# Patient Record
Sex: Male | Born: 1960 | Race: White | Hispanic: No | Smoking: Former smoker
Health system: Southern US, Community
[De-identification: ages and names within clinical notes are randomized; demographics above are authoritative.]

## PROBLEM LIST (undated history)

## (undated) DIAGNOSIS — G8929 Other chronic pain: Secondary | ICD-10-CM

## (undated) DIAGNOSIS — B372 Candidiasis of skin and nail: Secondary | ICD-10-CM

## (undated) DIAGNOSIS — E785 Hyperlipidemia, unspecified: Secondary | ICD-10-CM

## (undated) DIAGNOSIS — E7800 Pure hypercholesterolemia, unspecified: Secondary | ICD-10-CM

## (undated) DIAGNOSIS — IMO0002 Reserved for concepts with insufficient information to code with codable children: Secondary | ICD-10-CM

## (undated) DIAGNOSIS — R51 Headache: Secondary | ICD-10-CM

## (undated) DIAGNOSIS — K436 Other and unspecified ventral hernia with obstruction, without gangrene: Secondary | ICD-10-CM

## (undated) DIAGNOSIS — I10 Essential (primary) hypertension: Secondary | ICD-10-CM

## (undated) DIAGNOSIS — E1165 Type 2 diabetes mellitus with hyperglycemia: Secondary | ICD-10-CM

## (undated) DIAGNOSIS — M25551 Pain in right hip: Secondary | ICD-10-CM

## (undated) DIAGNOSIS — G629 Polyneuropathy, unspecified: Secondary | ICD-10-CM

## (undated) DIAGNOSIS — K219 Gastro-esophageal reflux disease without esophagitis: Secondary | ICD-10-CM

## (undated) DIAGNOSIS — M5431 Sciatica, right side: Secondary | ICD-10-CM

## (undated) DIAGNOSIS — F419 Anxiety disorder, unspecified: Secondary | ICD-10-CM

## (undated) DIAGNOSIS — G40909 Epilepsy, unspecified, not intractable, without status epilepticus: Secondary | ICD-10-CM

## (undated) DIAGNOSIS — M25539 Pain in unspecified wrist: Secondary | ICD-10-CM

## (undated) DIAGNOSIS — R519 Headache, unspecified: Secondary | ICD-10-CM

## (undated) DIAGNOSIS — M5416 Radiculopathy, lumbar region: Secondary | ICD-10-CM

## (undated) DIAGNOSIS — E1140 Type 2 diabetes mellitus with diabetic neuropathy, unspecified: Secondary | ICD-10-CM

## (undated) HISTORY — DX: Other chronic pain: G89.29

## (undated) HISTORY — DX: Pure hypercholesterolemia, unspecified: E78.00

## (undated) HISTORY — DX: Pain in unspecified wrist: M25.539

## (undated) HISTORY — DX: Essential (primary) hypertension: I10

## (undated) HISTORY — DX: Candidiasis of skin and nail: B37.2

## (undated) HISTORY — DX: Hyperlipidemia, unspecified: E78.5

## (undated) HISTORY — DX: Pain in right hip: M25.551

## (undated) HISTORY — DX: Radiculopathy, lumbar region: M54.16

## (undated) HISTORY — DX: Headache, unspecified: R51.9

## (undated) HISTORY — DX: Type 2 diabetes mellitus with diabetic neuropathy, unspecified: E11.40

## (undated) HISTORY — DX: Other and unspecified ventral hernia with obstruction, without gangrene: K43.6

## (undated) HISTORY — DX: Sciatica, right side: M54.31

## (undated) HISTORY — DX: Reserved for concepts with insufficient information to code with codable children: IMO0002

## (undated) HISTORY — DX: Epilepsy, unspecified, not intractable, without status epilepticus: G40.909

## (undated) HISTORY — DX: Type 2 diabetes mellitus with hyperglycemia: E11.65

## (undated) HISTORY — DX: Polyneuropathy, unspecified: G62.9

## (undated) HISTORY — DX: Headache: R51

## (undated) HISTORY — PX: HERNIA REPAIR: SHX51

## (undated) HISTORY — DX: Gastro-esophageal reflux disease without esophagitis: K21.9

## (undated) HISTORY — DX: Anxiety disorder, unspecified: F41.9

## (undated) HISTORY — PX: OTHER SURGICAL HISTORY: SHX169

---

## 2003-07-02 ENCOUNTER — Inpatient Hospital Stay (HOSPITAL_COMMUNITY): Admission: AC | Admit: 2003-07-02 | Discharge: 2003-07-07 | Payer: Self-pay

## 2003-07-02 ENCOUNTER — Encounter: Payer: Self-pay | Admitting: Emergency Medicine

## 2015-02-23 ENCOUNTER — Encounter: Payer: Self-pay | Admitting: Critical Care Medicine

## 2015-02-24 ENCOUNTER — Ambulatory Visit (INDEPENDENT_AMBULATORY_CARE_PROVIDER_SITE_OTHER): Payer: Self-pay | Admitting: Critical Care Medicine

## 2015-02-24 ENCOUNTER — Telehealth: Payer: Self-pay | Admitting: Critical Care Medicine

## 2015-02-24 ENCOUNTER — Encounter: Payer: Self-pay | Admitting: Critical Care Medicine

## 2015-02-24 VITALS — BP 140/76 | HR 81 | Temp 98.0°F | Ht 68.5 in | Wt 253.4 lb

## 2015-02-24 DIAGNOSIS — I10 Essential (primary) hypertension: Secondary | ICD-10-CM | POA: Insufficient documentation

## 2015-02-24 DIAGNOSIS — J449 Chronic obstructive pulmonary disease, unspecified: Secondary | ICD-10-CM

## 2015-02-24 DIAGNOSIS — R51 Headache: Secondary | ICD-10-CM

## 2015-02-24 DIAGNOSIS — E1140 Type 2 diabetes mellitus with diabetic neuropathy, unspecified: Secondary | ICD-10-CM | POA: Insufficient documentation

## 2015-02-24 DIAGNOSIS — G40909 Epilepsy, unspecified, not intractable, without status epilepticus: Secondary | ICD-10-CM | POA: Insufficient documentation

## 2015-02-24 DIAGNOSIS — E1165 Type 2 diabetes mellitus with hyperglycemia: Secondary | ICD-10-CM | POA: Insufficient documentation

## 2015-02-24 DIAGNOSIS — I2510 Atherosclerotic heart disease of native coronary artery without angina pectoris: Secondary | ICD-10-CM

## 2015-02-24 DIAGNOSIS — F17200 Nicotine dependence, unspecified, uncomplicated: Secondary | ICD-10-CM | POA: Insufficient documentation

## 2015-02-24 DIAGNOSIS — M5416 Radiculopathy, lumbar region: Secondary | ICD-10-CM

## 2015-02-24 DIAGNOSIS — G8929 Other chronic pain: Secondary | ICD-10-CM | POA: Insufficient documentation

## 2015-02-24 DIAGNOSIS — K219 Gastro-esophageal reflux disease without esophagitis: Secondary | ICD-10-CM

## 2015-02-24 DIAGNOSIS — E119 Type 2 diabetes mellitus without complications: Secondary | ICD-10-CM | POA: Insufficient documentation

## 2015-02-24 DIAGNOSIS — IMO0002 Reserved for concepts with insufficient information to code with codable children: Secondary | ICD-10-CM | POA: Insufficient documentation

## 2015-02-24 DIAGNOSIS — E7800 Pure hypercholesterolemia, unspecified: Secondary | ICD-10-CM

## 2015-02-24 DIAGNOSIS — G629 Polyneuropathy, unspecified: Secondary | ICD-10-CM | POA: Insufficient documentation

## 2015-02-24 DIAGNOSIS — G44049 Chronic paroxysmal hemicrania, not intractable: Secondary | ICD-10-CM

## 2015-02-24 DIAGNOSIS — E785 Hyperlipidemia, unspecified: Secondary | ICD-10-CM | POA: Insufficient documentation

## 2015-02-24 HISTORY — DX: Nicotine dependence, unspecified, uncomplicated: F17.200

## 2015-02-24 MED ORDER — BUDESONIDE-FORMOTEROL FUMARATE 160-4.5 MCG/ACT IN AERO: 2.0000 | INHALATION_SPRAY | Freq: Two times a day (BID) | RESPIRATORY_TRACT | Status: DC

## 2015-02-24 MED ORDER — ALBUTEROL SULFATE HFA 108 (90 BASE) MCG/ACT IN AERS: 2.0000 | INHALATION_SPRAY | Freq: Four times a day (QID) | RESPIRATORY_TRACT | Status: DC | PRN

## 2015-02-24 NOTE — Telephone Encounter (Signed)
Medication has been added to pt's medication list. Nothing further was needed. 

## 2015-02-24 NOTE — Progress Notes (Addendum)
Subjective:    Patient ID: Samuel Bailey, male    DOB: [birth year], 54 y.o.   MRN: [id number]  HPI Comments: Dyspnea cough for years. Long term Copd dx.  On 2L oxygen qhs only  x years.   Pt is now disabled d/t spine issues.  [employer] worker   Cath two weeks ago neg.  Weight is up  Shortness of Breath This is a chronic problem. The current episode started more than 1 year ago. The problem occurs constantly (pt will get dyspneic at rest and exertion.  177ft or less gets dyspneic. ). The problem has been waxing and waning. Associated symptoms include chest pain, rhinorrhea, sputum production and wheezing. Pertinent negatives include no abdominal pain, claudication, coryza, ear pain, fever, headaches, hemoptysis, leg pain, leg swelling or sore throat. The symptoms are aggravated by smoke, weather changes, fumes, odors, emotional upset, lying flat, any activity and exercise. Associated symptoms comments: Mucus is yellow . Risk factors include smoking (no smoking since 02/19/15). He has tried ipratropium inhalers, steroid inhalers and beta agonist inhalers for the symptoms. The treatment provided significant relief. His past medical history is significant for COPD and pneumonia. There is no history of allergies, aspirin allergies, asthma, bronchiolitis, CAD, chronic lung disease, DVT, a heart failure, PE or a recent surgery. (Cva, clean 31yrs, was using cocaine)   Past Medical History  Diagnosis Date  . Uncontrolled type 2 diabetes with neuropathy   . Wrist pain   . HTN (hypertension)   . Candidal intertrigo   . Lumbar radiculopathy   . Right hip pain   . Chronic pain   . Hyperlipemia   . Sciatica of right side   . Peripheral neuropathy   . Irreducible hernia of anterior abdominal wall   . Anxiety   . Seizure disorder   . GERD (gastroesophageal reflux disease)   . High cholesterol   . Chronic headaches      Family History  Problem Relation Age of Onset  . Prostate cancer Father   . Asthma  Mother   . Hypertension Mother   . Diabetes Mother   . CVA Mother   . Heart disease Mother   . Colon cancer Mother   . Diabetes Sister   . COPD Brother      History   Social History  . Marital Status: Legally Separated    Spouse Name: N/A  . Number of Children: N/A  . Years of Education: N/A   Occupational History  . Not on file.   Social History Main Topics  . Smoking status: Former Smoker -- 1.00 packs/day for 35 years    Types: Cigarettes    Quit date: 02/19/2015  . Smokeless tobacco: Current User  . Alcohol Use: 3.6 oz/week    6 Standard drinks or equivalent per week  . Drug Use: Not on file  . Sexual Activity: Not on file   Other Topics Concern  . Not on file   Social History Narrative   Separated. Lives alone.   Disabled.     Allergies  Allergen Reactions  . Lovastatin     diarrhea     No outpatient prescriptions prior to visit.   No facility-administered medications prior to visit.       Review of Systems  Constitutional: Negative for fever and unexpected weight change.  HENT: Positive for postnasal drip and rhinorrhea. Negative for ear discharge, ear pain, facial swelling, nosebleeds, sinus pressure, sneezing and sore throat.   Respiratory: Positive for  cough, sputum production, chest tightness, shortness of breath and wheezing. Negative for hemoptysis and choking.   Cardiovascular: Positive for chest pain. Negative for claudication and leg swelling.  Gastrointestinal: Negative.  Negative for abdominal pain.  Neurological: Negative for headaches.       Objective:   Physical Exam Filed Vitals:   02/24/15 0906  BP: 140/76  Pulse: 81  Temp: 98 F (36.7 C)  TempSrc: Oral  Height: 5' 8.5" (1.74 m)  Weight: 253 lb 6.4 oz (114.941 kg)  SpO2: 96%    Gen: Pleasant, well-nourished, in no distress,  normal affect  ENT: No lesions,  mouth clear,  oropharynx clear, no postnasal drip  Neck: No JVD, no TMG, no carotid bruits  Lungs: No use  of accessory muscles, no dullness to percussion, clear without rales or rhonchi  Cardiovascular: RRR, heart sounds normal, no murmur or gallops, no peripheral edema  Abdomen: soft and NT, no HSM,  BS normal  Musculoskeletal: No deformities, no cyanosis or clubbing  Neuro: alert, non focal  Skin: Warm, no lesions or rashes  No results found.  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 and long-term smoking use with recent smoking cessation No recent lung function studies available Current bronchodilator program involves several medications with similar actions with potential side effect profile increased Plan Stop Qvar in order to use steroid combined with long-acting bronchodilator Stop combivent as the ipratropium and Combivent will potentiate Spiriva with toxicity Start Proair as needed, use coupon Start Symbicort two puff twice daily Stay on spiriva A lung function study will be obtained  The patient has been advised to continue and persist with smoking cessation Note the patient is on Daliresp and has been advised to continue this medication Return 3 months      Tobacco use disorder Ongoing tobacco use Significant nicotine addiction Greater than 10 minutes smoking cessation counseling issued to the patient with recommendations to continue using nicotine replacement therapy     Updated Medication List Outpatient Encounter Prescriptions as of 02/24/2015  Medication Sig  . canagliflozin (INVOKANA) 300 MG TABS tablet Take 300 mg by mouth daily before breakfast.  . cyclobenzaprine (FLEXERIL) 10 MG tablet Take 10 mg by mouth 3 (three)  times daily as needed for muscle spasms.  . enalapril (VASOTEC) 20 MG tablet Take 20 mg by mouth daily.  Marland Kitchen. gabapentin (NEURONTIN) 300 MG capsule Take 300 mg by mouth 3 (three) times daily.  Marland Kitchen. gemfibrozil (LOPID) 600 MG tablet Take 600 mg by mouth 2 (two) times daily before a meal.  . HYDROcodone-acetaminophen (NORCO) 10-325 MG per tablet Take 1 tablet by mouth every 6 (six) hours as needed.  . insulin glargine (LANTUS) 100 unit/mL SOPN Inject 65 Units into the skin. Daily in morning.  . nitroGLYCERIN (NITROSTAT) 0.4 MG SL tablet Place 0.4 mg under the tongue every 5 (five) minutes as needed for chest pain.  Marland Kitchen. omeprazole (PRILOSEC) 20 MG capsule Take 20 mg by mouth daily.  Marland Kitchen. tiotropium (SPIRIVA) 18 MCG inhalation capsule Place 18 mcg into inhaler and inhale daily.  . [DISCONTINUED] albuterol-ipratropium (COMBIVENT) 18-103 MCG/ACT inhaler Inhale 2 puffs into the lungs every 6 (six) hours as needed for  wheezing or shortness of breath.  . [DISCONTINUED] beclomethasone (QVAR) 80 MCG/ACT inhaler Inhale 2 puffs into the lungs 2 (two) times daily.  Marland Kitchen albuterol (PROAIR HFA) 108 (90 BASE) MCG/ACT inhaler Inhale 2 puffs into the lungs every 6 (six) hours as needed for wheezing or shortness of breath.  . budesonide-formoterol (SYMBICORT) 160-4.5 MCG/ACT inhaler Inhale 2 puffs into the lungs 2 (two) times daily.

## 2015-02-24 NOTE — Patient Instructions (Signed)
Stop Qvar Stop [employer]combivent Start Proair as needed, use coupon [employer]Start Symbicort two puff twice daily Stay on spiriva A lung function study will be obtained  Return 3 months

## 2015-02-25 ENCOUNTER — Encounter: Payer: Self-pay | Admitting: Critical Care Medicine

## 2015-02-25 DIAGNOSIS — J449 Chronic obstructive pulmonary disease, unspecified: Secondary | ICD-10-CM

## 2015-02-25 DIAGNOSIS — J4489 Other specified chronic obstructive pulmonary disease: Secondary | ICD-10-CM

## 2015-02-25 DIAGNOSIS — I2510 Atherosclerotic heart disease of native coronary artery without angina pectoris: Secondary | ICD-10-CM | POA: Insufficient documentation

## 2015-02-25 HISTORY — DX: Atherosclerotic heart disease of native coronary artery without angina pectoris: I25.10

## 2015-02-25 HISTORY — DX: Other specified chronic obstructive pulmonary disease: J44.89

## 2015-02-25 HISTORY — DX: Chronic obstructive pulmonary disease, unspecified: J44.9

## 2015-02-25 NOTE — Assessment & Plan Note (Addendum)
COPD with chronic bronchitis and long-term smoking use with recent smoking cessation No recent lung function studies available Current bronchodilator program involves several medications with similar actions with potential side effect profile increased Plan Stop Qvar in order to use steroid combined with long-acting bronchodilator Stop combivent as the ipratropium and Combivent will potentiate Spiriva with toxicity Start Proair as needed, use coupon Start Symbicort two puff twice daily Stay on spiriva A lung function study will be obtained  The patient has been advised to continue and persist with smoking cessation Note the patient is on Daliresp and has been advised to continue this medication Return 3 months

## 2015-02-25 NOTE — Assessment & Plan Note (Signed)
Ongoing tobacco use Significant nicotine addiction Greater than 10 minutes smoking cessation counseling issued to the patient with recommendations to continue using nicotine replacement therapy

## 2015-03-03 LAB — PULMONARY FUNCTION TEST ARMC ONLY

## 2015-03-23 ENCOUNTER — Telehealth: Payer: Self-pay | Admitting: Critical Care Medicine

## 2015-03-23 DIAGNOSIS — J449 Chronic obstructive pulmonary disease, unspecified: Secondary | ICD-10-CM

## 2015-03-23 NOTE — Telephone Encounter (Signed)
Tell pt pfts show MILD obstruction, no emphysema Stay on inhalers.

## 2015-03-24 NOTE — Telephone Encounter (Signed)
Attempted to both numbers we have listed for the pt. 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

## 2015-03-25 NOTE — Telephone Encounter (Signed)
ATC pt's home # - received msg this # has been changed or disconnected.   ATC pt's # listed as work # - went directly to [employer] Corporationmsg stating "the person you have attempted to reach has a VM box that has not yet been set up." [facility]

## 2015-03-26 NOTE — Telephone Encounter (Signed)
ATC pt at home and work #s - received same msgs as stated below yesterday.  WCB

## 2015-03-31 NOTE — Telephone Encounter (Signed)
Spoke with pt one # provided below by [place].  Discussed PFT results and recs per Dr. Delford FieldWright.  He verbalized understanding and voiced no further questions or concerns at this time.  Confirmed pt's contact information and updated this in chart.

## 2015-03-31 NOTE — Telephone Encounter (Signed)
Per [place] office, they have pt's contact # as [phone] with this same # as Emergency Contact..  Will call pt at this #

## 2015-03-31 NOTE — Telephone Encounter (Signed)
ATC pt at home and work # - received same msgs as stated below. ATC pt's Emergency Contact, Jennette DubinShirley Thomas, at [phone], Received msg "Your call cannot be completed as dialed.  Please check the # and try your call again" x 2. As this is an [place] pt, will check with [place] office to see if they have a different contact # before mailing letter to pt.

## 2015-04-16 ENCOUNTER — Encounter: Payer: Self-pay | Admitting: Critical Care Medicine

## 2015-08-04 ENCOUNTER — Encounter: Payer: Self-pay | Admitting: Critical Care Medicine

## 2015-08-04 ENCOUNTER — Ambulatory Visit (INDEPENDENT_AMBULATORY_CARE_PROVIDER_SITE_OTHER): Payer: Self-pay | Admitting: Critical Care Medicine

## 2015-08-04 VITALS — BP 122/70 | HR 90 | Temp 96.8°F | Ht 68.0 in | Wt 253.0 lb

## 2015-08-04 DIAGNOSIS — F17200 Nicotine dependence, unspecified, uncomplicated: Secondary | ICD-10-CM

## 2015-08-04 DIAGNOSIS — J449 Chronic obstructive pulmonary disease, unspecified: Secondary | ICD-10-CM

## 2015-08-04 NOTE — Assessment & Plan Note (Signed)
Chronic obstructive lung disease with associated chronic bronchitis improved with smoking cessation Plan Stop symbicort when current inhaler runs out Stay on spiriva and daliresp Return 6 months

## 2015-08-04 NOTE — Patient Instructions (Signed)
Ok to use mucinex as needed Stop symbicort when current inhaler runs out Stay on spiriva and daliresp Return 6 months

## 2015-08-04 NOTE — Assessment & Plan Note (Signed)
The patient successfully quit smoking with nicotine replacement therapy Patient to maintain nicotine replacement therapy for now on taper off

## 2015-08-04 NOTE — Progress Notes (Signed)
Subjective:    Patient ID: Samuel Bailey, male    DOB: Nov 26, 1961, 54 y.o.   MRN: [id number]  HPI 08/04/2015 Chief Complaint  Patient presents with  . Follow-up    Quit smoking in Feb. 2016.Sob slightly better,able to do more activity.Cough-green,denies cp or tightness. No wheezing.   Overall is better, but notes more mucus and wheezes.  The patient did stop smoking The patient maintain Symbicort Spiriva and Daliresp and maintains nicotine replacement therapy  Pt denies any significant sore throat, nasal congestion or excess secretions, fever, chills, sweats, unintended weight loss, pleurtic or exertional chest pain, orthopnea PND, or leg swelling Pt denies any increase in rescue therapy over baseline, denies waking up needing it or having any early am or nocturnal exacerbations of coughing/wheezing/or dyspnea. Pt also denies any obvious fluctuation in symptoms with  weather or environmental change or other alleviating or aggravating factors   Current Medications, Allergies, Complete Past Medical History, Past Surgical History, Family History, and Social History were reviewed in [employer] [facility] electronic medical record per todays encounter:  08/04/2015   Review of Systems  Constitutional: Positive for fever and fatigue.  HENT: Negative.  Negative for ear pain, postnasal drip, rhinorrhea, sinus pressure, sore throat, trouble swallowing and voice change.   Eyes: Negative.   Respiratory: Positive for cough, shortness of breath and wheezing. Negative for apnea, choking, chest tightness and stridor.   Cardiovascular: Negative.  Negative for chest pain, palpitations and leg swelling.  Gastrointestinal: Negative.  Negative for nausea, vomiting, abdominal pain and abdominal distention.  Genitourinary: Negative.   Musculoskeletal: Negative.  Negative for myalgias and arthralgias.  Skin: Negative.  Negative for rash.  Allergic/Immunologic: Negative.  Negative for environmental allergies and  food allergies.  Neurological: Negative.  Negative for dizziness, syncope, weakness and headaches.  Hematological: Negative.  Negative for adenopathy. Does not bruise/bleed easily.  Psychiatric/Behavioral: Negative.  Negative for sleep disturbance and agitation. The patient is not nervous/anxious.        Objective:   Physical Exam Filed Vitals:   08/04/15 0920  BP: 122/70  Pulse: 90  Temp: 96.8 F (36 C)  TempSrc: Oral  Height: 5\' 8"  (1.727 m)  SpO2: 94%    Gen: Pleasant, well-nourished, in no distress,  normal affect  ENT: No lesions,  mouth clear,  oropharynx clear, no postnasal drip  Neck: No JVD, no TMG, no carotid bruits  Lungs: No use of accessory muscles, no dullness to percussion, distant bs  Cardiovascular: RRR, heart sounds normal, no murmur or gallops, no peripheral edema  Abdomen: soft and NT, no HSM,  BS normal  Musculoskeletal: No deformities, no cyanosis or clubbing  Neuro: alert, non focal  Skin: Warm, no lesions or rashes  No results found.        Assessment & Plan:  I personally reviewed all images and lab data in the [facility] system as well as any outside material available during this office visit and agree with the  radiology impressions.   COPD with chronic bronchitis Chronic obstructive lung disease with associated chronic bronchitis improved with smoking cessation Plan Stop symbicort when current inhaler runs out Stay on spiriva and daliresp Return 6 months   Tobacco use disorder The patient successfully quit smoking with nicotine replacement therapy Patient to maintain nicotine replacement therapy for now on taper off   Samuel Bailey was seen today for follow-up.  Diagnoses and all orders for this visit:  COPD with chronic bronchitis  Tobacco use disorder    I  had an extended discussion with the patient and or family lasting 10 minutes of a 25 minute visit including:  Smoking cessation

## 2017-08-04 ENCOUNTER — Encounter: Payer: Self-pay | Admitting: Neurology

## 2017-09-09 DIAGNOSIS — Z8673 Personal history of transient ischemic attack (TIA), and cerebral infarction without residual deficits: Secondary | ICD-10-CM | POA: Diagnosis not present

## 2017-09-09 DIAGNOSIS — E669 Obesity, unspecified: Secondary | ICD-10-CM | POA: Diagnosis not present

## 2017-09-09 DIAGNOSIS — Z87891 Personal history of nicotine dependence: Secondary | ICD-10-CM | POA: Diagnosis not present

## 2017-09-09 DIAGNOSIS — E1165 Type 2 diabetes mellitus with hyperglycemia: Secondary | ICD-10-CM

## 2017-09-09 DIAGNOSIS — E860 Dehydration: Secondary | ICD-10-CM | POA: Diagnosis not present

## 2017-09-09 DIAGNOSIS — F1290 Cannabis use, unspecified, uncomplicated: Secondary | ICD-10-CM | POA: Diagnosis not present

## 2017-09-09 DIAGNOSIS — Z8711 Personal history of peptic ulcer disease: Secondary | ICD-10-CM | POA: Diagnosis not present

## 2017-09-09 DIAGNOSIS — R55 Syncope and collapse: Secondary | ICD-10-CM

## 2017-09-09 DIAGNOSIS — R079 Chest pain, unspecified: Secondary | ICD-10-CM

## 2017-09-09 DIAGNOSIS — G8929 Other chronic pain: Secondary | ICD-10-CM | POA: Diagnosis not present

## 2017-09-09 DIAGNOSIS — E1140 Type 2 diabetes mellitus with diabetic neuropathy, unspecified: Secondary | ICD-10-CM

## 2017-09-10 DIAGNOSIS — E1165 Type 2 diabetes mellitus with hyperglycemia: Secondary | ICD-10-CM | POA: Diagnosis not present

## 2017-09-10 DIAGNOSIS — E1140 Type 2 diabetes mellitus with diabetic neuropathy, unspecified: Secondary | ICD-10-CM | POA: Diagnosis not present

## 2017-09-10 DIAGNOSIS — R55 Syncope and collapse: Secondary | ICD-10-CM | POA: Diagnosis not present

## 2017-09-10 DIAGNOSIS — R079 Chest pain, unspecified: Secondary | ICD-10-CM | POA: Diagnosis not present

## 2017-09-11 DIAGNOSIS — E1165 Type 2 diabetes mellitus with hyperglycemia: Secondary | ICD-10-CM | POA: Diagnosis not present

## 2017-09-11 DIAGNOSIS — E1140 Type 2 diabetes mellitus with diabetic neuropathy, unspecified: Secondary | ICD-10-CM | POA: Diagnosis not present

## 2017-09-11 DIAGNOSIS — R55 Syncope and collapse: Secondary | ICD-10-CM | POA: Diagnosis not present

## 2017-09-11 DIAGNOSIS — R079 Chest pain, unspecified: Secondary | ICD-10-CM | POA: Diagnosis not present

## 2017-11-03 ENCOUNTER — Ambulatory Visit: Payer: Self-pay | Admitting: Neurology

## 2017-12-02 DIAGNOSIS — E1165 Type 2 diabetes mellitus with hyperglycemia: Secondary | ICD-10-CM

## 2017-12-02 DIAGNOSIS — E669 Obesity, unspecified: Secondary | ICD-10-CM | POA: Diagnosis not present

## 2017-12-02 DIAGNOSIS — E1140 Type 2 diabetes mellitus with diabetic neuropathy, unspecified: Secondary | ICD-10-CM

## 2017-12-02 DIAGNOSIS — M79671 Pain in right foot: Secondary | ICD-10-CM

## 2017-12-02 DIAGNOSIS — L03115 Cellulitis of right lower limb: Secondary | ICD-10-CM

## 2017-12-03 DIAGNOSIS — E1165 Type 2 diabetes mellitus with hyperglycemia: Secondary | ICD-10-CM | POA: Diagnosis not present

## 2017-12-03 DIAGNOSIS — L03115 Cellulitis of right lower limb: Secondary | ICD-10-CM | POA: Diagnosis not present

## 2017-12-03 DIAGNOSIS — L0390 Cellulitis, unspecified: Secondary | ICD-10-CM | POA: Diagnosis not present

## 2017-12-03 DIAGNOSIS — E1140 Type 2 diabetes mellitus with diabetic neuropathy, unspecified: Secondary | ICD-10-CM | POA: Diagnosis not present

## 2017-12-03 DIAGNOSIS — M79671 Pain in right foot: Secondary | ICD-10-CM | POA: Diagnosis not present

## 2017-12-04 DIAGNOSIS — M79671 Pain in right foot: Secondary | ICD-10-CM | POA: Diagnosis not present

## 2017-12-04 DIAGNOSIS — E1140 Type 2 diabetes mellitus with diabetic neuropathy, unspecified: Secondary | ICD-10-CM | POA: Diagnosis not present

## 2017-12-04 DIAGNOSIS — E1165 Type 2 diabetes mellitus with hyperglycemia: Secondary | ICD-10-CM | POA: Diagnosis not present

## 2017-12-04 DIAGNOSIS — L03115 Cellulitis of right lower limb: Secondary | ICD-10-CM | POA: Diagnosis not present

## 2018-02-09 ENCOUNTER — Ambulatory Visit: Payer: Self-pay | Admitting: Neurology

## 2018-05-25 ENCOUNTER — Ambulatory Visit: Payer: Self-pay | Admitting: Neurology

## 2019-10-30 DIAGNOSIS — R079 Chest pain, unspecified: Secondary | ICD-10-CM

## 2019-10-31 ENCOUNTER — Encounter (HOSPITAL_COMMUNITY)
Admission: AD | Disposition: A | Discharge status: Home / Self Care | Payer: Self-pay | Source: Other Acute Inpatient Hospital | Attending: Cardiology

## 2019-10-31 ENCOUNTER — Ambulatory Visit (HOSPITAL_COMMUNITY): Admission: RE | Admit: 2019-10-31 | Payer: Medicaid Other | Source: Ambulatory Visit | Admitting: Cardiology

## 2019-10-31 ENCOUNTER — Encounter (HOSPITAL_COMMUNITY): Payer: Self-pay | Admitting: Student

## 2019-10-31 ENCOUNTER — Observation Stay (HOSPITAL_COMMUNITY)
Admission: AD | Admit: 2019-10-31 | Discharge: 2019-10-31 | Disposition: A | Discharge status: Home / Self Care | Payer: Medicaid Other | Source: Other Acute Inpatient Hospital | Attending: Cardiology | Admitting: Cardiology

## 2019-10-31 ENCOUNTER — Observation Stay (HOSPITAL_BASED_OUTPATIENT_CLINIC_OR_DEPARTMENT_OTHER): Payer: Medicaid Other

## 2019-10-31 DIAGNOSIS — Z20828 Contact with and (suspected) exposure to other viral communicable diseases: Secondary | ICD-10-CM | POA: Diagnosis not present

## 2019-10-31 DIAGNOSIS — Z87891 Personal history of nicotine dependence: Secondary | ICD-10-CM | POA: Diagnosis not present

## 2019-10-31 DIAGNOSIS — G4730 Sleep apnea, unspecified: Secondary | ICD-10-CM | POA: Diagnosis not present

## 2019-10-31 DIAGNOSIS — R079 Chest pain, unspecified: Secondary | ICD-10-CM

## 2019-10-31 DIAGNOSIS — Z79899 Other long term (current) drug therapy: Secondary | ICD-10-CM | POA: Diagnosis not present

## 2019-10-31 DIAGNOSIS — J449 Chronic obstructive pulmonary disease, unspecified: Secondary | ICD-10-CM | POA: Diagnosis present

## 2019-10-31 DIAGNOSIS — E1140 Type 2 diabetes mellitus with diabetic neuropathy, unspecified: Secondary | ICD-10-CM | POA: Insufficient documentation

## 2019-10-31 DIAGNOSIS — L03115 Cellulitis of right lower limb: Secondary | ICD-10-CM

## 2019-10-31 DIAGNOSIS — IMO0002 Reserved for concepts with insufficient information to code with codable children: Secondary | ICD-10-CM | POA: Diagnosis present

## 2019-10-31 DIAGNOSIS — E1165 Type 2 diabetes mellitus with hyperglycemia: Secondary | ICD-10-CM | POA: Diagnosis present

## 2019-10-31 DIAGNOSIS — E119 Type 2 diabetes mellitus without complications: Secondary | ICD-10-CM | POA: Diagnosis present

## 2019-10-31 DIAGNOSIS — I10 Essential (primary) hypertension: Secondary | ICD-10-CM | POA: Insufficient documentation

## 2019-10-31 DIAGNOSIS — I2510 Atherosclerotic heart disease of native coronary artery without angina pectoris: Secondary | ICD-10-CM | POA: Insufficient documentation

## 2019-10-31 DIAGNOSIS — R072 Precordial pain: Secondary | ICD-10-CM | POA: Diagnosis not present

## 2019-10-31 DIAGNOSIS — Z794 Long term (current) use of insulin: Secondary | ICD-10-CM | POA: Insufficient documentation

## 2019-10-31 DIAGNOSIS — G40909 Epilepsy, unspecified, not intractable, without status epilepticus: Secondary | ICD-10-CM | POA: Diagnosis not present

## 2019-10-31 DIAGNOSIS — E7800 Pure hypercholesterolemia, unspecified: Secondary | ICD-10-CM | POA: Insufficient documentation

## 2019-10-31 DIAGNOSIS — Z8673 Personal history of transient ischemic attack (TIA), and cerebral infarction without residual deficits: Secondary | ICD-10-CM | POA: Insufficient documentation

## 2019-10-31 DIAGNOSIS — E785 Hyperlipidemia, unspecified: Secondary | ICD-10-CM | POA: Insufficient documentation

## 2019-10-31 DIAGNOSIS — K219 Gastro-esophageal reflux disease without esophagitis: Secondary | ICD-10-CM | POA: Diagnosis not present

## 2019-10-31 HISTORY — DX: Chest pain, unspecified: R07.9

## 2019-10-31 HISTORY — DX: Cellulitis of right lower limb: L03.115

## 2019-10-31 HISTORY — PX: LEFT HEART CATH AND CORONARY ANGIOGRAPHY: CATH118249

## 2019-10-31 LAB — BASIC METABOLIC PANEL
Anion gap: 11 (ref 5–15)
BUN: 9 mg/dL (ref 6–20)
CO2: 26 mmol/L (ref 22–32)
Calcium: 8.9 mg/dL (ref 8.9–10.3)
Chloride: 101 mmol/L (ref 98–111)
Creatinine, Ser: 0.89 mg/dL (ref 0.61–1.24)
GFR calc Af Amer: >60 mL/min (ref >60)
GFR calc non Af Amer: >60 mL/min (ref >60)
Glucose, Bld: 191 mg/dL — ABNORMAL HIGH (ref 70–99)
Potassium: 3.8 mmol/L (ref 3.5–5.1)
Sodium: 138 mmol/L (ref 135–145)

## 2019-10-31 LAB — CBC
HCT: 37.4 % — ABNORMAL LOW (ref 39.0–52.0)
Hemoglobin: 12.5 g/dL — ABNORMAL LOW (ref 13.0–17.0)
MCH: 29 pg (ref 26.0–34.0)
MCHC: 33.4 g/dL (ref 30.0–36.0)
MCV: 86.8 fL (ref 80.0–100.0)
Platelets: 238 10*3/uL (ref 150–400)
RBC: 4.31 MIL/uL (ref 4.22–5.81)
RDW: 12.6 % (ref 11.5–15.5)
WBC: 10.4 10*3/uL (ref 4.0–10.5)
nRBC: 0 % (ref 0.0–0.2)

## 2019-10-31 LAB — TROPONIN I (HIGH SENSITIVITY)
Troponin I (High Sensitivity): 5 ng/L (ref <18)
Troponin I (High Sensitivity): 5 ng/L (ref <18)

## 2019-10-31 LAB — LIPID PANEL
Cholesterol: 113 mg/dL (ref 0–200)
HDL: 28 mg/dL — ABNORMAL LOW (ref >40)
LDL Cholesterol: 54 mg/dL (ref 0–99)
Total CHOL/HDL Ratio: 4 RATIO
Triglycerides: 155 mg/dL — ABNORMAL HIGH (ref <150)
VLDL: 31 mg/dL (ref 0–40)

## 2019-10-31 LAB — ECHOCARDIOGRAM COMPLETE
Height: 68 in
Weight: 3723.2 oz

## 2019-10-31 LAB — HEMOGLOBIN A1C
Hgb A1c MFr Bld: 12.7 % — ABNORMAL HIGH (ref 4.8–5.6)
Mean Plasma Glucose: 317.79 mg/dL

## 2019-10-31 LAB — GLUCOSE, CAPILLARY
Glucose-Capillary: 163 mg/dL — ABNORMAL HIGH (ref 70–99)
Glucose-Capillary: 210 mg/dL — ABNORMAL HIGH (ref 70–99)

## 2019-10-31 LAB — SARS CORONAVIRUS 2 (TAT 6-24 HRS): SARS Coronavirus 2: NEGATIVE

## 2019-10-31 SURGERY — LEFT HEART CATH AND CORONARY ANGIOGRAPHY
Anesthesia: LOCAL

## 2019-10-31 MED ORDER — NITROGLYCERIN 0.4 MG SL SUBL: 0.4000 mg | SUBLINGUAL_TABLET | SUBLINGUAL | Status: DC | PRN

## 2019-10-31 MED ORDER — SODIUM CHLORIDE 0.9 % WEIGHT BASED INFUSION
3.0000 mL/kg/h | INTRAVENOUS | Status: AC
  Administered 2019-10-31: 3 mL/kg/h via INTRAVENOUS

## 2019-10-31 MED ORDER — SODIUM CHLORIDE 0.9% FLUSH: 3.0000 mL | INTRAVENOUS | Status: DC | PRN

## 2019-10-31 MED ORDER — HEPARIN (PORCINE) IN NACL 1000-0.9 UT/500ML-% IV SOLN
INTRAVENOUS | Status: DC | PRN
  Administered 2019-10-31 (×2): 500 mL

## 2019-10-31 MED ORDER — ACETAMINOPHEN 325 MG PO TABS
650.0000 mg | ORAL_TABLET | ORAL | Status: DC | PRN
  Administered 2019-10-31: 650 mg via ORAL
  Filled 2019-10-31: qty 2

## 2019-10-31 MED ORDER — PANTOPRAZOLE SODIUM 40 MG PO TBEC
40.0000 mg | DELAYED_RELEASE_TABLET | Freq: Every day | ORAL | Status: DC
  Administered 2019-10-31: 40 mg via ORAL
  Filled 2019-10-31: qty 1

## 2019-10-31 MED ORDER — HEPARIN SODIUM (PORCINE) 1000 UNIT/ML IJ SOLN
INTRAMUSCULAR | Status: AC
  Filled 2019-10-31: qty 1

## 2019-10-31 MED ORDER — SODIUM CHLORIDE 0.9% FLUSH
3.0000 mL | Freq: Two times a day (BID) | INTRAVENOUS | Status: DC
  Administered 2019-10-31: 3 mL via INTRAVENOUS

## 2019-10-31 MED ORDER — SODIUM CHLORIDE 0.9 % WEIGHT BASED INFUSION
1.0000 mL/kg/h | INTRAVENOUS | Status: AC
  Administered 2019-10-31: 1 mL/kg/h via INTRAVENOUS

## 2019-10-31 MED ORDER — HEPARIN (PORCINE) IN NACL 1000-0.9 UT/500ML-% IV SOLN
INTRAVENOUS | Status: AC
  Filled 2019-10-31: qty 1000

## 2019-10-31 MED ORDER — HEPARIN (PORCINE) 25000 UT/250ML-% IV SOLN
1550.0000 [IU]/h | INTRAVENOUS | Status: DC
  Administered 2019-10-31: 1550 [IU]/h via INTRAVENOUS
  Filled 2019-10-31: qty 250

## 2019-10-31 MED ORDER — DOXYCYCLINE HYCLATE 100 MG PO TABS
100.0000 mg | ORAL_TABLET | Freq: Two times a day (BID) | ORAL | Status: DC
  Administered 2019-10-31: 100 mg via ORAL
  Filled 2019-10-31: qty 1

## 2019-10-31 MED ORDER — SODIUM CHLORIDE 0.9% FLUSH: 3.0000 mL | Freq: Two times a day (BID) | INTRAVENOUS | Status: DC

## 2019-10-31 MED ORDER — ATORVASTATIN CALCIUM 10 MG PO TABS: 20.0000 mg | ORAL_TABLET | Freq: Every day | ORAL | Status: DC

## 2019-10-31 MED ORDER — IOHEXOL 350 MG/ML SOLN
INTRAVENOUS | Status: DC | PRN
  Administered 2019-10-31: 14:00:00 50 mL

## 2019-10-31 MED ORDER — SODIUM CHLORIDE 0.9 % IV SOLN: 250.0000 mL | INTRAVENOUS | Status: DC | PRN

## 2019-10-31 MED ORDER — GABAPENTIN 300 MG PO CAPS
300.0000 mg | ORAL_CAPSULE | Freq: Three times a day (TID) | ORAL | Status: DC
  Administered 2019-10-31 (×2): 300 mg via ORAL
  Filled 2019-10-31 (×2): qty 1

## 2019-10-31 MED ORDER — VERAPAMIL HCL 2.5 MG/ML IV SOLN
INTRAVENOUS | Status: AC
  Filled 2019-10-31: qty 2

## 2019-10-31 MED ORDER — LIDOCAINE HCL (PF) 1 % IJ SOLN
INTRAMUSCULAR | Status: AC
  Filled 2019-10-31: qty 30

## 2019-10-31 MED ORDER — ONDANSETRON HCL 4 MG/2ML IJ SOLN: 4.0000 mg | Freq: Four times a day (QID) | INTRAMUSCULAR | Status: DC | PRN

## 2019-10-31 MED ORDER — LIDOCAINE HCL (PF) 1 % IJ SOLN
INTRAMUSCULAR | Status: DC | PRN
  Administered 2019-10-31: 2 mL

## 2019-10-31 MED ORDER — FENTANYL CITRATE (PF) 100 MCG/2ML IJ SOLN
INTRAMUSCULAR | Status: AC
  Filled 2019-10-31: qty 2

## 2019-10-31 MED ORDER — HEPARIN SODIUM (PORCINE) 1000 UNIT/ML IJ SOLN
INTRAMUSCULAR | Status: DC | PRN
  Administered 2019-10-31: 5000 [IU] via INTRAVENOUS

## 2019-10-31 MED ORDER — FLUOXETINE HCL 20 MG PO CAPS
20.0000 mg | ORAL_CAPSULE | Freq: Every day | ORAL | Status: DC
  Administered 2019-10-31: 20 mg via ORAL
  Filled 2019-10-31: qty 1

## 2019-10-31 MED ORDER — AMOXICILLIN-POT CLAVULANATE 875-125 MG PO TABS
1.0000 | ORAL_TABLET | Freq: Two times a day (BID) | ORAL | Status: DC
  Administered 2019-10-31: 1 via ORAL
  Filled 2019-10-31: qty 1

## 2019-10-31 MED ORDER — ATORVASTATIN CALCIUM 40 MG PO TABS: 40.0000 mg | ORAL_TABLET | Freq: Every day | ORAL | Status: DC

## 2019-10-31 MED ORDER — MIDAZOLAM HCL 2 MG/2ML IJ SOLN
INTRAMUSCULAR | Status: AC
  Filled 2019-10-31: qty 2

## 2019-10-31 MED ORDER — INSULIN GLARGINE 100 UNIT/ML ~~LOC~~ SOLN
15.0000 [IU] | Freq: Every day | SUBCUTANEOUS | Status: DC
  Filled 2019-10-31: qty 0.15

## 2019-10-31 MED ORDER — SODIUM CHLORIDE 0.9 % IV SOLN
INTRAVENOUS | Status: AC | PRN
  Administered 2019-10-31: 10 mL/h via INTRAVENOUS

## 2019-10-31 MED ORDER — ASPIRIN EC 81 MG PO TBEC
81.0000 mg | DELAYED_RELEASE_TABLET | Freq: Every day | ORAL | Status: DC
  Filled 2019-10-31: qty 1

## 2019-10-31 MED ORDER — MIDAZOLAM HCL 2 MG/2ML IJ SOLN
INTRAMUSCULAR | Status: DC | PRN
  Administered 2019-10-31: 1 mg via INTRAVENOUS

## 2019-10-31 MED ORDER — FENTANYL CITRATE (PF) 100 MCG/2ML IJ SOLN
INTRAMUSCULAR | Status: DC | PRN
  Administered 2019-10-31: 25 ug via INTRAVENOUS

## 2019-10-31 MED ORDER — ASPIRIN 81 MG PO TBEC: 81.0000 mg | DELAYED_RELEASE_TABLET | Freq: Every day | ORAL | Status: DC

## 2019-10-31 MED ORDER — INSULIN ASPART 100 UNIT/ML ~~LOC~~ SOLN
0.0000 [IU] | Freq: Three times a day (TID) | SUBCUTANEOUS | Status: DC
  Administered 2019-10-31: 3 [IU] via SUBCUTANEOUS

## 2019-10-31 MED ORDER — SODIUM CHLORIDE 0.9 % WEIGHT BASED INFUSION
1.0000 mL/kg/h | INTRAVENOUS | Status: DC
  Administered 2019-10-31: 1 mL/kg/h via INTRAVENOUS

## 2019-10-31 MED ORDER — TIOTROPIUM BROMIDE MONOHYDRATE 18 MCG IN CAPS: 18.0000 ug | ORAL_CAPSULE | Freq: Every day | RESPIRATORY_TRACT | Status: DC

## 2019-10-31 MED ORDER — ASPIRIN 81 MG PO CHEW
81.0000 mg | CHEWABLE_TABLET | ORAL | Status: AC
  Administered 2019-10-31: 81 mg via ORAL
  Filled 2019-10-31: qty 1

## 2019-10-31 MED ORDER — VERAPAMIL HCL 2.5 MG/ML IV SOLN
INTRAVENOUS | Status: DC | PRN
  Administered 2019-10-31: 10 mL via INTRA_ARTERIAL

## 2019-10-31 MED ORDER — UMECLIDINIUM BROMIDE 62.5 MCG/INH IN AEPB
1.0000 | INHALATION_SPRAY | Freq: Every day | RESPIRATORY_TRACT | Status: DC
  Filled 2019-10-31: qty 7

## 2019-10-31 MED ORDER — ROFLUMILAST 500 MCG PO TABS
500.0000 ug | ORAL_TABLET | Freq: Every day | ORAL | Status: DC
  Administered 2019-10-31: 500 ug via ORAL
  Filled 2019-10-31: qty 1

## 2019-10-31 SURGICAL SUPPLY — 9 items
CATH 5FR JL3.5 JR4 ANG PIG MP (CATHETERS) ×2 IMPLANT
DEVICE RAD COMP TR BAND LRG (VASCULAR PRODUCTS) ×2 IMPLANT
GLIDESHEATH SLEND SS 6F .021 (SHEATH) ×2 IMPLANT
GUIDEWIRE INQWIRE 1.5J.035X260 (WIRE) ×1 IMPLANT
INQWIRE 1.5J .035X260CM (WIRE) ×2
KIT HEART LEFT (KITS) ×2 IMPLANT
PACK CARDIAC CATHETERIZATION (CUSTOM PROCEDURE TRAY) ×2 IMPLANT
TRANSDUCER W/STOPCOCK (MISCELLANEOUS) ×2 IMPLANT
TUBING CIL FLEX 10 FLL-RA (TUBING) ×2 IMPLANT

## 2019-10-31 NOTE — Progress Notes (Signed)
ANTICOAGULATION CONSULT NOTE - Initial Consult  Pharmacy Consult for heparin  Indication: chest pain/ACS  Allergies  Allergen Reactions  . Lovastatin     diarrhea    Patient Measurements: Height: 5\' 8"  (172.7 cm) Weight: 232 lb 11.2 oz (105.6 kg) IBW/kg (Calculated) : 68.4 Heparin Dosing Weight:92kg  Vital Signs: Temp: 98 F (36.7 C) (11/12 0431) Temp Source: Oral (11/12 0431) BP: 131/80 (11/12 0431) Pulse Rate: 79 (11/12 0431)  Labs: Recent Labs    10/31/19 0432 10/31/19 0636  HGB 12.5*  --   HCT 37.4*  --   PLT 238  --   CREATININE 0.89  --   TROPONINIHS  --  5    Estimated Creatinine Clearance: 107.9 mL/min (by C-G formula based on SCr of 0.89 mg/dL).   Medical History: Past Medical History:  Diagnosis Date  . Anxiety   . Candidal intertrigo   . Chronic headaches   . Chronic pain   . GERD (gastroesophageal reflux disease)   . High cholesterol   . HTN (hypertension)   . Hyperlipemia   . Irreducible hernia of anterior abdominal wall   . Lumbar radiculopathy   . Peripheral neuropathy   . Right hip pain   . Sciatica of right side   . Seizure disorder ([place])   . Uncontrolled type 2 diabetes with neuropathy ([place])   . Wrist pain    Assessment: 58 year old male transferred from [facility]. Admitted with chest pain, transferred for continued workup with abnormal stress test.   Patient was started on IV heparin at [facility], will continue at current rate and check level as needed versus follow up after procedure. CBC this am ok, no bleeding issues noted.   Goal of Therapy:  Heparin level 0.3-0.7 units/ml Monitor platelets by anticoagulation protocol: Yes   Plan:  Continue heparin at 1550 units/hr Plan for cath today  Erin Hearing PharmD., BCPS Clinical Pharmacist 10/31/2019 8:45 AM

## 2019-10-31 NOTE — Progress Notes (Signed)
   Called and spoke with patient's primary pharmacy to confirm antibiotics: - Augmentin 875-125mg  twice daily x10 days. Last day 11/06/2019. - Doxycycline 100mg  twice daily x7 days. Last day 11/03/2019.  Darreld Mclean, PA-C 10/31/2019 8:55 AM

## 2019-10-31 NOTE — Discharge Summary (Signed)
Discharge Summary    Patient ID: Samuel Bailey MRN: [id number]; DOB: 09/19/1961  Admit date: 10/31/2019 Discharge date: 10/31/2019  Primary Care Provider: Wilmer Bailey., MD  Primary Cardiologist: Previously seen by Dr. Tomie Bailey. Will follow up in our [place] office. Primary Electrophysiologist:  None   Discharge Diagnoses    Principal Problem:   Chest pain Active Problems:   Mild CAD   Uncontrolled type 2 diabetes with neuropathy (HCC)   HTN (hypertension)   Hyperlipidemia   GERD (gastroesophageal reflux disease)   COPD with chronic bronchitis (HCC)   Cellulitis of right foot    Diagnostic Studies/Procedures    Left Heart Catheterization 10/31/2019:  Prox LAD to Mid LAD lesion is 25% stenosed.  Dist RCA lesion is 40% stenosed.  LV end diastolic pressure is normal.   1. Nonobstructive CAD 2. Normal LVEDP  Plan: medical management.   Diagnostic Dominance: Right    _____________   Echocardiogram 10/31/2019: Impressions:  1. Left ventricular ejection fraction, by visual estimation, is 60 to 65%. The left ventricle has normal function. There is borderline left ventricular hypertrophy.  2. Left ventricular diastolic parameters are indeterminate.  3. The left ventricle has no regional wall motion abnormalities.  4. Global right ventricle has normal systolic function.The right ventricular size is normal. No increase in right ventricular wall thickness.  5. Left atrial size was normal.  6. Right atrial size was normal.  7. The pericardial effusion is circumferential.  8. Trivial pericardial effusion is present.  9. Mild mitral annular calcification. 10. The mitral valve is grossly normal. Trace mitral valve regurgitation. 11. The tricuspid valve is grossly normal. Tricuspid valve regurgitation is trivial. 12. The aortic valve is tricuspid. Aortic valve regurgitation is not visualized. No evidence of aortic valve sclerosis or stenosis. 13. The pulmonic  valve was grossly normal. Pulmonic valve regurgitation is not visualized. 14. TR signal is inadequate for assessing pulmonary artery systolic pressure. 15. The inferior vena cava is dilated in size with >50% respiratory variability, suggesting right atrial pressure of 8 mmHg.  History of Present Illness     Samuel Bailey is a 58 year old male with a history of CVA, hyperlipidemia, type 2 diabetes, COPD on supplemental O2 at home, sleep apnea not compliant with CPAP, GERD but no known cardiac history.  Patient states he had a stroke about 10 years ago. He did have a cardiac catheterization in 12/2014 which showed mild CAD but states he does not follow with a Cardiologist. Per review of chart from [facility], patient recently had an admission at that facility for cellulitis of his right lower extremity for which he is still taking PO antibiotics.   He presented to the [facility] ED on 10/29/2019 for chest pain.  He reports waking up at 3 AM the morning of presentation with right arm numbness, weakness, and pain across his chest. In the [facility] ED EKG showed sinus tachycardia, rate 102 bpm, with no acute ST/T changes.  Troponin negative x3. Pro-BNP 153. Chest x-ray showed no acute findings.  Chest CTA showed no evidence of PE.  Patient was admitted for ACS rule out and underwent Lexiscan Myoview which showed a moderate area of reversibility in the mid inferior lateral wall as well as inferior wall hypokinesis.  Therefore, decision was made to transfer patient to [facility] for left heart catheterization.  At the time of this evaluation, patient resting comfortably.  He reports that he woke up at 3 AM Tuesday morning with substernal chest chest pressure  is associated right arm pain.  Nothing seems to make the pain better or worse.  He took a nitro at home with no significant improvement.  Therefore, he decided to present to the ED for further evaluation. He received more Nitro at [place] with some minimal  improvement in pain. He still reports some mild chest pain at this time. He has some baseline shortness of breath due to his COPD but denies any worsening of this.  He states he is supposed to be on home O2 24 7 but states he does not do this.  He states he does not need his oxygen as much during the winter.  He notes some difficulty breathing while laying down at night as well as some possible PND but patient does have sleep apnea and is not compliant with his CPAP.  His biggest complaint this morning is continued right foot pain.  He states he stepped on a nail about 3 months ago but recovered from that and then all of a sudden last week he began to have swelling/redness in that foot again.  He was seen at [facility] and started on Augmentin and Doxycycline on 10/27/2019.  However, he does not feel like this has helped very much.  He still has some swelling in the right foot but no other lower extremity edema.  He notes some lightheadedness dizziness mostly with sitting/standing and standing since being diagnosed with cellulitis but no syncope.  He states he initially had a mild fever at [place] but I do not see any reports of this in the chart. Afebrile here. No recent illnesses.  No abnormal bleeding.  He has a significant smoking history but quit 5 years ago.  He states he was smoked over 2 packs-per-day since he was a teenager.  He also endorses occasionally smoking a joint but denies any other alcohol use or recreational drug use.  He does have a family history of cardiovascular disease with maternal uncles and aunts having MIs in their 50s/60s.  Hospital Course     Consultants: Diabetes Coordinator  Chest Pain Patient underwent cardiac catheterization which showed mild non-obstructive CAD with 25% stenosis of proximal to mid LAD and 40% stenosis of the distal RCA. Normal LVEDP. Echocardiogram showed LVEF of 60-65% with borderline LVH, indeterminate diastolic parameters, trace MR, trivial TR,  and trivial pericardial effusion. Patient tolerated the procedure well. Will have RN ambulate with patient prior to discharge. Continue aggressive primary prevention. Continue home statin. Will add Aspirin 81mg  daily.  Hypertension Patient has history of hypertension listed in chart but patient denies this. Looks like he was on Enalapril in the past; however, not on any antihypertensives at home now. BP has been well controlled this admission so will not start anything at this time. However, may consider low dose ACEi/ARB as outpatient for renal protection given uncontrolled diabetes. Will defer to PCP.  Hyperlipidemia LDL well controlled at 54. Will continue home Lipitor 20mg  daily.   Uncontrolled Type 2 Diabetes  Hemoglobin A1c 12.5% this admission. Patient was started on sliding scale insulin and Diabetes Coordinator was consulted for assistance. Will discharge patient on home regimen. Will need to follow-up with PCP. May need outpatient referral to Endocrinology.   Lower Extremity Cellulitis Continue 10 day course of Augmentin (last day 11/06/2019) and 7 day course of Doxycycline (last day 11/03/2019) as previously prescribed. Will need close follow-up with PCP for this.  COPD Continue home inhalers.  Patient seen and examine today by Dr. 11/08/2019. Felt to  be stable for discharge once TR band is removed. Will arrange for follow-up in our [place] office. Will continue home medications as below.   Did the patient have an acute coronary syndrome (MI, NSTEMI, STEMI, etc) this admission?:  No                               Did the patient have a percutaneous coronary intervention (stent / angioplasty)?:  No.   _____________  Discharge Vitals Blood pressure 132/69, pulse 90, temperature 97.7 F (36.5 C), temperature source Oral, resp. rate 20, height 5\' 8"  (1.727 m), weight 105.6 kg, SpO2 93 %.  Filed Weights   10/31/19 0100  Weight: 105.6 kg    Labs & Radiologic Studies    CBC  Recent Labs    10/31/19 0432  WBC 10.4  HGB 12.5*  HCT 37.4*  MCV 86.8  PLT 238   Basic Metabolic Panel Recent Labs    16/09/9610/12/20 0432  NA 138  K 3.8  CL 101  CO2 26  GLUCOSE 191*  BUN 9  CREATININE 0.89  CALCIUM 8.9   Liver Function Tests No results for input(s): AST, ALT, ALKPHOS, BILITOT, PROT, ALBUMIN in the last 72 hours. No results for input(s): LIPASE, AMYLASE in the last 72 hours. High Sensitivity Troponin:   Recent Labs  Lab 10/31/19 0636 10/31/19 0810  TROPONINIHS 5 5    BNP Invalid input(s): POCBNP D-Dimer No results for input(s): DDIMER in the last 72 hours. Hemoglobin A1C Recent Labs    10/31/19 0810  HGBA1C 12.7*   Fasting Lipid Panel Recent Labs    10/31/19 0810  CHOL 113  HDL 28*  LDLCALC 54  TRIG [id number]*  CHOLHDL 4.0   Thyroid Function Tests No results for input(s): TSH, T4TOTAL, T3FREE, THYROIDAB in the last 72 hours.  Invalid input(s): FREET3 _____________  No results found. Disposition   Patient is being discharged home today in good condition.  Follow-up Plans & Appointments    Follow-up Information    Samuel Floorampbell, Stephen D., MD Follow up.   Specialty: Internal Medicine Why: Please call Dr. Blair Dolphinampbell's office as soon as possible to schedule close follow-up appointment within the next week. Contact information: [address] [place] [id number] [phone]        Tobb, Lavona MoundKardie, DO Follow up.   Specialty: Cardiology Why: You have a follow-up appointment with Dr. Servando Salinaobb in our [place] office on 11/08/2019 at 10:00am. Please arrive 15 minutes early for check-in. If this date/time does not work for you, please call our office to re-schedule. Contact information: [address] [place] [address] [phone]          Discharge Instructions    Diet - low sodium heart healthy   Complete by: As directed    Increase activity slowly   Complete by: As directed       Discharge Medications    Allergies as of 10/31/2019      Reactions   Lovastatin    diarrhea      Medication List    STOP taking these medications   albuterol 108 (90 Base) MCG/ACT inhaler Commonly known as: ProAir HFA   cyclobenzaprine 10 MG tablet Commonly known as: FLEXERIL     TAKE these medications   amoxicillin-clavulanate 875-125 MG tablet Commonly known as: AUGMENTIN Take 1 tablet by mouth 2 (two) times daily.   aspirin 81 MG EC tablet Take 1 tablet (  81 mg total) by mouth daily. Start taking on: November 01, 2019   atorvastatin 20 MG tablet Commonly known as: LIPITOR Take 20 mg by mouth daily.   doxycycline 100 MG capsule Commonly known as: VIBRAMYCIN Take 100 mg by mouth 2 (two) times daily.   FLUoxetine 20 MG tablet Commonly known as: PROZAC Take 20 mg by mouth daily as needed (anxiety).   gabapentin 300 MG capsule Commonly known as: NEURONTIN Take 300-600 mg by mouth See admin instructions. Take 300mg  in the morning and 600mg  at night.   insulin aspart 100 UNIT/ML injection Commonly known as: novoLOG Inject 3-10 Units into the skin 3 (three) times daily with meals. Per sliding scale   insulin glargine 100 unit/mL Sopn Commonly known as: LANTUS Inject 35 Units into the skin 2 (two) times daily.   Jardiance 10 MG Tabs tablet Generic drug: empagliflozin Take 10 mg by mouth daily.   nitroGLYCERIN 0.4 MG SL tablet Commonly known as: NITROSTAT Place 0.4 mg under the tongue every 5 (five) minutes as needed for chest pain.   omeprazole 40 MG capsule Commonly known as: PRILOSEC Take 40 mg by mouth daily.   roflumilast 500 MCG Tabs tablet Commonly known as: DALIRESP Take 500 mcg by mouth daily.   tiotropium 18 MCG inhalation capsule Commonly known as: SPIRIVA Place 18 mcg into inhaler and inhale daily.          Outstanding Labs/Studies   N/A  Duration of Discharge Encounter   Greater than 30 minutes including physician time.  Signed, Darreld Mclean, PA-C  10/31/2019, 3:58 PM

## 2019-10-31 NOTE — Progress Notes (Addendum)
Inpatient Diabetes Program Recommendations  AACE/ADA: New Consensus Statement on Inpatient Glycemic Control (2015)  Target Ranges:  Prepandial:   less than 140 mg/dL      Peak postprandial:   less than 180 mg/dL (1-2 hours)      Critically ill patients:  140 - 180 mg/dL   Lab Results  Component Value Date   HGBA1C 12.7 (H) 10/31/2019   Results for ATLAS, CROSSLAND (MRN [id number]) as of 10/31/2019 12:12  Ref. Range 10/31/2019 11:17  Glucose-Capillary Latest Ref Range: 70 - 99 mg/dL 163 (H) Novolog 3units   Lab draw 191 at 0400 this am  Diabetes history: DM2 Outpatient Diabetes medications: Novolog 8-10 SSI; Lantus 35 units BID Current orders for Inpatient glycemic control: Lantus 15 units QD; Novolog 0-15 TID  Inpatient Diabetes Program Recommendations:     -Please consider increasing Lantus to 35 units QD (50% of home dose)   @1423 -Patient has returned from cath lab.  Spoke with patient at bedside.  He is currently drinking regular sprite and ordered sweet tea with lunch.  Denies difficulties obtaining insulins and supplies.  He states he takes his insulin as prescribed.  He checks his sugar around 2 times per day.  Encouraged pt to check before meals and HS for better insulin coverage.  He states he eats bad; he eats a lot of mashed potatoes and macaroni.  He does not drink drinks with sugar at home; states he'll have an occasional sweet tea if he goes out to eat.  Explained importance of increasing green vegetables and limiting carbohydrates.   He Saw Dr. Megan Salon on October 23rd; Dr. Megan Salon increased his Lantus at that time.  He cannot remember dose prior to that.  He states he sees Dr. Megan Salon every 3 months.  He says his last A1c was around 9.  Reviewed patient's current A1c of 12.7 Explained what a A1c is and what it measures. Also reviewed goal A1c with patient, importance of good glucose control @ home, and blood sugar goals.  Patient is going to see Dr. Megan Salon 1 week after  discharge.  He also has an appointment with a foot doctor next week.      Thanks, Geoffry Paradise, RN, BSN Diabetes Coordinator [phone] (8a-5p)

## 2019-10-31 NOTE — Plan of Care (Signed)

## 2019-10-31 NOTE — Progress Notes (Signed)
Echocardiogram 2D Echocardiogram has been performed.  Oneal Deputy Tomi Paddock 10/31/2019, 10:57 AM

## 2019-10-31 NOTE — Progress Notes (Signed)
Talked to Cardiology last night about placing orders for new transfer from [facility] , was told orders would be placed in the system this morning.

## 2019-10-31 NOTE — Interval H&P Note (Signed)
History and Physical Interval Note:  10/31/2019 1:18 PM  Samuel Bailey  has presented today for surgery, with the diagnosis of unstable angina.  The various methods of treatment have been discussed with the patient and family. After consideration of risks, benefits and other options for treatment, the patient has consented to  Procedure(s): LEFT HEART CATH AND CORONARY ANGIOGRAPHY (N/A) as a surgical intervention.  The patient's history has been reviewed, patient examined, no change in status, stable for surgery.  I have reviewed the patient's chart and labs.  Questions were answered to the patient's satisfaction.    Cath Lab Visit (complete for each Cath Lab visit)  Clinical Evaluation Leading to the Procedure:   ACS: Yes.    Non-ACS:    Anginal Classification: CCS III  Anti-ischemic medical therapy: No Therapy  Non-Invasive Test Results: Intermediate-risk stress test findings: cardiac mortality 1-3%/year  Prior CABG: No previous CABG       Jynesis Nakamura JordanMD,FACC] 10/31/2019 1:18 PM

## 2019-10-31 NOTE — Discharge Instructions (Signed)
Medications: - Continue all home medications.  - Continue 7 day course of Doxycycline as previously prescribed. Last day 11/03/2019. - Continue 10 day course of Augmentin as previously prescribed. Last day 11/06/2019. - Start Aspirin 81mg  (baby Aspirin) daily. You can get the over-the-counter.  **It is very important that you follow up with your primary care provider regarding the cellulitis of her right foot. Please call Dr. Megan Salon as soon as possible to schedule an appointment.**  Post Cardiac Catheterization: NO HEAVY LIFTING OR SEXUAL ACTIVITY X 7 DAYS. NO DRIVING X 2-3 DAYS. NO SOAKING BATHS, HOT TUBS, POOLS, ETC., X 7 DAYS.  Radial Site Care: Refer to this sheet in the next few weeks. These instructions provide you with information on caring for yourself after your procedure. Your caregiver may also give you more specific instructions. Your treatment has been planned according to current medical practices, but problems sometimes occur. Call your caregiver if you have any problems or questions after your procedure. HOME CARE INSTRUCTIONS  You may shower the day after the procedure.Remove the bandage (dressing) and gently wash the site with plain soap and water.Gently pat the site dry.   Do not apply powder or lotion to the site.   Do not submerge the affected site in water for 3 to 5 days.   Inspect the site at least twice daily.   Do not flex or bend the affected arm for 24 hours.   No lifting over 5 pounds (2.3 kg) for 5 days after your procedure.   Do not drive home if you are discharged the same day of the procedure. Have someone else drive you.   What to expect:  Any bruising will usually fade within 1 to 2 weeks.   Blood that collects in the tissue (hematoma) may be painful to the touch. It should usually decrease in size and tenderness within 1 to 2 weeks.  SEEK IMMEDIATE MEDICAL CARE IF:  You have unusual pain at the radial site.   You have redness, warmth,  swelling, or pain at the radial site.   You have drainage (other than a small amount of blood on the dressing).   You have chills.   You have a fever or persistent symptoms for more than 72 hours.   You have a fever and your symptoms suddenly get worse.   Your arm becomes pale, cool, tingly, or numb.   You have heavy bleeding from the site. Hold pressure on the site.

## 2019-10-31 NOTE — Plan of Care (Signed)
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
## 2019-11-07 ENCOUNTER — Ambulatory Visit: Payer: Medicaid Other | Admitting: Cardiology

## 2019-11-08 ENCOUNTER — Encounter: Payer: Self-pay | Admitting: Cardiology

## 2019-11-08 ENCOUNTER — Other Ambulatory Visit: Payer: Self-pay

## 2019-11-08 ENCOUNTER — Ambulatory Visit (INDEPENDENT_AMBULATORY_CARE_PROVIDER_SITE_OTHER): Payer: Medicaid Other | Admitting: Cardiology

## 2019-11-08 VITALS — BP 140/80 | HR 83 | Ht 68.0 in | Wt 235.0 lb

## 2019-11-08 DIAGNOSIS — E782 Mixed hyperlipidemia: Secondary | ICD-10-CM

## 2019-11-08 DIAGNOSIS — Z13220 Encounter for screening for lipoid disorders: Secondary | ICD-10-CM | POA: Diagnosis not present

## 2019-11-08 DIAGNOSIS — E1140 Type 2 diabetes mellitus with diabetic neuropathy, unspecified: Secondary | ICD-10-CM | POA: Diagnosis not present

## 2019-11-08 DIAGNOSIS — IMO0002 Reserved for concepts with insufficient information to code with codable children: Secondary | ICD-10-CM

## 2019-11-08 DIAGNOSIS — E669 Obesity, unspecified: Secondary | ICD-10-CM

## 2019-11-08 DIAGNOSIS — I2510 Atherosclerotic heart disease of native coronary artery without angina pectoris: Secondary | ICD-10-CM

## 2019-11-08 DIAGNOSIS — I10 Essential (primary) hypertension: Secondary | ICD-10-CM

## 2019-11-08 DIAGNOSIS — E1165 Type 2 diabetes mellitus with hyperglycemia: Secondary | ICD-10-CM
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
## 2019-11-08 NOTE — Patient Instructions (Signed)
Medication Instructions:  Your physician recommends that you continue on your current medications as directed. Please refer to the Current Medication list given to you today.  *If you need a refill on your cardiac medications before your next appointment, please call your pharmacy*  Lab Work: Your physician recommends that you return for lab work in:   Before next visit: LIPID fasting  If you have labs (blood work) drawn today and your tests are completely normal, you will receive your results only by: Marland Kitchen MyChart Message (if you have MyChart) OR . A paper copy in the mail If you have any lab test that is abnormal or we need to change your treatment, we will call you to review the results.  Testing/Procedures: None  Follow-Up: At [facility], you and your health needs are our priority.  As part of our continuing mission to provide you with exceptional heart care, we have created designated Provider Care Teams.  These Care Teams include your primary Cardiologist (physician) and Advanced Practice Providers (APPs -  Physician Assistants and Nurse Practitioners) who all work together to provide you with the care you need, when you need it.  Your next appointment:   6 month(s)  The format for your next appointment:   In Person  Provider:   Berniece Salines, DO  Other Instructions

## 2019-11-20 ENCOUNTER — Ambulatory Visit: Payer: Medicaid Other | Admitting: Podiatry

## 2019-11-21 ENCOUNTER — Ambulatory Visit: Payer: Medicaid Other | Admitting: Sports Medicine

## 2019-12-02 ENCOUNTER — Other Ambulatory Visit: Payer: Self-pay | Admitting: Podiatry

## 2019-12-02 ENCOUNTER — Ambulatory Visit: Payer: Self-pay

## 2019-12-02 ENCOUNTER — Other Ambulatory Visit: Payer: Self-pay

## 2019-12-02 ENCOUNTER — Ambulatory Visit (INDEPENDENT_AMBULATORY_CARE_PROVIDER_SITE_OTHER): Payer: Medicaid Other | Admitting: Podiatry

## 2019-12-02 ENCOUNTER — Ambulatory Visit (INDEPENDENT_AMBULATORY_CARE_PROVIDER_SITE_OTHER): Payer: Medicaid Other

## 2019-12-02 DIAGNOSIS — Z9889 Other specified postprocedural states: Secondary | ICD-10-CM

## 2019-12-02 DIAGNOSIS — R6510 Systemic inflammatory response syndrome (SIRS) of non-infectious origin without acute organ dysfunction: Secondary | ICD-10-CM

## 2019-12-02 DIAGNOSIS — R55 Syncope and collapse: Secondary | ICD-10-CM

## 2019-12-02 DIAGNOSIS — I200 Unstable angina: Secondary | ICD-10-CM

## 2019-12-02 DIAGNOSIS — M79671 Pain in right foot: Secondary | ICD-10-CM

## 2019-12-02 DIAGNOSIS — L03031 Cellulitis of right toe: Secondary | ICD-10-CM

## 2019-12-02 DIAGNOSIS — I872 Venous insufficiency (chronic) (peripheral): Secondary | ICD-10-CM

## 2019-12-02 DIAGNOSIS — E860 Dehydration: Secondary | ICD-10-CM

## 2019-12-02 DIAGNOSIS — E1165 Type 2 diabetes mellitus with hyperglycemia: Secondary | ICD-10-CM

## 2019-12-02 DIAGNOSIS — Z8673 Personal history of transient ischemic attack (TIA), and cerebral infarction without residual deficits: Secondary | ICD-10-CM

## 2019-12-02 DIAGNOSIS — E1140 Type 2 diabetes mellitus with diabetic neuropathy, unspecified: Secondary | ICD-10-CM | POA: Insufficient documentation

## 2019-12-02 DIAGNOSIS — F1290 Cannabis use, unspecified, uncomplicated: Secondary | ICD-10-CM

## 2019-12-02 DIAGNOSIS — E669 Obesity, unspecified: Secondary | ICD-10-CM | POA: Insufficient documentation

## 2019-12-02 DIAGNOSIS — Z8711 Personal history of peptic ulcer disease: Secondary | ICD-10-CM

## 2019-12-02 DIAGNOSIS — R531 Weakness: Secondary | ICD-10-CM

## 2019-12-02 DIAGNOSIS — S90424A Blister (nonthermal), right lesser toe(s), initial encounter: Secondary | ICD-10-CM

## 2019-12-02 HISTORY — DX: Unstable angina: I20.0

## 2019-12-02 HISTORY — DX: Obesity, unspecified: E66.9

## 2019-12-02 HISTORY — DX: Dehydration: E86.0

## 2019-12-02 HISTORY — DX: Blister (nonthermal), right lesser toe(s), initial encounter: S90.424A

## 2019-12-02 HISTORY — DX: Type 2 diabetes mellitus with diabetic neuropathy, unspecified: E11.40

## 2019-12-02 HISTORY — DX: Systemic inflammatory response syndrome (sirs) of non-infectious origin without acute organ dysfunction: R65.10

## 2019-12-02 HISTORY — DX: Weakness: R53.1

## 2019-12-02 HISTORY — DX: Syncope and collapse: R55

## 2019-12-02 HISTORY — DX: Cannabis use, unspecified, uncomplicated: F12.90

## 2019-12-02 HISTORY — DX: Personal history of transient ischemic attack (TIA), and cerebral infarction without residual deficits: Z86.73

## 2019-12-02 HISTORY — DX: Personal history of peptic ulcer disease: Z87.11

## 2019-12-02 HISTORY — DX: Type 2 diabetes mellitus with hyperglycemia: E11.65

## 2019-12-22 NOTE — Progress Notes (Signed)
Subjective:  Patient ID: Samuel Bailey, male    DOB: Dec 18, 1961,  MRN: [id number]  Chief Complaint  Patient presents with  . Foot Injury    Rt hallux joint pian/injury (stepped on a nail ) x 4 mo; 8/10 shapr constatn pains -pt states he was hospitalized fro 1 wk due to infection pt states infection is better but pain is not -wrose wwith walkign -w/ swellign -pt denies redness Tx: none -pt states foot felt much better when he was taking doxycycline     59 y.o. male presents with the above complaint.  History above confirmed with patient  Review of Systems: Negative except as noted in the HPI. Denies N/V/F/Ch.  Past Medical History:  Diagnosis Date  . Anxiety   . Candidal intertrigo   . Chronic headaches   . Chronic pain   . GERD (gastroesophageal reflux disease)   . High cholesterol   . HTN (hypertension)   . Hyperlipemia   . Irreducible hernia of anterior abdominal wall   . Lumbar radiculopathy   . Peripheral neuropathy   . Right hip pain   . Sciatica of right side   . Seizure disorder (HCC)   . Uncontrolled type 2 diabetes with neuropathy (HCC)   . Wrist pain     Current Outpatient Medications:  .  amoxicillin-clavulanate (AUGMENTIN) 875-125 MG tablet, Take 875 mg by mouth 2 (two) times daily., Disp: , Rfl:  .  aspirin EC 81 MG EC tablet, Take 1 tablet (81 mg total) by mouth daily., Disp: , Rfl:  .  atorvastatin (LIPITOR) 20 MG tablet, Take 20 mg by mouth daily., Disp: , Rfl:  .  ciprofloxacin (CIPRO) 500 MG tablet, Take 500 mg by mouth 2 (two) times daily., Disp: , Rfl:  .  clindamycin (CLEOCIN) 300 MG capsule, Take 300 mg by mouth 3 (three) times daily., Disp: , Rfl:  .  doxycycline (VIBRA-TABS) 100 MG tablet, Take 100 mg by mouth 2 (two) times daily., Disp: , Rfl:  .  empagliflozin (JARDIANCE) 10 MG TABS tablet, Take 10 mg by mouth daily., Disp: , Rfl:  .  FLUoxetine (PROZAC) 20 MG tablet, Take 20 mg by mouth daily as needed (anxiety). , Disp: , Rfl:  .  gabapentin  (NEURONTIN) 300 MG capsule, Take 300-600 mg by mouth See admin instructions. Take 300mg  in the morning and 600mg  at night., Disp: , Rfl:  .  insulin aspart (NOVOLOG) 100 UNIT/ML injection, Inject 3-10 Units into the skin 3 (three) times daily with meals. Per sliding scale, Disp: , Rfl:  .  insulin glargine (LANTUS) 100 unit/mL SOPN, Inject 35 Units into the skin 2 (two) times daily. , Disp: , Rfl:  .  nitroGLYCERIN (NITROSTAT) 0.4 MG SL tablet, Place 0.4 mg under the tongue every 5 (five) minutes as needed for chest pain., Disp: , Rfl:  .  omeprazole (PRILOSEC) 40 MG capsule, Take 40 mg by mouth daily. , Disp: , Rfl:  .  roflumilast (DALIRESP) 500 MCG TABS tablet, Take 500 mcg by mouth daily., Disp: , Rfl:  .  tiotropium (SPIRIVA) 18 MCG inhalation capsule, Place 18 mcg into inhaler and inhale daily., Disp: , Rfl:   Social History   Tobacco Use  Smoking Status Former Smoker  . Packs/day: 1.00  . Years: 35.00  . Pack years: 35.00  . Types: Cigarettes  . Quit date: 02/19/2015  . Years since quitting: 4.8  Smokeless Tobacco Current User    Allergies  Allergen Reactions  . Lovastatin  diarrhea   Objective:  There were no vitals filed for this visit. There is no height or weight on file to calculate BMI. Constitutional Well developed. Well nourished.  Vascular Dorsalis pedis pulses palpable bilaterally. Posterior tibial pulses palpable bilaterally. Capillary refill normal to all digits.  No cyanosis or clubbing noted. Pedal hair growth normal.  Neurologic Normal speech. Oriented to person, place, and time. Epicritic sensation to light touch grossly present bilaterally.  Dermatologic Nails well groomed and normal in appearance. No open wounds. No skin lesions. Pitting edema bilaterally  Orthopedic:  No pain to palpation   Radiographs: No acute fractures or residual foreign bodies Assessment:   1. Post-operative state    Plan:  Patient was evaluated and treated and all  questions answered.  History of cellulitis right  -No active cellulitis discussed with patient that it is unlikely there is residual infection -X-rays taken no residual foreign bodies  Bilateral lower extremity edema, venous insufficiency -Surgigrip applied bilateral  No follow-ups on file.

## 2020-01-13 ENCOUNTER — Ambulatory Visit (INDEPENDENT_AMBULATORY_CARE_PROVIDER_SITE_OTHER): Payer: Medicaid Other | Admitting: Podiatry

## 2020-01-13 ENCOUNTER — Other Ambulatory Visit: Payer: Self-pay

## 2020-01-13 DIAGNOSIS — L601 Onycholysis: Secondary | ICD-10-CM

## 2020-01-13 DIAGNOSIS — L608 Other nail disorders: Secondary | ICD-10-CM

## 2020-01-13 DIAGNOSIS — L03031 Cellulitis of right toe: Secondary | ICD-10-CM | POA: Diagnosis not present

## 2020-01-13 DIAGNOSIS — I872 Venous insufficiency (chronic) (peripheral): Secondary | ICD-10-CM

## 2020-01-13 NOTE — Progress Notes (Signed)
  Subjective:  Patient ID: Samuel Bailey, male    DOB: 08-24-1961,  MRN: [id number]  Chief Complaint  Patient presents with  . Cellulitis    F/U Rt cellulitis Pt. states," swelling up in the evening, it aches a lot all day long; 8/10." tx: compression sock  . Nail Problem    Rt hallux discolored _pt dneies pain/injury Tx: heat socks    59 y.o. male presents with the above complaint. History confirmed with patient.  Objective:  Physical Exam: warm, good capillary refill, no trophic changes or ulcerative lesions, normal DP and PT pulses and normal sensory exam. Right Foot: pitting edema, no frank cellulitis right foot. Right hallux loosening with subungual hemorrhage   Assessment:   1. Venous (peripheral) insufficiency   2. Onycholysis   3. Subungual hemorrhage     Plan:  Patient was evaluated and treated and all questions answered.  Onychodystrophy, Subungual Hemorrhage -Nail gently debrided  Venous Insufficiency R -Dispense tubigrip. Advised to use only during the day.  Return in about 6 weeks (around 02/24/2020) for edema f/u .

## 2020-02-24 ENCOUNTER — Ambulatory Visit (INDEPENDENT_AMBULATORY_CARE_PROVIDER_SITE_OTHER): Payer: Medicaid Other | Admitting: Podiatry

## 2020-02-24 ENCOUNTER — Other Ambulatory Visit: Payer: Self-pay

## 2020-02-24 DIAGNOSIS — B351 Tinea unguium: Secondary | ICD-10-CM | POA: Diagnosis not present

## 2020-02-24 DIAGNOSIS — E1142 Type 2 diabetes mellitus with diabetic polyneuropathy: Secondary | ICD-10-CM

## 2020-02-24 DIAGNOSIS — I872 Venous insufficiency (chronic) (peripheral): Secondary | ICD-10-CM | POA: Diagnosis not present

## 2020-02-24 DIAGNOSIS — M792 Neuralgia and neuritis, unspecified: Secondary | ICD-10-CM

## 2020-02-24 DIAGNOSIS — E1169 Type 2 diabetes mellitus with other specified complication: Secondary | ICD-10-CM

## 2020-02-24 NOTE — Progress Notes (Signed)
  Subjective:  Patient ID: Samuel Bailey, male    DOB: 05-06-1961,  MRN: [id number]  Chief Complaint  Patient presents with  . Foot Swelling    F/U Rt edema Pt. states," swelling maybe a little bit better, but the numbness is worse on both feet, it keep sme up at night." -with numbness and tinlging from midfoot up the toes -w/ stiffness and tightness Tx" compression socks   . Diabetes    FBS: 200 A1C: 23    59 y.o. male presents with the above complaint. History confirmed with patient.   Objective:  Physical Exam: warm, good capillary refill, nail exam onychomycosis of the toenails, dystrophic nails and subungual hematoma, no trophic changes or ulcerative lesions, normal DP and PT pulses and decreased sensory exam. Pitting edema bilateral lower extremities. No images are attached to the encounter.  Assessment:   1. Onychomycosis of multiple toenails with type 2 diabetes mellitus and peripheral neuropathy (HCC)   2. Venous (peripheral) insufficiency   3. Neuralgia and neuritis      Plan:  Patient was evaluated and treated and all questions answered.  DM with DPN -Patient is diabetic with a qualifying condition for at risk foot care.  Procedure: Nail Debridement Rationale: Patient meets criteria for routine foot care due to  Type of Debridement: manual, sharp debridement. Instrumentation: Nail nipper, rotary burr. Number of Nails: 10  Venous Insufficiency with pitting edema -Continue compression socks -May benefit from discussion of diuretic with PCP for edema  Neuropathy -Discussed controlling sugars rather than making pharmacologic changes at this time.   No follow-ups on file.   MDM

## 2020-05-26 ENCOUNTER — Ambulatory Visit (INDEPENDENT_AMBULATORY_CARE_PROVIDER_SITE_OTHER): Payer: Medicaid Other | Admitting: Podiatry

## 2020-05-26 DIAGNOSIS — Z5329 Procedure and treatment not carried out because of patient's decision for other reasons: Secondary | ICD-10-CM

## 2020-05-26 NOTE — Progress Notes (Signed)
No show for appt. 

## 2020-06-09 ENCOUNTER — Other Ambulatory Visit: Payer: Self-pay

## 2020-06-09 ENCOUNTER — Ambulatory Visit (INDEPENDENT_AMBULATORY_CARE_PROVIDER_SITE_OTHER): Payer: Medicaid Other | Admitting: Podiatry

## 2020-06-09 DIAGNOSIS — B351 Tinea unguium: Secondary | ICD-10-CM | POA: Diagnosis not present

## 2020-06-09 DIAGNOSIS — E1142 Type 2 diabetes mellitus with diabetic polyneuropathy: Secondary | ICD-10-CM

## 2020-06-09 DIAGNOSIS — I872 Venous insufficiency (chronic) (peripheral): Secondary | ICD-10-CM

## 2020-06-09 DIAGNOSIS — E1169 Type 2 diabetes mellitus with other specified complication: Secondary | ICD-10-CM | POA: Diagnosis not present

## 2020-06-21 NOTE — Progress Notes (Addendum)
  Subjective:  Patient ID: Samuel Bailey, male    DOB: 08-26-1961,  MRN: [id number]  Chief Complaint  Patient presents with  . Edema    F/U Rt edema Pt states," stopped wearing the compression socks, made my feet hurt worse. -wrose with swelling Tx: cane, warm water soaks    59 y.o. male presents with the above complaint. History confirmed with patient.  Also here for nail care  Objective:  Physical Exam: warm, good capillary refill, nail exam onychomycosis of the toenails, dystrophic nails and subungual hematoma, no trophic changes or ulcerative lesions, normal DP and PT pulses and decreased sensory exam. Pitting edema bilateral lower extremities. No images are attached to the encounter.  Assessment:   1. Onychomycosis of multiple toenails with type 2 diabetes mellitus and peripheral neuropathy (HCC)      Plan:  Patient was evaluated and treated and all questions answered.  DM with DPN -Patient is diabetic with a qualifying condition for at risk foot care.   Procedure: Nail Debridement Rationale: Patient meets criteria for routine foot care due to DPN Type of Debridement: manual, sharp debridement. Instrumentation: Nail nipper, rotary burr. Number of Nails: 10  Localized edema -Multilayer compression dressing applied correction of edema   No follow-ups on file.

## 2020-06-23 ENCOUNTER — Ambulatory Visit (INDEPENDENT_AMBULATORY_CARE_PROVIDER_SITE_OTHER): Payer: Medicaid Other | Admitting: Podiatry

## 2020-06-23 ENCOUNTER — Ambulatory Visit (INDEPENDENT_AMBULATORY_CARE_PROVIDER_SITE_OTHER): Payer: Medicaid Other

## 2020-06-23 ENCOUNTER — Other Ambulatory Visit: Payer: Self-pay

## 2020-06-23 DIAGNOSIS — S93324A Dislocation of tarsometatarsal joint of right foot, initial encounter: Secondary | ICD-10-CM

## 2020-06-23 DIAGNOSIS — M25571 Pain in right ankle and joints of right foot: Secondary | ICD-10-CM

## 2020-06-23 DIAGNOSIS — M14671 Charcot's joint, right ankle and foot: Secondary | ICD-10-CM

## 2020-06-23 DIAGNOSIS — I872 Venous insufficiency (chronic) (peripheral): Secondary | ICD-10-CM

## 2020-06-23 NOTE — Progress Notes (Signed)
  Subjective:  Patient ID: Samuel Bailey, male    DOB: 12-18-1961,  MRN: [id number]  No chief complaint on file.   59 y.o. male presents for concern of worsening pain and swelling to the right lower extremity.  States that it began after the compression bandage was put on his last visit.  States the foot is very painful despite having neuropathy in his foot.  Objective:  Physical Exam: warm, good capillary refill, no trophic changes or ulcerative lesions, normal DP and PT pulses and normal sensory exam.  Right Foot: Warmth erythema edema to the right foot with obvious deformity at the medial arch with pain to palpation crepitus at the mid tarsal joints  No images are attached to the encounter.  Radiographs: X-ray of the right foot: Acute Charcot Lisfranc fracture dislocation Assessment:   1. Charcot's joint of right foot   2. Venous (peripheral) insufficiency   3. Pain in joint involving ankle and foot, right   4. Lisfranc dislocation, right, initial encounter      Plan:  Patient was evaluated and treated and all questions answered.  Charcot foot right foot -X-rays taken and reviewed -Educated etiology -Dispensed cam boot medically necessary for protection and to prevent worsening of the Charcot deformity -Discussed the importance of limited weightbearing on extremity in order to prevent worsening.  Discussed that he is at risk of below-knee amputation should the area ulcerated and deformity progress rapidly.  Localized edema -Multilayer compression dressing applied   No follow-ups on file.

## 2020-06-29 ENCOUNTER — Other Ambulatory Visit: Payer: Self-pay

## 2020-06-29 ENCOUNTER — Ambulatory Visit (INDEPENDENT_AMBULATORY_CARE_PROVIDER_SITE_OTHER): Payer: Medicaid Other | Admitting: Podiatry

## 2020-06-29 DIAGNOSIS — S93324D Dislocation of tarsometatarsal joint of right foot, subsequent encounter: Secondary | ICD-10-CM

## 2020-06-29 DIAGNOSIS — M14671 Charcot's joint, right ankle and foot: Secondary | ICD-10-CM | POA: Diagnosis not present

## 2020-06-29 DIAGNOSIS — R600 Localized edema: Secondary | ICD-10-CM

## 2020-06-29 NOTE — Progress Notes (Signed)
  Subjective:  Patient ID: Samuel Bailey, male    DOB: 1961-04-25,  MRN: [id number]  Chief Complaint  Patient presents with  . charcot    F/U Rt charcot Pt. states," it sitll hurts constantly; 8/10 sharp apins -no improvement -=w/ swelling and numbness in toes Tx: boot, crtuches and eelvation -unna boot dry clean and intact  . Diabetes    FBS: na A1C; 13.9    59 y.o. male presents for f/u of Charcot foot. Having severe pain.  Objective:  Physical Exam: warm, good capillary refill, no trophic changes or ulcerative lesions, normal DP and PT pulses and normal sensory exam.  Right Foot: Decreased warmth, erythema and edema to the right foot with obvious deformity at the medial arch; worsening deformity medially with pain to palpation crepitus at the mid tarsal joints.  Assessment:   1. Charcot's joint of right foot   2. Localized edema   3. Lisfranc dislocation, right, subsequent encounter      Plan:  Patient was evaluated and treated and all questions answered.  Charcot foot right foot -Patient states he has been trying to keep weight off of it but has been weightbearing at times. -I again discussed he is high risk of BKA with continued WB. The alignment seems worse today. His A1c is >13. He is a poor candidate for surgery or reconstruction and is at a high risk of complication. He is at a high risk of developing ulceration as well.  Localized edema -Multilayer compression dressing applied  Return in about 1 week (around 07/06/2020) for Charcot foot with XRs.

## 2020-06-30 ENCOUNTER — Ambulatory Visit (INDEPENDENT_AMBULATORY_CARE_PROVIDER_SITE_OTHER): Payer: Medicaid Other | Admitting: Cardiology

## 2020-06-30 ENCOUNTER — Encounter: Payer: Self-pay | Admitting: Cardiology

## 2020-06-30 VITALS — BP 128/72 | HR 92 | Ht 68.0 in | Wt 244.4 lb

## 2020-06-30 DIAGNOSIS — E1140 Type 2 diabetes mellitus with diabetic neuropathy, unspecified: Secondary | ICD-10-CM

## 2020-06-30 DIAGNOSIS — I10 Essential (primary) hypertension: Secondary | ICD-10-CM

## 2020-06-30 DIAGNOSIS — I2510 Atherosclerotic heart disease of native coronary artery without angina pectoris: Secondary | ICD-10-CM | POA: Diagnosis not present

## 2020-06-30 DIAGNOSIS — I208 Other forms of angina pectoris: Secondary | ICD-10-CM

## 2020-06-30 DIAGNOSIS — E669 Obesity, unspecified: Secondary | ICD-10-CM

## 2020-06-30 DIAGNOSIS — IMO0002 Reserved for concepts with insufficient information to code with codable children: Secondary | ICD-10-CM

## 2020-06-30 DIAGNOSIS — E1165 Type 2 diabetes mellitus with hyperglycemia: Secondary | ICD-10-CM

## 2020-06-30 DIAGNOSIS — E785 Hyperlipidemia, unspecified: Secondary | ICD-10-CM

## 2020-06-30 MED ORDER — ISOSORBIDE MONONITRATE ER 30 MG PO TB24
15.0000 mg | ORAL_TABLET | Freq: Every day | ORAL | 3 refills | Status: DC
Start: 2020-06-30 — End: 2020-11-09

## 2020-06-30 MED ORDER — ATORVASTATIN CALCIUM 40 MG PO TABS
40.0000 mg | ORAL_TABLET | Freq: Every day | ORAL | 3 refills | Status: DC
Start: 2020-06-30 — End: 2021-03-29

## 2020-06-30 MED ORDER — EZETIMIBE 10 MG PO TABS
10.0000 mg | ORAL_TABLET | Freq: Every day | ORAL | 3 refills | Status: DC
Start: 2020-06-30 — End: 2021-03-29
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
## 2020-06-30 NOTE — Patient Instructions (Signed)
Medication Instructions:   1) Start Isosorbide (Imdur) 15 mg daily   2) Start Ezetimibe (Zetia) 10 mg daily   3) Increase Atorvastatin (Lipitor) to 40 mg daily *If you need a refill on your cardiac medications before your next appointment, please call your pharmacy*   Lab Work: None ordered   If you have labs (blood work) drawn today and your tests are completely normal, you will receive your results only by: Marland Kitchen MyChart Message (if you have MyChart) OR . A paper copy in the mail If you have any lab test that is abnormal or we need to change your treatment, we will call you to review the results.   Testing/Procedures: None ordered    Follow-Up: At [facility], you and your health needs are our priority.  As part of our continuing mission to provide you with exceptional heart care, we have created designated Provider Care Teams.  These Care Teams include your primary Cardiologist (physician) and Advanced Practice Providers (APPs -  Physician Assistants and Nurse Practitioners) who all work together to provide you with the care you need, when you need it.  We recommend signing up for the patient portal called "MyChart".  Sign up information is provided on this After Visit Summary.  MyChart is used to connect with patients for Virtual Visits (Telemedicine).  Patients are able to view lab/test results, encounter notes, upcoming appointments, etc.  Non-urgent messages can be sent to your provider as well.   To learn more about what you can do with MyChart, go to [email or web address].    Your next appointment:   3 month(s)  The format for your next appointment:   In Person  Provider:   Thomasene Ripple, DO   Other Instructions None

## 2020-07-06 ENCOUNTER — Ambulatory Visit (INDEPENDENT_AMBULATORY_CARE_PROVIDER_SITE_OTHER): Payer: Medicaid Other | Admitting: Podiatry

## 2020-07-06 ENCOUNTER — Other Ambulatory Visit: Payer: Self-pay | Admitting: Podiatry

## 2020-07-06 ENCOUNTER — Ambulatory Visit (INDEPENDENT_AMBULATORY_CARE_PROVIDER_SITE_OTHER): Payer: Medicaid Other

## 2020-07-06 ENCOUNTER — Other Ambulatory Visit: Payer: Self-pay

## 2020-07-06 DIAGNOSIS — M14671 Charcot's joint, right ankle and foot: Secondary | ICD-10-CM | POA: Diagnosis not present

## 2020-07-06 DIAGNOSIS — R600 Localized edema: Secondary | ICD-10-CM

## 2020-07-06 DIAGNOSIS — M216X1 Other acquired deformities of right foot: Secondary | ICD-10-CM | POA: Diagnosis not present

## 2020-07-06 NOTE — Progress Notes (Signed)
  Subjective:  Patient ID: Samuel Bailey, male    DOB: Feb 14, 1961,  MRN: [id number]  Chief Complaint  Patient presents with  . charcot    F/U Rt charcot Pt. states he had boot at all times, even slept with it. Ptst ates," it's been hurting, no better. pain at the bottom wherer theDR. worked on it and at the [employer]; 8/10." - w/ swellign tx: boot and cruthces   . Diabetes    BF:S 300 a1C; 13.9    59 y.o. male presents for f/u of Charcot foot. Still having severe pain  Objective:  Physical Exam: warm, good capillary refill, no trophic changes or ulcerative lesions, normal DP and PT pulses and normal sensory exam.  Right Foot: Decreased warmth, erythema and edema to the right foot with obvious deformity at the medial arch; worsening deformity medially with pain to palpation but no crepitus at the mid tarsal joints.  Assessment:   1. Localized edema   2. Charcot's joint of right foot      Plan:  Patient was evaluated and treated and all questions answered.  Charcot foot right foot -Patient states he has been trying to keep weight off of it but has been weightbearing at times. -XR taken, decreasing edema, increased consolidation laterally without worsening displacement, worsening dislocation of medial cuneiform   Localized edema -Repeat multilayer compression dressing applied  No follow-ups on file.

## 2020-07-13 ENCOUNTER — Other Ambulatory Visit: Payer: Self-pay | Admitting: Podiatry

## 2020-07-13 ENCOUNTER — Other Ambulatory Visit: Payer: Self-pay

## 2020-07-13 ENCOUNTER — Ambulatory Visit (INDEPENDENT_AMBULATORY_CARE_PROVIDER_SITE_OTHER): Payer: Medicaid Other

## 2020-07-13 ENCOUNTER — Ambulatory Visit (INDEPENDENT_AMBULATORY_CARE_PROVIDER_SITE_OTHER): Payer: Medicaid Other | Admitting: Podiatry

## 2020-07-13 DIAGNOSIS — R600 Localized edema: Secondary | ICD-10-CM

## 2020-07-13 DIAGNOSIS — M79671 Pain in right foot: Secondary | ICD-10-CM

## 2020-07-13 DIAGNOSIS — M14671 Charcot's joint, right ankle and foot: Secondary | ICD-10-CM

## 2020-07-13 DIAGNOSIS — M216X1 Other acquired deformities of right foot: Secondary | ICD-10-CM | POA: Diagnosis not present

## 2020-07-13 NOTE — Progress Notes (Signed)
  Subjective:  Patient ID: Samuel Bailey, male    DOB: Apr 01, 1961,  MRN: [id number]  Chief Complaint  Patient presents with  . charcot    F/U Rt charcot Pt. states," foot hurts all the time; 8/10." -no improvement -w/ numbness and tingling at toes Tx: boot and crutches -unna boot intact    59 y.o. male presents for f/u of Charcot foot. Still having severe pain  Objective:  Physical Exam: warm, good capillary refill, no trophic changes or ulcerative lesions, normal DP and PT pulses and normal sensory exam.  Right Foot: Still evident warmth, erythema and edema to the right foot with deformity at the medial arch; deformity medially with obvious bone dislocation, pain to palpation but no crepitus at the mid tarsal joints.  Assessment:   1. Localized edema   2. Charcot's joint of right foot      Plan:  Patient was evaluated and treated and all questions answered.  Charcot foot right foot -Patient states he has been trying to do better with NWB and using crutches -XR taken interval plantar displacement of medial cuneiform -Again discussed with patient I am concerned that he will ulcerate at this area and it will become infected and he stands at risk of losing his foot. He at present is a poor candidate for surgery given A1c of 12.7  Localized edema -Repeat unna multilayer compression dressing applied.  Return in about 1 week (around 07/20/2020).

## 2020-07-17 ENCOUNTER — Other Ambulatory Visit: Payer: Self-pay | Admitting: Podiatry

## 2020-07-17 DIAGNOSIS — M14671 Charcot's joint, right ankle and foot: Secondary | ICD-10-CM

## 2020-07-20 ENCOUNTER — Other Ambulatory Visit: Payer: Self-pay

## 2020-07-20 ENCOUNTER — Ambulatory Visit (INDEPENDENT_AMBULATORY_CARE_PROVIDER_SITE_OTHER): Payer: Medicaid Other | Admitting: Podiatry

## 2020-07-20 DIAGNOSIS — S93324D Dislocation of tarsometatarsal joint of right foot, subsequent encounter: Secondary | ICD-10-CM | POA: Diagnosis not present

## 2020-07-20 DIAGNOSIS — R600 Localized edema: Secondary | ICD-10-CM | POA: Diagnosis not present

## 2020-07-20 DIAGNOSIS — M14671 Charcot's joint, right ankle and foot: Secondary | ICD-10-CM

## 2020-07-20 NOTE — Progress Notes (Signed)
  Subjective:  Patient ID: Samuel Bailey, male    DOB: [birth year],  MRN: [id number]  Chief Complaint  Patient presents with  . charcot    F/U Rt charcot and swellign Pt. states," it's been killing me at the outside of my ankle, my whole foot has been killing me lately; 8/10 sharp constatn pains.' - unna boot dressing intact Tx: boot, crutches and elevation     59 y.o. male presents with the above complaint. History confirmed with patient. He has been wearing an Radio broadcast assistant since his last visit.  He is using crutches and stay off it is much as possible.  He said it has been painful and still swollen.  Sharp consistent pain.   Objective:  Physical Exam: warm, good capillary refill, no trophic changes or ulcerative lesions, normal DP and PT pulses and dense neuropathy throughout the lower extremity  Right Foot: Significant equinus present with collapse of medial arch and prominent medial cuneiform with extrusion.  Loss of arch height, not a complete rocker-bottom foot yet.  No ulceration.  Edema is improving.  Assessment:   1. Charcot's joint of right foot   2. Lisfranc dislocation, right, subsequent encounter   3. Localized edema      Plan:  Patient was evaluated and treated and all questions answered.  -Discussed the etiology and treatment options and condition for Charcot arthropathy with the patient.  I discussed with him that he has an acute flare still although this is resolving and our goal is to continue to manage his edema as this resolves.  His foot is relatively plantigrade, however the prominent medial cuneiform will likely be a source of ulceration.  I reviewed his most recent x-rays with Dr. Samuella Cota took at his last visit, he has mild abduction of the forefoot, significant equinus with severe declination of the hindfoot with collapse at the naviculocuneiform joint medially and extrusion of the medial cuneiform. -I discussed with him that surgery would likely be a good option for  him to prevent recurrence of the deformity as well as prevent future ulceration.  We did not specifically discuss a particular surgical procedure today.  However I think that he would likely benefit from reduction of the deformity with medial column arthrodesis, subtalar joint arthrodesis and calcaneocuboid joint arthrodesis.  We potentially could do this in single-stage with removal of the fracture medial cuneiform piece without having to first put on external fixator.  He does not have any open wounds or lesions currently.  No history of osteomyelitis here either.  -He will return in 1 week for further evaluation by Dr. Samuella Cota and we will discuss his surgical management further.   Return in about 1 week (around 07/27/2020) for with Dr Samuella Cota to recheck Charcot.

## 2020-07-27 ENCOUNTER — Ambulatory Visit (INDEPENDENT_AMBULATORY_CARE_PROVIDER_SITE_OTHER): Payer: Medicaid Other | Admitting: Podiatry

## 2020-07-27 ENCOUNTER — Other Ambulatory Visit: Payer: Self-pay

## 2020-07-27 DIAGNOSIS — S93324D Dislocation of tarsometatarsal joint of right foot, subsequent encounter: Secondary | ICD-10-CM

## 2020-07-27 DIAGNOSIS — R600 Localized edema: Secondary | ICD-10-CM

## 2020-07-27 DIAGNOSIS — M14671 Charcot's joint, right ankle and foot: Secondary | ICD-10-CM

## 2020-07-27 NOTE — Progress Notes (Signed)
°  Subjective:  Patient ID: Samuel Bailey, male    DOB: August 30, 1961,  MRN: [id number]  Chief Complaint  Patient presents with   charcot    F/U Rt charcot Pt. states," feeling pretty good, pain herer and there; 5/*10." -pain when up a lot -w/ numbness and tinlgin at toes tx: boot, crutches and resting   Diabetes    FBS: 220 a1C: 69    59 y.o. male presents for f/u of Charcot foot. Pain improving per patient.  Objective:  Physical Exam: warm, good capillary refill, no trophic changes or ulcerative lesions, normal DP and PT pulses and normal sensory exam.  Right Foot: Reducing warmth, erythema and edema to the right foot with deformity at the medial arch; deformity medially with obvious bone dislocation, pain to palpation but no crepitus at the mid tarsal joints.  Assessment:   1. Charcot's joint of right foot   2. Lisfranc dislocation, right, subsequent encounter   3. Localized edema      Plan:  Patient was evaluated and treated and all questions answered.  Charcot foot right foot -Continue NWB -XR next visit -Still would be hesistant to consider any intervention with his A1c so high. Patient states he will be going to an endocrinologist and getting an insulin pump.  Localized edema -Edema reduced -Repeat application of multilayer compression  Return in about 1 week (around 08/03/2020) for Charcot f/u with XRs.

## 2020-08-03 ENCOUNTER — Ambulatory Visit (INDEPENDENT_AMBULATORY_CARE_PROVIDER_SITE_OTHER): Payer: Medicaid Other | Admitting: Podiatry

## 2020-08-03 ENCOUNTER — Ambulatory Visit (INDEPENDENT_AMBULATORY_CARE_PROVIDER_SITE_OTHER): Payer: Medicaid Other

## 2020-08-03 ENCOUNTER — Other Ambulatory Visit: Payer: Self-pay

## 2020-08-03 ENCOUNTER — Other Ambulatory Visit: Payer: Self-pay | Admitting: Podiatry

## 2020-08-03 DIAGNOSIS — R600 Localized edema: Secondary | ICD-10-CM

## 2020-08-03 DIAGNOSIS — M216X1 Other acquired deformities of right foot: Secondary | ICD-10-CM

## 2020-08-03 DIAGNOSIS — M14671 Charcot's joint, right ankle and foot: Secondary | ICD-10-CM

## 2020-08-03 DIAGNOSIS — S93324D Dislocation of tarsometatarsal joint of right foot, subsequent encounter: Secondary | ICD-10-CM | POA: Diagnosis not present

## 2020-08-03 NOTE — Progress Notes (Signed)
  Subjective:  Patient ID: Samuel Bailey, male    DOB: 1961-11-23,  MRN: [id number]  Chief Complaint  Patient presents with  . charcot    F/U Rt charcot Pt. states," feeling a little bit better with pain; 5/10." - w/ numbness and tinlging at toes tx: boot, crutches and advil   . Diabetes    FBS: 210 a1C: 10    59 y.o. male presents for f/u of Charcot foot. Hx as above confirmed with patient.  Objective:  Physical Exam: warm, good capillary refill, no trophic changes or ulcerative lesions, normal DP and PT pulses and normal sensory exam.  Right Foot: Reducing warmth, erythema and edema to the right foot with deformity at the medial arch; deformity medially with obvious bone dislocation, pain to palpation but no crepitus at the mid tarsal joints.  Assessment:   1. Charcot's joint of right foot   2. Lisfranc dislocation, right, subsequent encounter   3. Localized edema      Plan:  Patient was evaluated and treated and all questions answered.  Charcot foot right foot -XR taken with increased consolidation -Should he be able to improve his sugars we can proced with exostectomy.  Localized edema -Repeat Unna boot application   Return in about 2 weeks (around 08/17/2020) for Charcot f/u .

## 2020-08-20 ENCOUNTER — Ambulatory Visit: Payer: Medicaid Other | Admitting: Podiatry

## 2020-08-27 ENCOUNTER — Ambulatory Visit: Payer: Medicaid Other | Admitting: Podiatry

## 2020-09-01 ENCOUNTER — Ambulatory Visit: Payer: Medicaid Other | Admitting: Sports Medicine

## 2020-09-04 ENCOUNTER — Encounter: Payer: Self-pay | Admitting: Sports Medicine

## 2020-09-04 ENCOUNTER — Ambulatory Visit: Payer: Medicaid Other | Admitting: Sports Medicine

## 2020-09-04 ENCOUNTER — Other Ambulatory Visit: Payer: Self-pay

## 2020-09-04 DIAGNOSIS — E1142 Type 2 diabetes mellitus with diabetic polyneuropathy: Secondary | ICD-10-CM

## 2020-09-04 DIAGNOSIS — I872 Venous insufficiency (chronic) (peripheral): Secondary | ICD-10-CM | POA: Diagnosis not present

## 2020-09-04 DIAGNOSIS — S93324D Dislocation of tarsometatarsal joint of right foot, subsequent encounter: Secondary | ICD-10-CM

## 2020-09-04 DIAGNOSIS — R600 Localized edema: Secondary | ICD-10-CM | POA: Diagnosis not present

## 2020-09-04 DIAGNOSIS — E1165 Type 2 diabetes mellitus with hyperglycemia: Secondary | ICD-10-CM

## 2020-09-04 DIAGNOSIS — M14671 Charcot's joint, right ankle and foot: Secondary | ICD-10-CM

## 2020-09-04 NOTE — Progress Notes (Signed)
Subjective: LADARIEN BEEKS is a 59 y.o. male patient who presents to office for evaluation of right foot pain. Patient reports that he has sharp pains 8 out of 10 over the right foot with swelling and redness reports that the Unna boot tends to help keep the pressure off that he is doing the best he can to manage with his pain and has been staying compliant with wearing his cam boot by Dr. Samuella Cota advised him.  Patient reports that his blood sugar this morning was 200 and does not recall last A1c.  No new pedal complaints noted.  Patient Active Problem List   Diagnosis Date Noted  . Blister of toe of right foot 12/02/2019  . SIRS (systemic inflammatory response syndrome) (HCC) 12/02/2019  . Diabetic neuropathy (HCC) 12/02/2019  . Generalized weakness 12/02/2019  . History of CVA (cerebrovascular accident) 12/02/2019  . History of peptic ulcer 12/02/2019  . 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 Marijuana smoker 12/02/2019  . Obesity (BMI 30-39.9) 12/02/2019  . Poorly controlled diabetes mellitus (HCC) 12/02/2019  . Syncope and collapse 12/02/2019  . Unstable angina (HCC) 12/02/2019  . Chest pain 10/31/2019  . Cellulitis of right foot 10/31/2019  . 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  . Mild CAD 02/25/2015  . Tobacco use disorder 02/24/2015  . Uncontrolled type 2 diabetes with neuropathy (HCC)   . HTN (hypertension)   . Lumbar radiculopathy   . Chronic pain   . Peripheral neuropathy   . Seizure disorder (HCC)   . GERD (gastroesophageal reflux disease)   . Hyperlipidemia   . Chronic headaches     Current Outpatient Medications on File Prior to Visit  Medication Sig Dispense Refill  . aspirin EC 81 MG EC tablet Take 1 tablet (81 mg total) by mouth daily.    Marland Kitchen atorvastatin (LIPITOR) 40 MG tablet Take 1 tablet (40 mg total) by mouth daily. 90 tablet 3  . ezetimibe (ZETIA) 10 MG tablet Take 1 tablet (10 mg total) by mouth daily. 90 tablet 3  . FLUoxetine (PROZAC) 20 MG tablet Take  20 mg by mouth daily as needed (anxiety).     . gabapentin (NEURONTIN) 300 MG capsule Take 300-600 mg by mouth See admin instructions. Take 300mg  in the morning and 600mg  at night.    . insulin aspart (NOVOLOG) 100 UNIT/ML injection Inject 3-10 Units into the skin 3 (three) times daily with meals. Per sliding scale    . insulin glargine (LANTUS) 100 unit/mL SOPN Inject 35 Units into the skin 2 (two) times daily.     . isosorbide mononitrate (IMDUR) 30 MG 24 hr tablet Take 0.5 tablets (15 mg total) by mouth daily. 45 tablet 3  . nitroGLYCERIN (NITROSTAT) 0.4 MG SL tablet Place 15 mg under the tongue every 5 (five) minutes as needed for chest pain.     omeprazole (PRILOSEC) 40 MG capsule Take 40 mg by mouth daily.     . roflumilast (DALIRESP) 500 MCG TABS tablet Take 500 mcg by mouth daily.    tiotropium (SPIRIVA) 18 MCG inhalation capsule Place 18 mcg into inhaler and inhale daily.     No current facility-administered medications on file prior to visit.    Allergies  Allergen Reactions  . Lovastatin     diarrhea    Objective:  General: Alert and oriented x3 in no acute distress  Dermatology: Focal erythema and edema to the right medial midfoot at area of deformity with obvious hypertrophic bone with dislocation at  the midtarsal joint.  No open lesions bilateral lower extremities, no webspace macerations, no ecchymosis bilateral, all nails x 10 are short and thickened  Vascular: Dorsalis Pedis and Posterior Tibial pedal pulses palpable, with blanchable erythema and edema to the right midfoot as above.  No significant warmth to palpation to the right midfoot.  Neurology: Protective sensation absent.  Musculoskeletal: Minimal tenderness to palpation to right foot with restricted range of motion of the midtarsal joint due to midfoot bony deformity.  There is significant deformity suggestive of Charcot foot.  Gait: Antalgic gait Cam boot assisted  Assessment and Plan: Problem List  Items Addressed This Visit      Endocrine   Diabetic neuropathy (HCC)   Poorly controlled diabetes mellitus (HCC)    Other Visit Diagnoses    Charcot's joint of right foot    -  Primary   Lisfranc dislocation, right, subsequent encounter       Localized edema       Venous (peripheral) insufficiency          -Complete examination performed -Previous x-rays reviewed -Discussed treatement options for Charcot deformity and advised patient to follow-up with Dr. Samuella Cota for further discussion of surgical plan for his right foot however at this time highly encourage patient to continue with good glycemic control and to do his best to get his sugars down in order for him to have any type of surgery procedure -Applied Unna boot and advised patient to keep intact until next visit to offer additional support and offloading of Charcot joint and to help with pain and swelling -Continue with rest elevation and Advil as needed for pain -Continue with cam boot at no additional charge gave patient a replacement/lightly use cam boot -Patient to return to office as scheduled to Dr. Samuella Cota or sooner if condition worsens.  Asencion Islam, DPM

## 2020-09-14 ENCOUNTER — Encounter: Payer: Self-pay | Admitting: Podiatry

## 2020-09-14 ENCOUNTER — Ambulatory Visit: Payer: Medicaid Other | Admitting: Podiatry

## 2020-09-14 ENCOUNTER — Other Ambulatory Visit: Payer: Self-pay

## 2020-09-14 DIAGNOSIS — S93324D Dislocation of tarsometatarsal joint of right foot, subsequent encounter: Secondary | ICD-10-CM

## 2020-09-14 DIAGNOSIS — M14671 Charcot's joint, right ankle and foot: Secondary | ICD-10-CM | POA: Diagnosis not present

## 2020-09-14 DIAGNOSIS — R600 Localized edema: Secondary | ICD-10-CM

## 2020-09-14 NOTE — Progress Notes (Signed)
  Subjective:  Patient ID: Samuel Bailey, male    DOB: [birth year],  MRN: [id number]  Chief Complaint  Patient presents with  . Foot Problem    i am doing better and the boot really helps alot and i am trying to get the sugar down    59 y.o. male presents for f/u of Charcot foot. Hx as above confirmed with patient.  Objective:  Physical Exam: warm, good capillary refill, no trophic changes or ulcerative lesions, normal DP and PT pulses and normal sensory exam.  Right Foot: No active warmth, erythema or edema to the right foot. Still with deformity at the medial arch; deformity medially with obvious bone dislocation, pain to palpation but no crepitus at the mid tarsal joints.  Assessment:   1. Charcot's joint of right foot   2. Lisfranc dislocation, right, subsequent encounter   3. Localized edema      Plan:  Patient was evaluated and treated and all questions answered.  Charcot foot right foot -Appears stable and coalesced. -Would benefit from surgical intervention at a later date if sugars controlled.  Localized edema -Switch to tubigrip   No follow-ups on file.

## 2020-09-29 DIAGNOSIS — B372 Candidiasis of skin and nail: Secondary | ICD-10-CM | POA: Insufficient documentation

## 2020-09-29 DIAGNOSIS — M5431 Sciatica, right side: Secondary | ICD-10-CM | POA: Insufficient documentation

## 2020-09-29 DIAGNOSIS — E7800 Pure hypercholesterolemia, unspecified: Secondary | ICD-10-CM | POA: Insufficient documentation

## 2020-09-29 DIAGNOSIS — M25539 Pain in unspecified wrist: Secondary | ICD-10-CM | POA: Insufficient documentation

## 2020-09-29 DIAGNOSIS — F419 Anxiety disorder, unspecified: Secondary | ICD-10-CM | POA: Insufficient documentation

## 2020-09-29 DIAGNOSIS — M25551 Pain in right hip: Secondary | ICD-10-CM | POA: Insufficient documentation

## 2020-09-29 DIAGNOSIS — K436 Other and unspecified ventral hernia with obstruction, without gangrene: Secondary | ICD-10-CM | POA: Insufficient documentation

## 2020-09-29 DIAGNOSIS — E785 Hyperlipidemia, unspecified: Secondary | ICD-10-CM | POA: Insufficient documentation

## 2020-10-05 ENCOUNTER — Ambulatory Visit (INDEPENDENT_AMBULATORY_CARE_PROVIDER_SITE_OTHER): Payer: Medicaid Other | Admitting: Cardiology

## 2020-10-05 ENCOUNTER — Encounter: Payer: Self-pay | Admitting: Cardiology

## 2020-10-05 ENCOUNTER — Other Ambulatory Visit: Payer: Self-pay

## 2020-10-05 VITALS — BP 104/67 | HR 100 | Ht 68.0 in | Wt 242.0 lb

## 2020-10-05 DIAGNOSIS — I10 Essential (primary) hypertension: Secondary | ICD-10-CM

## 2020-10-05 DIAGNOSIS — Z8673 Personal history of transient ischemic attack (TIA), and cerebral infarction without residual deficits: Secondary | ICD-10-CM

## 2020-10-05 DIAGNOSIS — E782 Mixed hyperlipidemia: Secondary | ICD-10-CM

## 2020-10-05 DIAGNOSIS — IMO0002 Reserved for concepts with insufficient information to code with codable children: Secondary | ICD-10-CM

## 2020-10-05 DIAGNOSIS — E1140 Type 2 diabetes mellitus with diabetic neuropathy, unspecified: Secondary | ICD-10-CM | POA: Diagnosis not present

## 2020-10-05 DIAGNOSIS — R600 Localized edema: Secondary | ICD-10-CM | POA: Insufficient documentation

## 2020-10-05 DIAGNOSIS — E1165 Type 2 diabetes mellitus with hyperglycemia: Secondary | ICD-10-CM

## 2020-10-05 DIAGNOSIS — I2510 Atherosclerotic heart disease of native coronary artery without angina pectoris: Secondary | ICD-10-CM | POA: Diagnosis not present

## 2020-10-05 MED ORDER — FUROSEMIDE 20 MG PO TABS: 20.0000 mg | ORAL_TABLET | ORAL | 3 refills | Status: DC

## 2020-10-05 MED ORDER — POTASSIUM CHLORIDE CRYS ER 20 MEQ PO TBCR
20.0000 meq | EXTENDED_RELEASE_TABLET | ORAL | 3 refills | Status: DC
Start: 2020-10-05 — End: 2020-11-09
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
## 2020-10-05 NOTE — Patient Instructions (Signed)
Medication Instructions:  Your physician has recommended you make the following change in your medication:  START: Furosemide 20 mg take one tablet by mouth daily on Tuesday and Saturday. START: Potassium 20 meq take one tablet by mouth daily on Tuesday and Saturday.  *If you need a refill on your cardiac medications before your next appointment, please call your pharmacy*   Lab Work: Your physician recommends that you return for lab work in: TODAY BMP, Mag If you have labs (blood work) drawn today and your tests are completely normal, you will receive your results only by: Marland Kitchen MyChart Message (if you have MyChart) OR . A paper copy in the mail If you have any lab test that is abnormal or we need to change your treatment, we will call you to review the results.   Testing/Procedures: None   Follow-Up: At [facility], you and your health needs are our priority.  As part of our continuing mission to provide you with exceptional heart care, we have created designated Provider Care Teams.  These Care Teams include your primary Cardiologist (physician) and Advanced Practice Providers (APPs -  Physician Assistants and Nurse Practitioners) who all work together to provide you with the care you need, when you need it.  We recommend signing up for the patient portal called "MyChart".  Sign up information is provided on this After Visit Summary.  MyChart is used to connect with patients for Virtual Visits (Telemedicine).  Patients are able to view lab/test results, encounter notes, upcoming appointments, etc.  Non-urgent messages can be sent to your provider as well.   To learn more about what you can do with MyChart, go to [email or web address].    Your next appointment:   1 month(s)  The format for your next appointment:   In Person  Provider:   Thomasene Ripple, DO   Other Instructions

## 2020-10-06 LAB — BASIC METABOLIC PANEL
BUN/Creatinine Ratio: 16 (ref 9–20)
BUN: 12 mg/dL (ref 6–24)
CO2: 22 mmol/L (ref 20–29)
Calcium: 8.8 mg/dL (ref 8.7–10.2)
Chloride: 97 mmol/L (ref 96–106)
Creatinine, Ser: 0.76 mg/dL (ref 0.76–1.27)
GFR calc Af Amer: 116 mL/min/{1.73_m2} (ref >59)
GFR calc non Af Amer: 101 mL/min/{1.73_m2} (ref >59)
Glucose: 336 mg/dL — ABNORMAL HIGH (ref 65–99)
Potassium: 4.3 mmol/L (ref 3.5–5.2)
Sodium: 134 mmol/L (ref 134–144)

## 2020-10-06 LAB — MAGNESIUM: Magnesium: 1.9 mg/dL (ref 1.6–2.3)

## 2020-10-07 ENCOUNTER — Telehealth: Payer: Self-pay

## 2020-10-07 NOTE — Telephone Encounter (Signed)
-----   Message from Thomasene Ripple, DO sent at 10/07/2020  9:47 AM EDT -----   Blood glucose slightly elevated compared to 1 year ago, all the labs normal.

## 2020-10-07 NOTE — Telephone Encounter (Signed)
Spoke with patient regarding results and recommendation.  Patient verbalizes understanding and is agreeable to plan of care. Advised patient to call back with any issues or concerns.  

## 2020-10-07 NOTE — Telephone Encounter (Signed)
Tried calling patient. No answer and no voicemail set up for me to leave a message. 

## 2020-10-07 NOTE — Telephone Encounter (Signed)
-----   Message from Kardie Tobb, DO sent at 10/07/2020  9:47 AM EDT -----   Blood glucose slightly elevated compared to 1 year ago, all the labs normal. 

## 2020-10-12 ENCOUNTER — Ambulatory Visit (INDEPENDENT_AMBULATORY_CARE_PROVIDER_SITE_OTHER): Payer: Medicaid Other | Admitting: Podiatry

## 2020-10-12 DIAGNOSIS — Z5329 Procedure and treatment not carried out because of patient's decision for other reasons: Secondary | ICD-10-CM

## 2020-10-12 NOTE — Progress Notes (Signed)
Same day cancellation/no show 

## 2020-11-09 ENCOUNTER — Other Ambulatory Visit: Payer: Self-pay

## 2020-11-09 ENCOUNTER — Ambulatory Visit (INDEPENDENT_AMBULATORY_CARE_PROVIDER_SITE_OTHER): Payer: Medicaid Other | Admitting: Cardiology

## 2020-11-09 ENCOUNTER — Encounter: Payer: Self-pay | Admitting: Cardiology

## 2020-11-09 VITALS — BP 136/88 | HR 88 | Ht 68.0 in | Wt 252.6 lb

## 2020-11-09 DIAGNOSIS — IMO0002 Reserved for concepts with insufficient information to code with codable children: Secondary | ICD-10-CM

## 2020-11-09 DIAGNOSIS — E1140 Type 2 diabetes mellitus with diabetic neuropathy, unspecified: Secondary | ICD-10-CM | POA: Diagnosis not present

## 2020-11-09 DIAGNOSIS — I2510 Atherosclerotic heart disease of native coronary artery without angina pectoris: Secondary | ICD-10-CM

## 2020-11-09 DIAGNOSIS — I5033 Acute on chronic diastolic (congestive) heart failure: Secondary | ICD-10-CM | POA: Diagnosis not present

## 2020-11-09 DIAGNOSIS — I10 Essential (primary) hypertension: Secondary | ICD-10-CM

## 2020-11-09 DIAGNOSIS — E1165 Type 2 diabetes mellitus with hyperglycemia: Secondary | ICD-10-CM

## 2020-11-09 MED ORDER — FUROSEMIDE 40 MG PO TABS
40.0000 mg | ORAL_TABLET | Freq: Two times a day (BID) | ORAL | 3 refills | Status: DC
Start: 2020-11-09 — End: 2020-11-18

## 2020-11-09 MED ORDER — POTASSIUM CHLORIDE CRYS ER 20 MEQ PO TBCR
20.0000 meq | EXTENDED_RELEASE_TABLET | Freq: Two times a day (BID) | ORAL | 3 refills | Status: DC
Start: 2020-11-09 — End: 2021-03-29

## 2020-11-09 MED ORDER — FENOFIBRATE 150 MG PO CAPS: 150.0000 mg | ORAL_CAPSULE | Freq: Every day | ORAL | 3 refills | Status: DC

## 2020-11-09 MED ORDER — ISOSORBIDE MONONITRATE ER 30 MG PO TB24: 30.0000 mg | ORAL_TABLET | Freq: Every day | ORAL | 3 refills | Status: DC

## 2020-11-09 NOTE — Patient Instructions (Signed)
Medication Instructions:  Your physician has recommended you make the following change in your medication:  INCREASE: Imdur 30 mg take one tablet by mouth daily.  INCREASE: Furosemide 40 mg take one tablet by mouth twice daily.  INCREASE: Potassium 20 meq take one tablet by mouth twice daily.  START: Fenofibrate 150 mg take one tablet by mouth daily.  *If you need a refill on your cardiac medications before your next appointment, please call your pharmacy*   Lab Work: None If you have labs (blood work) drawn today and your tests are completely normal, you will receive your results only by: Marland Kitchen MyChart Message (if you have MyChart) OR . A paper copy in the mail If you have any lab test that is abnormal or we need to change your treatment, we will call you to review the results.   Testing/Procedures: None   Follow-Up: At [facility], you and your health needs are our priority.  As part of our continuing mission to provide you with exceptional heart care, we have created designated Provider Care Teams.  These Care Teams include your primary Cardiologist (physician) and Advanced Practice Providers (APPs -  Physician Assistants and Nurse Practitioners) who all work together to provide you with the care you need, when you need it.  We recommend signing up for the patient portal called "MyChart".  Sign up information is provided on this After Visit Summary.  MyChart is used to connect with patients for Virtual Visits (Telemedicine).  Patients are able to view lab/test results, encounter notes, upcoming appointments, etc.  Non-urgent messages can be sent to your provider as well.   To learn more about what you can do with MyChart, go to [email or web address].    Your next appointment:   1 week(s)  The format for your next appointment:   In Person  Provider:   Thomasene Ripple, DO   Other Instructions
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
## 2020-11-16 ENCOUNTER — Ambulatory Visit (INDEPENDENT_AMBULATORY_CARE_PROVIDER_SITE_OTHER): Payer: Medicaid Other | Admitting: Podiatry

## 2020-11-16 DIAGNOSIS — Z5329 Procedure and treatment not carried out because of patient's decision for other reasons: Secondary | ICD-10-CM

## 2020-11-16 NOTE — Progress Notes (Signed)
No show for appointment. Second no show in a row.

## 2020-11-18 ENCOUNTER — Other Ambulatory Visit: Payer: Self-pay

## 2020-11-18 ENCOUNTER — Telehealth: Payer: Self-pay | Admitting: Cardiology

## 2020-11-18 ENCOUNTER — Encounter: Payer: Self-pay | Admitting: Cardiology

## 2020-11-18 ENCOUNTER — Ambulatory Visit (INDEPENDENT_AMBULATORY_CARE_PROVIDER_SITE_OTHER): Payer: Medicaid Other | Admitting: Cardiology

## 2020-11-18 ENCOUNTER — Telehealth: Payer: Self-pay

## 2020-11-18 VITALS — BP 120/78 | HR 90 | Ht 68.0 in | Wt 242.8 lb

## 2020-11-18 DIAGNOSIS — I10 Essential (primary) hypertension: Secondary | ICD-10-CM

## 2020-11-18 DIAGNOSIS — Z8673 Personal history of transient ischemic attack (TIA), and cerebral infarction without residual deficits: Secondary | ICD-10-CM | POA: Diagnosis not present

## 2020-11-18 DIAGNOSIS — E1140 Type 2 diabetes mellitus with diabetic neuropathy, unspecified: Secondary | ICD-10-CM | POA: Diagnosis not present

## 2020-11-18 DIAGNOSIS — E1165 Type 2 diabetes mellitus with hyperglycemia: Secondary | ICD-10-CM

## 2020-11-18 DIAGNOSIS — IMO0002 Reserved for concepts with insufficient information to code with codable children: Secondary | ICD-10-CM

## 2020-11-18 DIAGNOSIS — I25118 Atherosclerotic heart disease of native coronary artery with other forms of angina pectoris: Secondary | ICD-10-CM

## 2020-11-18 DIAGNOSIS — I951 Orthostatic hypotension: Secondary | ICD-10-CM

## 2020-11-18 MED ORDER — RANOLAZINE ER 500 MG PO TB12: 500.0000 mg | ORAL_TABLET | Freq: Two times a day (BID) | ORAL | 3 refills | Status: DC

## 2020-11-18 MED ORDER — FUROSEMIDE 20 MG PO TABS: 20.0000 mg | ORAL_TABLET | ORAL | 3 refills | Status: DC

## 2020-11-18 NOTE — Patient Instructions (Signed)
Medication Instructions:  Your physician has recommended you make the following change in your medication:  STOP: Imdur HOLD: Lasix until Monday. Then start 20 mg daily on Tuesday and Saturday. START: Ranexa 500 mg take one tablet by mouth twice daily.  *If you need a refill on your cardiac medications before your next appointment, please call your pharmacy*   Lab Work: Your physician recommends that you return for lab work in: TODAY CMP, CBC, Mag If you have labs (blood work) drawn today and your tests are completely normal, you will receive your results only by: Marland Kitchen MyChart Message (if you have MyChart) OR . A paper copy in the mail If you have any lab test that is abnormal or we need to change your treatment, we will call you to review the results.   Testing/Procedures:    [facility]ARDIOVASCULAR DIVISION [facility] AT [place] [address] [place] [address] Dept: [phone] Loc: [phone]  COLDEN SAMARAS  11/18/2020  You are scheduled for a Cardiac Catheterization on Monday, December 6 with Dr. Nanetta Batty.  1. Please arrive at the [facility] (Main Entrance A) at [facility]: [address] [place], [place] [address] at 11:00 AM (This time is two hours before your procedure to ensure your preparation). Free valet parking service is available.   Special note: Every effort is made to have your procedure done on time. Please understand that emergencies sometimes delay scheduled procedures.  2. Diet: Do not eat solid foods after midnight.  The patient may have clear liquids until 5am upon the day of the procedure.  3. Labs: You will need to have blood drawn on: You had your labs drawn today 11/18/2020  4. Medication instructions in preparation for your procedure:   Contrast Allergy: No  Please take only half of your required insulin the night before your catheterization. Hold your insulin the morning of the procedure.     On the morning of your procedure, take your Aspirin and any morning medicines NOT listed above.  You may use sips of water.  5. Plan for one night stay--bring personal belongings. 6. Bring a current list of your medications and current insurance cards. 7. You MUST have a responsible person to drive you home. 8. Someone MUST be with you the first 24 hours after you arrive home or your discharge will be delayed. 9. Please wear clothes that are easy to get on and off and wear slip-on shoes.  Thank you for allowing [place] to care for you!   -- [facility]    Follow-Up: At [facility], you and your health needs are our priority.  As part of our continuing mission to provide you with exceptional heart care, we have created designated Provider Care Teams.  These Care Teams include your primary Cardiologist (physician) and Advanced Practice Providers (APPs -  Physician Assistants and Nurse Practitioners) who all work together to provide you with the care you need, when you need it.  We recommend signing up for the patient portal called "MyChart".  Sign up information is provided on this After Visit Summary.  MyChart is used to connect with patients for Virtual Visits (Telemedicine).  Patients are able to view lab/test results, encounter notes, upcoming appointments, etc.  Non-urgent messages can be sent to your provider as well.   To learn more about what you can do with MyChart, go to [email or web address].    Your next appointment:  3 week(s)  The format for your next appointment:   In Person  Provider:   Thomasene Ripple, DO   Other Instructions

## 2020-11-18 NOTE — Telephone Encounter (Signed)
Spoke to Samuel Bailey just now and she let me know the patients troponin T has come back and the rest of the labs have been completed at this time. They are now going to fax over the complete report.

## 2020-11-18 NOTE — Telephone Encounter (Signed)
Per Dr. Servando Salina all labs were normal except for the patients blood glucose. I tried calling the patient but there was no answer and no voicemail was available for me to leave a message.   I will also forward to the patients PCP so that they are aware and are able to treat the elevated blood glucose.

## 2020-11-18 NOTE — Telephone Encounter (Signed)
   Morrie Sheldon calling from [facility], she said they can't do the other lab work pt's need and they only did the troponin T. She said she can give results and just call them back

## 2020-11-18 NOTE — Addendum Note (Signed)
Addended by: Ulla Potash on: 11/18/2020 11:04 AM   Modules accepted: Orders
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
## 2020-11-19 ENCOUNTER — Telehealth: Payer: Self-pay | Admitting: *Deleted

## 2020-11-19 LAB — CBC
Hematocrit: 47.7 % (ref 37.5–51.0)
Hemoglobin: 16.1 g/dL (ref 13.0–17.7)
MCH: 29.9 pg (ref 26.6–33.0)
MCHC: 33.8 g/dL (ref 31.5–35.7)
MCV: 89 fL (ref 79–97)
Platelets: 160 10*3/uL (ref 150–450)
RBC: 5.39 x10E6/uL (ref 4.14–5.80)
RDW: 13.1 % (ref 11.6–15.4)
WBC: 6.3 10*3/uL (ref 3.4–10.8)

## 2020-11-19 LAB — TROPONIN T: Troponin T (Highly Sensitive): 15 ng/L (ref 0–22)

## 2020-11-19 LAB — MAGNESIUM: Magnesium: 1.7 mg/dL (ref 1.6–2.3)

## 2020-11-19 LAB — BASIC METABOLIC PANEL
BUN/Creatinine Ratio: 15 (ref 9–20)
BUN: 15 mg/dL (ref 6–24)
CO2: 22 mmol/L (ref 20–29)
Calcium: 8.8 mg/dL (ref 8.7–10.2)
Chloride: 96 mmol/L (ref 96–106)
Creatinine, Ser: 0.99 mg/dL (ref 0.76–1.27)
GFR calc Af Amer: 97 mL/min/{1.73_m2} (ref >59)
GFR calc non Af Amer: 84 mL/min/{1.73_m2} (ref >59)
Glucose: 345 mg/dL — ABNORMAL HIGH (ref 65–99)
Potassium: 4.4 mmol/L (ref 3.5–5.2)
Sodium: 133 mmol/L — ABNORMAL LOW (ref 134–144)

## 2020-11-19 NOTE — Telephone Encounter (Addendum)
Pt contacted pre-catheterization scheduled at [facility] for: Monday November 23, 2020 1 PM Verified arrival time and place: [facility] Main Entrance [facility]) at: 11 AM   No solid food after midnight prior to cath, clear liquids until 5 AM day of procedure.  Hold: Insulin-AM of procedure-1/2 usual Insulin HS prior to procedure Lasix/KCl-pt states he is not taking at this time.  Except hold medications AM meds can be  taken pre-cath with sips of water including: ASA 81 mg   Confirmed patient has responsible adult to drive home post procedure and be with patient first 24 hours after arriving home: * see notes below  You are allowed ONE visitor in the waiting room during the time you are at the hospital for your procedure. Both you and your visitor must wear a mask once you enter the hospital.       COVID-19 Pre-Screening Questions:  . In the past 14 days have you had any symptoms concerning for COVID-19 infection (fever, chills, cough, or new shortness of breath)? no . In the past 14 days have you been around anyone with known Covid 19? No  I reviewed procedure/mask/visitor instructions, COVID-19 questions with patient.   *Pt states he is having a hard time arranging transportation to and from [facility]  11/23/20.  -Pt states he does have transportation to pre-procedure COVID-19 test 11/21/20 and would have responsible adult to be with him first 24 hours after arriving home if he is same day discharge 11/23/20. -Pt agreed to me contacting Annice Pih, LCSW, to see if she would be able to help with transportation for patient to and from [facility] 11/23/20-arrival time 11 AM/procedure time 1 PM- I have made this referral.

## 2020-11-20 ENCOUNTER — Telehealth: Payer: Self-pay

## 2020-11-20 ENCOUNTER — Telehealth: Payer: Self-pay | Admitting: Licensed Clinical Social Worker

## 2020-11-20 NOTE — Telephone Encounter (Signed)
-----   Message from Florina Ou sent at 11/20/2020 11:33 AM EST ----- Regarding: RE: Cath for Monday Lequita Halt,  If you could reach out to the patient and let him know. I appreciate it, I usually do not reach out to the patient.   Thanks so much,  Marchelle Folks  ----- Message ----- From: Felecia Jan, RN Sent: 11/20/2020  11:25 AM EST To: Florina Ou Subject: FW: Cath for Monday                            Marchelle Folks,   Do you want me to contact the patient to let him know that this will be canceled for now or have you already notified him?  Thanks,  Lequita Halt, RN ----- Message ----- From: Thomasene Ripple, DO Sent: 11/20/2020  11:23 AM EST To: Florina Ou, Felecia Jan, RN, # Subject: RE: Cath for Monday                            Thanks for letting us know Marchelle Folks.Marchelle Folks, I think we should try to appeal in my note I specify that he is very high risk and even if we have the stress test if is negative it may be a false negative.  Let us try that and if we have no positive feedback from them then we can go the other way around  Thank you so much for working on this for [place] ----- Message ----- From: Florina Ou Sent: 11/20/2020  10:35 AM EST To: Thomasene Ripple, DO, Ulla Potash, CMA Subject: Cath for Monday                                Good morning,   We will need to postpone this patient's procedure on Monday. His medicaid plan has denied the request. As of now, it's not a covered benefit until the patient has a stress test.   I can appeal without having the stress test done, however, the appeal will not be completed until after Monday.  Please let me know how you would like to proceed.   Thank you,  Marchelle Folks

## 2020-11-20 NOTE — Telephone Encounter (Signed)
Spoke to the patient just now just to check in and see how he was feeling. I tried calling him yesterday and was not able to reach him which was concerning to me. He let me know that yesterday he rested a lot and he is feeling a lot better today. I went over his appointment dates and times with him for his COVID swab and his Cath coming up and he verbalizes understanding and states he will be there to get it done.   I advised for him to please call us back if he has any issues or concerns that arise and to proceed to the ED if his symptoms were to worsen over the weekend. He verbalizes understanding and thanks me for calling him this morning.

## 2020-11-20 NOTE — Telephone Encounter (Signed)
CSW received referral to assist patient with transportation to appointment on Monday. Patient is to arrive at the hospital at 11am. CSW made arrangements with [facility] [facility] [phone]) to transport and patient reports that he has a family member to stay with him at home when he returns from procedure. CSW will be off on Monday so please contact Rosetta Posner, LCSW [phone] with any concerns regarding transport. Patient verbalizes plan and denies any other concerns at this time. CSW available if needed. Lasandra Beech, LCSW, CCSW-MCS [phone]

## 2020-11-20 NOTE — Telephone Encounter (Signed)
CSW contacted Danny at [facility] [facility] to inform that patient's procedure was cancelled and to cancel transportation arrangements. CSW will contact when procedure is rescheduled. Lasandra Beech, LCSW, CCSW-MCS [phone]

## 2020-11-20 NOTE — Telephone Encounter (Signed)
Spoke to the patient just now and let him know that his cath will have to be postponed to a later date in order to allow for this appeal to be completed. I canceled both his COVID screening and the cath.   I encouraged the patient to call back with any issues or concerns he might have.

## 2020-11-21 ENCOUNTER — Other Ambulatory Visit (HOSPITAL_COMMUNITY): Payer: Medicaid Other

## 2020-11-23 ENCOUNTER — Ambulatory Visit (HOSPITAL_COMMUNITY): Admission: RE | Admit: 2020-11-23 | Payer: Medicaid Other | Source: Home / Self Care | Admitting: Cardiovascular Disease

## 2020-11-23 ENCOUNTER — Encounter (HOSPITAL_COMMUNITY): Admission: RE | Payer: Self-pay | Source: Home / Self Care

## 2020-11-23 SURGERY — LEFT HEART CATH AND CORONARY ANGIOGRAPHY
Anesthesia: LOCAL

## 2020-11-24 ENCOUNTER — Telehealth: Payer: Self-pay

## 2020-11-24 NOTE — Telephone Encounter (Signed)
Tried calling patient. No answer and no voicemail set up for me to leave a message. 

## 2020-11-24 NOTE — Telephone Encounter (Signed)
-----   Message from Kardie Tobb, DO sent at 11/24/2020 10:41 AM EST ----- Regarding: RE: Cath for Monday Thanks for follow ing up Amanda.   Symia Herdt,   Please let Mr. Yorks know that we would have to set him up with a Pharmacologic nuclear stress test due to the circumstances.   Kardie  ----- Message ----- From: Hepler, Amanda Sent: 11/24/2020   8:47 AM EST To: Kardie Tobb, DO, Hartlyn Reigel I Lakesia Dahle, RN, # Subject: RE: Cath for Monday                            Good morning,  I just spoke with [employer] regarding the denial and they have upheld the decision. The reasoning was that other "appropriate" testing should be completed first. They deemed the cath as not medically necessary.   Thank you all,  Amanda  ----- Message ----- From: Tobb, Kardie, DO Sent: 11/20/2020  11:23 AM EST To: Amanda Hepler, Effie Janoski I Melvine Julin, RN, # Subject: RE: Cath for Monday                            Thanks for letting us know Amanda.Amanda, I think we should try to appeal in my note I specify that he is very high risk and even if we have the stress test if is negative it may be a false negative.  Let us try that and if we have no positive feedback from them then we can go the other way around  Thank you so much for working on this for us ----- Message ----- From: Hepler, Amanda Sent: 11/20/2020  10:35 AM EST To: Kardie Tobb, DO, Candice Cook, CMA Subject: Cath for Monday                                Good morning,   We will need to postpone this patient's procedure on Monday. His medicaid plan has denied the request. As of now, it's not a covered benefit until the patient has a stress test.   I can appeal without having the stress test done, however, the appeal will not be completed until after Monday.  Please let me know how you would like to proceed.   Thank you,  Amanda      

## 2020-11-25 ENCOUNTER — Telehealth: Payer: Self-pay

## 2020-11-25 NOTE — Telephone Encounter (Signed)
Tried calling patient. No answer and no voicemail set up for me to leave a message. 

## 2020-11-25 NOTE — Telephone Encounter (Signed)
-----   Message from Kardie Tobb, DO sent at 11/24/2020 10:41 AM EST ----- Regarding: RE: Cath for Monday Thanks for follow ing up Amanda.   Glendene Wyer,   Please let Mr. Herskowitz know that we would have to set him up with a Pharmacologic nuclear stress test due to the circumstances.   Kardie  ----- Message ----- From: Hepler, Amanda Sent: 11/24/2020   8:47 AM EST To: Kardie Tobb, DO, Paddy Walthall I Emmanuelle Coxe, RN, # Subject: RE: Cath for Monday                            Good morning,  I just spoke with [employer] regarding the denial and they have upheld the decision. The reasoning was that other "appropriate" testing should be completed first. They deemed the cath as not medically necessary.   Thank you all,  Amanda  ----- Message ----- From: Tobb, Kardie, DO Sent: 11/20/2020  11:23 AM EST To: Amanda Hepler, Ruby Logiudice I Kla Bily, RN, # Subject: RE: Cath for Monday                            Thanks for letting us know Amanda.Amanda, I think we should try to appeal in my note I specify that he is very high risk and even if we have the stress test if is negative it may be a false negative.  Let us try that and if we have no positive feedback from them then we can go the other way around  Thank you so much for working on this for us ----- Message ----- From: Hepler, Amanda Sent: 11/20/2020  10:35 AM EST To: Kardie Tobb, DO, Candice Cook, CMA Subject: Cath for Monday                                Good morning,   We will need to postpone this patient's procedure on Monday. His medicaid plan has denied the request. As of now, it's not a covered benefit until the patient has a stress test.   I can appeal without having the stress test done, however, the appeal will not be completed until after Monday.  Please let me know how you would like to proceed.   Thank you,  Amanda      

## 2020-11-26 ENCOUNTER — Telehealth: Payer: Self-pay

## 2020-11-26 ENCOUNTER — Ambulatory Visit (INDEPENDENT_AMBULATORY_CARE_PROVIDER_SITE_OTHER): Payer: Medicaid Other | Admitting: Podiatry

## 2020-11-26 ENCOUNTER — Encounter: Payer: Self-pay | Admitting: Podiatry

## 2020-11-26 ENCOUNTER — Other Ambulatory Visit: Payer: Self-pay

## 2020-11-26 DIAGNOSIS — M14671 Charcot's joint, right ankle and foot: Secondary | ICD-10-CM

## 2020-11-26 DIAGNOSIS — S93324D Dislocation of tarsometatarsal joint of right foot, subsequent encounter: Secondary | ICD-10-CM | POA: Diagnosis not present

## 2020-11-26 NOTE — Telephone Encounter (Signed)
-----   Message from Thomasene Ripple, DO sent at 11/24/2020 10:41 AM EST ----- Regarding: RE: Cath for Monday Thanks for follow ing up [place].   Lequita Halt,   Please let Mr. Baik know that we would have to set him up with a Pharmacologic nuclear stress test due to the circumstances.   Lavona Mound  ----- Message ----- From: Florina Ou Sent: 11/24/2020   8:47 AM EST To: Thomasene Ripple, DO, Felecia Jan, RN, # Subject: RE: Cath for Monday                            Good morning,  I just spoke with [facility] regarding the denial and they have upheld the decision. The reasoning was that other "appropriate" testing should be completed first. They deemed the cath as not medically necessary.   Thank you all,  Marchelle Folks  ----- Message ----- From: Thomasene Ripple, DO Sent: 11/20/2020  11:23 AM EST To: Florina Ou, Felecia Jan, RN, # Subject: RE: Cath for Monday                            Thanks for letting us know Marchelle Folks.Marchelle Folks, I think we should try to appeal in my note I specify that he is very high risk and even if we have the stress test if is negative it may be a false negative.  Let us try that and if we have no positive feedback from them then we can go the other way around  Thank you so much for working on this for [place] ----- Message ----- From: Florina Ou Sent: 11/20/2020  10:35 AM EST To: Thomasene Ripple, DO, Ulla Potash, CMA Subject: Cath for Monday                                Good morning,   We will need to postpone this patient's procedure on Monday. His medicaid plan has denied the request. As of now, it's not a covered benefit until the patient has a stress test.   I can appeal without having the stress test done, however, the appeal will not be completed until after Monday.  Please let me know how you would like to proceed.   Thank you,  Marchelle Folks

## 2020-11-26 NOTE — Progress Notes (Signed)
  Subjective:  Patient ID: Samuel Bailey, male    DOB: 03/20/1961,  MRN: [id number]  Chief Complaint  Patient presents with  . Foot Problem    My right foot is doing better and is not as swollen as bad and I have been very sick     59 y.o. male presents for f/u of Charcot foot. Hx as above confirmed with patient.  Objective:  Physical Exam: warm, good capillary refill, no trophic changes or ulcerative lesions, normal DP and PT pulses and normal sensory exam.  Right Foot: No active warmth, erythema or edema to the right foot. Still with deformity at the medial arch; deformity medially with obvious bone dislocation, pain to palpation but no crepitus at the mid tarsal joints.  Assessment:   1. Charcot's joint of right foot   2. Lisfranc dislocation, right, subsequent encounter      Plan:  Patient was evaluated and treated and all questions answered.  Charcot foot right foot -Appears stable and coalesced.  -Offered rx for CROW boot or custom shoes. Pt declined. -Would benefit from surgical intervention at a later date if sugars controlled.  No follow-ups on file.

## 2020-11-26 NOTE — Telephone Encounter (Signed)
Tried calling patient. No answer and no voicemail set up for me to leave a message.  I am going to close this phone note at this time. I will send the patient a letter informing him on this. He also has an appointment set up for 12/10/20 with Dr. Servando Salina.

## 2020-12-01 ENCOUNTER — Telehealth: Payer: Self-pay | Admitting: Cardiology

## 2020-12-01 NOTE — Telephone Encounter (Signed)
Maralyn Sago called in stated that she called and the only info she could get as the provider line [phone] , she stated that they would require our office to call to get that info.  They would not give it to her

## 2020-12-01 NOTE — Telephone Encounter (Signed)
Sarah from [facility] called in and stated that they have been trying to get the test that was sch on 12/6 autherized for this pt.  He cancelled due to ins denying it.  Per Maralyn Sago they will approve it if pt has a few other test 1st.  She is going to find out what those test are and call back with that info.  She would like for [place] to forward that to Dr Servando Salina so those test can be ordered and the pt will be approved for that procedure  he was suppose to have.    Her number is [phone]

## 2020-12-10 ENCOUNTER — Encounter: Payer: Self-pay | Admitting: Cardiology

## 2020-12-10 ENCOUNTER — Encounter (HOSPITAL_COMMUNITY): Payer: Self-pay

## 2020-12-10 ENCOUNTER — Inpatient Hospital Stay (HOSPITAL_COMMUNITY)
Admission: EM | Admit: 2020-12-10 | Discharge: 2020-12-14 | DRG: 286 | Disposition: A | Discharge status: Home / Self Care | Payer: Medicaid Other | Attending: Cardiology | Admitting: Cardiology

## 2020-12-10 ENCOUNTER — Ambulatory Visit (INDEPENDENT_AMBULATORY_CARE_PROVIDER_SITE_OTHER): Payer: Medicaid Other | Admitting: Cardiology

## 2020-12-10 ENCOUNTER — Other Ambulatory Visit: Payer: Self-pay

## 2020-12-10 ENCOUNTER — Emergency Department (HOSPITAL_COMMUNITY): Payer: Medicaid Other

## 2020-12-10 VITALS — BP 150/84 | HR 90 | Ht 68.0 in | Wt 224.8 lb

## 2020-12-10 DIAGNOSIS — R0609 Other forms of dyspnea: Secondary | ICD-10-CM

## 2020-12-10 DIAGNOSIS — Z79899 Other long term (current) drug therapy: Secondary | ICD-10-CM

## 2020-12-10 DIAGNOSIS — J440 Chronic obstructive pulmonary disease with acute lower respiratory infection: Secondary | ICD-10-CM | POA: Diagnosis present

## 2020-12-10 DIAGNOSIS — I25110 Atherosclerotic heart disease of native coronary artery with unstable angina pectoris: Principal | ICD-10-CM | POA: Diagnosis present

## 2020-12-10 DIAGNOSIS — E782 Mixed hyperlipidemia: Secondary | ICD-10-CM | POA: Diagnosis not present

## 2020-12-10 DIAGNOSIS — E7800 Pure hypercholesterolemia, unspecified: Secondary | ICD-10-CM | POA: Diagnosis present

## 2020-12-10 DIAGNOSIS — E1165 Type 2 diabetes mellitus with hyperglycemia: Secondary | ICD-10-CM

## 2020-12-10 DIAGNOSIS — I2510 Atherosclerotic heart disease of native coronary artery without angina pectoris: Secondary | ICD-10-CM

## 2020-12-10 DIAGNOSIS — IMO0002 Reserved for concepts with insufficient information to code with codable children: Secondary | ICD-10-CM | POA: Diagnosis present

## 2020-12-10 DIAGNOSIS — I200 Unstable angina: Secondary | ICD-10-CM

## 2020-12-10 DIAGNOSIS — Z6836 Body mass index (BMI) 36.0-36.9, adult: Secondary | ICD-10-CM | POA: Diagnosis not present

## 2020-12-10 DIAGNOSIS — R0600 Dyspnea, unspecified: Secondary | ICD-10-CM | POA: Diagnosis not present

## 2020-12-10 DIAGNOSIS — I10 Essential (primary) hypertension: Secondary | ICD-10-CM | POA: Diagnosis present

## 2020-12-10 DIAGNOSIS — Z888 Allergy status to other drugs, medicaments and biological substances status: Secondary | ICD-10-CM

## 2020-12-10 DIAGNOSIS — F419 Anxiety disorder, unspecified: Secondary | ICD-10-CM | POA: Diagnosis present

## 2020-12-10 DIAGNOSIS — Z833 Family history of diabetes mellitus: Secondary | ICD-10-CM

## 2020-12-10 DIAGNOSIS — Z7982 Long term (current) use of aspirin: Secondary | ICD-10-CM

## 2020-12-10 DIAGNOSIS — J189 Pneumonia, unspecified organism: Secondary | ICD-10-CM | POA: Diagnosis present

## 2020-12-10 DIAGNOSIS — J449 Chronic obstructive pulmonary disease, unspecified: Secondary | ICD-10-CM | POA: Diagnosis not present

## 2020-12-10 DIAGNOSIS — E1169 Type 2 diabetes mellitus with other specified complication: Secondary | ICD-10-CM | POA: Diagnosis not present

## 2020-12-10 DIAGNOSIS — R079 Chest pain, unspecified: Secondary | ICD-10-CM | POA: Diagnosis not present

## 2020-12-10 DIAGNOSIS — Z8673 Personal history of transient ischemic attack (TIA), and cerebral infarction without residual deficits: Secondary | ICD-10-CM

## 2020-12-10 DIAGNOSIS — K219 Gastro-esophageal reflux disease without esophagitis: Secondary | ICD-10-CM | POA: Diagnosis present

## 2020-12-10 DIAGNOSIS — E669 Obesity, unspecified: Secondary | ICD-10-CM | POA: Diagnosis present

## 2020-12-10 DIAGNOSIS — Z794 Long term (current) use of insulin: Secondary | ICD-10-CM | POA: Diagnosis not present

## 2020-12-10 DIAGNOSIS — Z825 Family history of asthma and other chronic lower respiratory diseases: Secondary | ICD-10-CM | POA: Diagnosis not present

## 2020-12-10 DIAGNOSIS — J188 Other pneumonia, unspecified organism: Secondary | ICD-10-CM

## 2020-12-10 DIAGNOSIS — E785 Hyperlipidemia, unspecified: Secondary | ICD-10-CM | POA: Diagnosis present

## 2020-12-10 DIAGNOSIS — Z8249 Family history of ischemic heart disease and other diseases of the circulatory system: Secondary | ICD-10-CM

## 2020-12-10 DIAGNOSIS — I209 Angina pectoris, unspecified: Secondary | ICD-10-CM | POA: Diagnosis not present

## 2020-12-10 DIAGNOSIS — Z20822 Contact with and (suspected) exposure to covid-19: Secondary | ICD-10-CM | POA: Diagnosis present

## 2020-12-10 DIAGNOSIS — E119 Type 2 diabetes mellitus without complications: Secondary | ICD-10-CM | POA: Diagnosis present

## 2020-12-10 DIAGNOSIS — E1140 Type 2 diabetes mellitus with diabetic neuropathy, unspecified: Secondary | ICD-10-CM | POA: Diagnosis present

## 2020-12-10 DIAGNOSIS — G40909 Epilepsy, unspecified, not intractable, without status epilepticus: Secondary | ICD-10-CM | POA: Diagnosis present

## 2020-12-10 DIAGNOSIS — Z800 Family history of malignant neoplasm of digestive organs: Secondary | ICD-10-CM

## 2020-12-10 DIAGNOSIS — Z8042 Family history of malignant neoplasm of prostate: Secondary | ICD-10-CM

## 2020-12-10 DIAGNOSIS — Z87891 Personal history of nicotine dependence: Secondary | ICD-10-CM | POA: Diagnosis not present

## 2020-12-10 DIAGNOSIS — G8929 Other chronic pain: Secondary | ICD-10-CM | POA: Diagnosis present

## 2020-12-10 DIAGNOSIS — Z823 Family history of stroke: Secondary | ICD-10-CM

## 2020-12-10 LAB — BASIC METABOLIC PANEL
Anion gap: 11 (ref 5–15)
BUN: 7 mg/dL (ref 6–20)
CO2: 22 mmol/L (ref 22–32)
Calcium: 9.2 mg/dL (ref 8.9–10.3)
Chloride: 100 mmol/L (ref 98–111)
Creatinine, Ser: 0.78 mg/dL (ref 0.61–1.24)
GFR, Estimated: >60 mL/min (ref >60)
Glucose, Bld: 338 mg/dL — ABNORMAL HIGH (ref 70–99)
Potassium: 3.7 mmol/L (ref 3.5–5.1)
Sodium: 133 mmol/L — ABNORMAL LOW (ref 135–145)

## 2020-12-10 LAB — CBC WITH DIFFERENTIAL/PLATELET
Abs Immature Granulocytes: 0.09 10*3/uL — ABNORMAL HIGH (ref 0.00–0.07)
Basophils Absolute: 0.1 10*3/uL (ref 0.0–0.1)
Basophils Relative: 1 %
Eosinophils Absolute: 0.2 10*3/uL (ref 0.0–0.5)
Eosinophils Relative: 2 %
HCT: 41.5 % (ref 39.0–52.0)
Hemoglobin: 14 g/dL (ref 13.0–17.0)
Immature Granulocytes: 1 %
Lymphocytes Relative: 36 %
Lymphs Abs: 3.1 10*3/uL (ref 0.7–4.0)
MCH: 28.9 pg (ref 26.0–34.0)
MCHC: 33.7 g/dL (ref 30.0–36.0)
MCV: 85.6 fL (ref 80.0–100.0)
Monocytes Absolute: 0.7 10*3/uL (ref 0.1–1.0)
Monocytes Relative: 8 %
Neutro Abs: 4.4 10*3/uL (ref 1.7–7.7)
Neutrophils Relative %: 52 %
Platelets: 276 10*3/uL (ref 150–400)
RBC: 4.85 MIL/uL (ref 4.22–5.81)
RDW: 12.8 % (ref 11.5–15.5)
WBC: 8.5 10*3/uL (ref 4.0–10.5)
nRBC: 0 % (ref 0.0–0.2)

## 2020-12-10 LAB — BRAIN NATRIURETIC PEPTIDE: B Natriuretic Peptide: 26.7 pg/mL (ref 0.0–100.0)

## 2020-12-10 LAB — TROPONIN I (HIGH SENSITIVITY)
Troponin I (High Sensitivity): 6 ng/L (ref <18)
Troponin I (High Sensitivity): 7 ng/L (ref <18)

## 2020-12-10 LAB — RESP PANEL BY RT-PCR (FLU A&B, COVID) ARPGX2
Influenza A by PCR: NEGATIVE
Influenza B by PCR: NEGATIVE
SARS Coronavirus 2 by RT PCR: NEGATIVE

## 2020-12-10 LAB — HEMOGLOBIN A1C
Hgb A1c MFr Bld: 13.4 % — ABNORMAL HIGH (ref 4.8–5.6)
Mean Plasma Glucose: 337.88 mg/dL

## 2020-12-10 LAB — GLUCOSE, CAPILLARY: Glucose-Capillary: 326 mg/dL — ABNORMAL HIGH (ref 70–99)

## 2020-12-10 MED ORDER — NITROGLYCERIN 0.4 MG SL SUBL: 0.4000 mg | SUBLINGUAL_TABLET | SUBLINGUAL | Status: DC | PRN

## 2020-12-10 MED ORDER — ISOSORBIDE MONONITRATE ER 30 MG PO TB24
15.0000 mg | ORAL_TABLET | Freq: Every day | ORAL | Status: DC
  Administered 2020-12-11 – 2020-12-13 (×3): 15 mg via ORAL
  Filled 2020-12-10 (×4): qty 1

## 2020-12-10 MED ORDER — INSULIN GLARGINE 100 UNIT/ML ~~LOC~~ SOLN
60.0000 [IU] | Freq: Two times a day (BID) | SUBCUTANEOUS | Status: DC
  Administered 2020-12-11 (×3): 60 [IU] via SUBCUTANEOUS
  Filled 2020-12-10 (×6): qty 0.6

## 2020-12-10 MED ORDER — ALPRAZOLAM 0.25 MG PO TABS: 0.2500 mg | ORAL_TABLET | Freq: Two times a day (BID) | ORAL | Status: DC | PRN

## 2020-12-10 MED ORDER — ZOLPIDEM TARTRATE 5 MG PO TABS
5.0000 mg | ORAL_TABLET | Freq: Every evening | ORAL | Status: DC | PRN
  Administered 2020-12-14: 02:00:00 5 mg via ORAL
  Filled 2020-12-10: qty 1

## 2020-12-10 MED ORDER — HEPARIN BOLUS VIA INFUSION
4000.0000 [IU] | Freq: Once | INTRAVENOUS | Status: AC
  Administered 2020-12-10: 21:00:00 4000 [IU] via INTRAVENOUS
  Filled 2020-12-10: qty 4000

## 2020-12-10 MED ORDER — GABAPENTIN 300 MG PO CAPS
300.0000 mg | ORAL_CAPSULE | Freq: Three times a day (TID) | ORAL | Status: DC
  Administered 2020-12-10 – 2020-12-14 (×11): 300 mg via ORAL
  Filled 2020-12-10 (×11): qty 1

## 2020-12-10 MED ORDER — PANTOPRAZOLE SODIUM 40 MG PO TBEC
40.0000 mg | DELAYED_RELEASE_TABLET | Freq: Two times a day (BID) | ORAL | Status: DC
  Administered 2020-12-11 – 2020-12-14 (×7): 40 mg via ORAL
  Filled 2020-12-10 (×7): qty 1

## 2020-12-10 MED ORDER — ASPIRIN 81 MG PO CHEW
324.0000 mg | CHEWABLE_TABLET | ORAL | Status: AC
Start: 2020-12-10 — End: 2020-12-11

## 2020-12-10 MED ORDER — ATORVASTATIN CALCIUM 40 MG PO TABS
40.0000 mg | ORAL_TABLET | Freq: Every day | ORAL | Status: DC
  Administered 2020-12-11 – 2020-12-13 (×3): 40 mg via ORAL
  Filled 2020-12-10 (×3): qty 1

## 2020-12-10 MED ORDER — ASPIRIN 300 MG RE SUPP: 300.0000 mg | RECTAL | Status: AC

## 2020-12-10 MED ORDER — RANOLAZINE ER 500 MG PO TB12
500.0000 mg | ORAL_TABLET | Freq: Two times a day (BID) | ORAL | Status: DC
  Administered 2020-12-10 – 2020-12-14 (×8): 500 mg via ORAL
  Filled 2020-12-10 (×10): qty 1

## 2020-12-10 MED ORDER — HEPARIN (PORCINE) 25000 UT/250ML-% IV SOLN
2600.0000 [IU]/h | INTRAVENOUS | Status: DC
  Administered 2020-12-10: 21:00:00 1150 [IU]/h via INTRAVENOUS
  Administered 2020-12-11: 22:00:00 2100 [IU]/h via INTRAVENOUS
  Administered 2020-12-11: 12:00:00 1500 [IU]/h via INTRAVENOUS
  Administered 2020-12-12 – 2020-12-14 (×5): 2400 [IU]/h via INTRAVENOUS
  Filled 2020-12-10 (×11): qty 250

## 2020-12-10 MED ORDER — INSULIN ASPART 100 UNIT/ML ~~LOC~~ SOLN
5.0000 [IU] | Freq: Once | SUBCUTANEOUS | Status: AC
  Administered 2020-12-10: 21:00:00 5 [IU] via SUBCUTANEOUS

## 2020-12-10 MED ORDER — SODIUM CHLORIDE 0.9 % IV SOLN
500.0000 mg | Freq: Once | INTRAVENOUS | Status: AC
  Administered 2020-12-10: 21:00:00 500 mg via INTRAVENOUS
  Filled 2020-12-10: qty 500

## 2020-12-10 MED ORDER — ONDANSETRON HCL 4 MG/2ML IJ SOLN: 4.0000 mg | Freq: Four times a day (QID) | INTRAMUSCULAR | Status: DC | PRN

## 2020-12-10 MED ORDER — EZETIMIBE 10 MG PO TABS
10.0000 mg | ORAL_TABLET | Freq: Every day | ORAL | Status: DC
  Administered 2020-12-11 – 2020-12-14 (×4): 10 mg via ORAL
  Filled 2020-12-10 (×4): qty 1

## 2020-12-10 MED ORDER — TIOTROPIUM BROMIDE MONOHYDRATE 18 MCG IN CAPS
18.0000 ug | ORAL_CAPSULE | Freq: Every day | RESPIRATORY_TRACT | Status: DC
  Filled 2020-12-10: qty 5

## 2020-12-10 MED ORDER — ASPIRIN EC 81 MG PO TBEC
81.0000 mg | DELAYED_RELEASE_TABLET | Freq: Every day | ORAL | Status: DC
  Administered 2020-12-11 – 2020-12-14 (×4): 81 mg via ORAL
  Filled 2020-12-10 (×4): qty 1

## 2020-12-10 MED ORDER — CARVEDILOL 6.25 MG PO TABS
6.2500 mg | ORAL_TABLET | Freq: Two times a day (BID) | ORAL | Status: DC
  Administered 2020-12-10 – 2020-12-12 (×4): 6.25 mg via ORAL
  Filled 2020-12-10: qty 1
  Filled 2020-12-10: qty 2
  Filled 2020-12-10 (×2): qty 1

## 2020-12-10 MED ORDER — SODIUM CHLORIDE 0.9 % IV SOLN
1.0000 g | Freq: Once | INTRAVENOUS | Status: AC
  Administered 2020-12-10: 20:00:00 1 g via INTRAVENOUS
  Filled 2020-12-10: qty 10

## 2020-12-10 MED ORDER — INSULIN ASPART 100 UNIT/ML ~~LOC~~ SOLN
0.0000 [IU] | Freq: Three times a day (TID) | SUBCUTANEOUS | Status: DC
  Administered 2020-12-11: 09:00:00 8 [IU] via SUBCUTANEOUS
  Administered 2020-12-11: 12:00:00 5 [IU] via SUBCUTANEOUS

## 2020-12-10 MED ORDER — ACETAMINOPHEN 325 MG PO TABS
650.0000 mg | ORAL_TABLET | ORAL | Status: DC | PRN
  Administered 2020-12-14: 650 mg via ORAL
  Filled 2020-12-10: qty 2

## 2020-12-10 MED ORDER — UMECLIDINIUM BROMIDE 62.5 MCG/INH IN AEPB
1.0000 | INHALATION_SPRAY | Freq: Every day | RESPIRATORY_TRACT | Status: DC
  Administered 2020-12-11 – 2020-12-13 (×3): 1 via RESPIRATORY_TRACT
  Filled 2020-12-10: qty 7

## 2020-12-10 MED ORDER — FENOFIBRATE 54 MG PO TABS
54.0000 mg | ORAL_TABLET | Freq: Every day | ORAL | Status: DC
  Administered 2020-12-11 – 2020-12-14 (×4): 54 mg via ORAL
  Filled 2020-12-10 (×4): qty 1

## 2020-12-10 MED ORDER — ROFLUMILAST 500 MCG PO TABS
500.0000 ug | ORAL_TABLET | Freq: Every day | ORAL | Status: DC
  Administered 2020-12-11 – 2020-12-14 (×4): 500 ug via ORAL
  Filled 2020-12-10 (×4): qty 1

## 2020-12-10 NOTE — Progress Notes (Signed)
ANTICOAGULATION CONSULT NOTE - Initial Consult  Pharmacy Consult for heparin Indication: chest pain/ACS  Allergies  Allergen Reactions  . Lovastatin     diarrhea    Patient Measurements:   Heparin Dosing Weight: 90.4 kg   Vital Signs: Temp: 98 F (36.7 C) (12/23 1711) Temp Source: Oral (12/23 1711) BP: 109/71 (12/23 1900) Pulse Rate: 98 (12/23 1900)  Labs: Recent Labs    12/10/20 1732  HGB 14.0  HCT 41.5  PLT 276  CREATININE 0.78  TROPONINIHS 6    Estimated Creatinine Clearance: 116.5 mL/min (by C-G formula based on SCr of 0.78 mg/dL).   Medical History: Past Medical History:  Diagnosis Date  . Anxiety   . Blister of toe of right foot 12/02/2019  . Candidal intertrigo   . Cellulitis of right foot 10/31/2019  . Chest pain 10/31/2019  . Chronic headaches   . Chronic pain   . 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 Diabetic neuropathy (HCC) 12/02/2019  . Generalized weakness 12/02/2019  . GERD (gastroesophageal reflux disease)   . High cholesterol   . History of CVA (cerebrovascular accident) 12/02/2019  . History of peptic ulcer 12/02/2019  . HTN (hypertension)   . Hyperlipemia   . Hyperlipidemia   . Irreducible hernia of anterior abdominal wall   . 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 Lumbar radiculopathy   . Marijuana smoker 12/02/2019  . Mild CAD 02/25/2015   Heart cath 12/2014 [facility]:  Mid LAD 30% Mid RCA 30% nL LVEF>>> risk factor modification only   . Obesity (BMI 30-39.9) 12/02/2019  . Peripheral neuropathy   . Poorly controlled diabetes mellitus (HCC) 12/02/2019  . Right hip pain   . Sciatica of right side   . Seizure disorder (HCC)   . SIRS (systemic inflammatory response syndrome) (HCC) 12/02/2019  . Syncope and collapse 12/02/2019  . Tobacco use disorder 02/24/2015  . Uncontrolled type 2 diabetes with neuropathy (HCC)   . Unstable angina (HCC) 12/02/2019  . Wrist pain      Medications:  (Not in a hospital admission)   Assessment: 23 YOM who arrives via EMS from cardiologist office with chest pain and worsening SOB. Pharmacy consulted to start IV heparin for ACS.   H/H and Plt wnl. SCr wnl  Goal of Therapy:  Heparin level 0.3-0.7 units/ml Monitor platelets by anticoagulation protocol: Yes   Plan:  -Heparin 4000 units IV bolus followed by heparin infusion at 1150 units/hr  -F/u 6 hr HL -Monitor daily HL, CBC and s/s of bleeding   Vinnie Level, PharmD., BCPS, BCCCP Clinical Pharmacist Please refer to [facility] for unit-specific pharmacist
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
## 2020-12-10 NOTE — ED Triage Notes (Signed)
Patient arrives via ems from cardiologist with intermittent chest pain x1 month with worsening sob. EMS reports cardiology wanted to cath patient on 12/01 but insurance didn't approve. 324mg  aspirin and 2 nitro given by ems with relief in pain.
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
## 2020-12-11 ENCOUNTER — Inpatient Hospital Stay (HOSPITAL_COMMUNITY): Payer: Medicaid Other

## 2020-12-11 DIAGNOSIS — E1165 Type 2 diabetes mellitus with hyperglycemia: Secondary | ICD-10-CM

## 2020-12-11 DIAGNOSIS — R0600 Dyspnea, unspecified: Secondary | ICD-10-CM

## 2020-12-11 DIAGNOSIS — R079 Chest pain, unspecified: Secondary | ICD-10-CM

## 2020-12-11 DIAGNOSIS — Z794 Long term (current) use of insulin: Secondary | ICD-10-CM

## 2020-12-11 DIAGNOSIS — E785 Hyperlipidemia, unspecified: Secondary | ICD-10-CM

## 2020-12-11 DIAGNOSIS — J189 Pneumonia, unspecified organism: Secondary | ICD-10-CM

## 2020-12-11 DIAGNOSIS — E1169 Type 2 diabetes mellitus with other specified complication: Secondary | ICD-10-CM

## 2020-12-11 DIAGNOSIS — E1140 Type 2 diabetes mellitus with diabetic neuropathy, unspecified: Secondary | ICD-10-CM

## 2020-12-11 LAB — ECHOCARDIOGRAM COMPLETE
AR max vel: 3.79 cm2
AV Area VTI: 3.66 cm2
AV Area mean vel: 4.09 cm2
AV Mean grad: 2.6 mmHg
AV Peak grad: 5.4 mmHg
Ao pk vel: 1.16 m/s
Area-P 1/2: 3.42 cm2
Calc EF: 47.4 %
Height: 68 in
S' Lateral: 4 cm
Single Plane A2C EF: 54.2 %
Single Plane A4C EF: 37.6 %
Weight: 3685.32 oz

## 2020-12-11 LAB — COMPREHENSIVE METABOLIC PANEL
ALT: 11 U/L (ref 0–44)
AST: 10 U/L — ABNORMAL LOW (ref 15–41)
Albumin: 2.3 g/dL — ABNORMAL LOW (ref 3.5–5.0)
Alkaline Phosphatase: 57 U/L (ref 38–126)
Anion gap: 12 (ref 5–15)
BUN: 8 mg/dL (ref 6–20)
CO2: 24 mmol/L (ref 22–32)
Calcium: 8.8 mg/dL — ABNORMAL LOW (ref 8.9–10.3)
Chloride: 99 mmol/L (ref 98–111)
Creatinine, Ser: 0.88 mg/dL (ref 0.61–1.24)
GFR, Estimated: >60 mL/min (ref >60)
Glucose, Bld: 333 mg/dL — ABNORMAL HIGH (ref 70–99)
Potassium: 3.7 mmol/L (ref 3.5–5.1)
Sodium: 135 mmol/L (ref 135–145)
Total Bilirubin: 0.6 mg/dL (ref 0.3–1.2)
Total Protein: 5.6 g/dL — ABNORMAL LOW (ref 6.5–8.1)

## 2020-12-11 LAB — TROPONIN I (HIGH SENSITIVITY)
Troponin I (High Sensitivity): 7 ng/L (ref <18)
Troponin I (High Sensitivity): 7 ng/L (ref <18)

## 2020-12-11 LAB — GLUCOSE, CAPILLARY
Glucose-Capillary: 261 mg/dL — ABNORMAL HIGH (ref 70–99)
Glucose-Capillary: 214 mg/dL — ABNORMAL HIGH (ref 70–99)
Glucose-Capillary: 183 mg/dL — ABNORMAL HIGH (ref 70–99)

## 2020-12-11 LAB — CBC
HCT: 36.9 % — ABNORMAL LOW (ref 39.0–52.0)
Hemoglobin: 13 g/dL (ref 13.0–17.0)
MCH: 29.6 pg (ref 26.0–34.0)
MCHC: 35.2 g/dL (ref 30.0–36.0)
MCV: 84.1 fL (ref 80.0–100.0)
Platelets: 243 10*3/uL (ref 150–400)
RBC: 4.39 MIL/uL (ref 4.22–5.81)
RDW: 13 % (ref 11.5–15.5)
WBC: 6.5 10*3/uL (ref 4.0–10.5)
nRBC: 0 % (ref 0.0–0.2)

## 2020-12-11 LAB — HEPARIN LEVEL (UNFRACTIONATED)
Heparin Unfractionated: <0.1 IU/mL — ABNORMAL LOW (ref 0.30–0.70)
Heparin Unfractionated: <0.1 IU/mL — ABNORMAL LOW (ref 0.30–0.70)
Heparin Unfractionated: 0.13 IU/mL — ABNORMAL LOW (ref 0.30–0.70)

## 2020-12-11 LAB — HIV ANTIBODY (ROUTINE TESTING W REFLEX): HIV Screen 4th Generation wRfx: NONREACTIVE

## 2020-12-11 LAB — TSH: TSH: 2.505 u[IU]/mL (ref 0.350–4.500)

## 2020-12-11 MED ORDER — INSULIN ASPART 100 UNIT/ML ~~LOC~~ SOLN
0.0000 [IU] | Freq: Three times a day (TID) | SUBCUTANEOUS | Status: DC
  Administered 2020-12-11: 18:00:00 11 [IU] via SUBCUTANEOUS
  Administered 2020-12-12: 08:00:00 19 [IU] via SUBCUTANEOUS
  Administered 2020-12-12: 17:00:00 7 [IU] via SUBCUTANEOUS
  Administered 2020-12-12: 12:00:00 4 [IU] via SUBCUTANEOUS
  Administered 2020-12-13: 08:00:00 3 [IU] via SUBCUTANEOUS
  Administered 2020-12-13: 17:00:00 4 [IU] via SUBCUTANEOUS
  Administered 2020-12-13: 13:00:00 3 [IU] via SUBCUTANEOUS
  Administered 2020-12-14: 12:00:00 4 [IU] via SUBCUTANEOUS

## 2020-12-11 MED ORDER — HEPARIN BOLUS VIA INFUSION
2500.0000 [IU] | Freq: Once | INTRAVENOUS | Status: AC
  Administered 2020-12-11: 04:00:00 2500 [IU] via INTRAVENOUS
  Filled 2020-12-11: qty 2500

## 2020-12-11 MED ORDER — AMOXICILLIN-POT CLAVULANATE 875-125 MG PO TABS
1.0000 | ORAL_TABLET | Freq: Two times a day (BID) | ORAL | Status: DC
  Administered 2020-12-11 – 2020-12-14 (×7): 1 via ORAL
  Filled 2020-12-11 (×7): qty 1

## 2020-12-11 MED ORDER — AZITHROMYCIN 250 MG PO TABS
500.0000 mg | ORAL_TABLET | Freq: Every day | ORAL | Status: AC
  Administered 2020-12-11: 15:00:00 500 mg via ORAL
  Filled 2020-12-11: qty 2

## 2020-12-11 MED ORDER — AZITHROMYCIN 250 MG PO TABS
250.0000 mg | ORAL_TABLET | Freq: Every day | ORAL | Status: DC
  Administered 2020-12-12 – 2020-12-14 (×3): 250 mg via ORAL
  Filled 2020-12-11 (×3): qty 1

## 2020-12-11 MED ORDER — HEPARIN BOLUS VIA INFUSION
3000.0000 [IU] | Freq: Once | INTRAVENOUS | Status: AC
  Administered 2020-12-11: 12:00:00 3000 [IU] via INTRAVENOUS
  Filled 2020-12-11: qty 3000

## 2020-12-11 MED ORDER — INSULIN ASPART 100 UNIT/ML ~~LOC~~ SOLN
0.0000 [IU] | Freq: Every day | SUBCUTANEOUS | Status: DC
  Administered 2020-12-13: 22:00:00 2 [IU] via SUBCUTANEOUS

## 2020-12-11 MED ORDER — INSULIN ASPART 100 UNIT/ML ~~LOC~~ SOLN
4.0000 [IU] | Freq: Three times a day (TID) | SUBCUTANEOUS | Status: DC
  Administered 2020-12-11 – 2020-12-14 (×8): 4 [IU] via SUBCUTANEOUS

## 2020-12-11 MED ORDER — LIVING WELL WITH DIABETES BOOK
Freq: Once | Status: AC
  Filled 2020-12-11: qty 1

## 2020-12-11 NOTE — Progress Notes (Signed)
Progress Note  Patient Name: Samuel Bailey Date of Encounter: 12/11/2020  [facility] Cardiologist: Thomasene Ripple, DO   Subjective   Mostly notes shortness of breath even with shifting positions. Not having chest pain.  Inpatient Medications    Scheduled Meds: . aspirin  324 mg Oral NOW   Or  . aspirin  300 mg Rectal NOW  . aspirin EC  81 mg Oral Daily  . atorvastatin  40 mg Oral q1800  . carvedilol  6.25 mg Oral BID WC  . ezetimibe  10 mg Oral Daily  . fenofibrate  54 mg Oral Daily  . gabapentin  300 mg Oral TID  . insulin aspart  0-15 Units Subcutaneous TID WC  . insulin glargine  60 Units Subcutaneous BID  . isosorbide mononitrate  15 mg Oral Daily  . living well with diabetes book   Does not apply Once  . pantoprazole  40 mg Oral BID AC  . ranolazine  500 mg Oral BID  . roflumilast  500 mcg Oral Daily  . umeclidinium bromide  1 puff Inhalation Daily   Continuous Infusions: . heparin 1,500 Units/hr (12/11/20 0415)   PRN Meds: acetaminophen, ALPRAZolam, nitroGLYCERIN, ondansetron (ZOFRAN) IV, zolpidem   Vital Signs    Vitals:   12/10/20 2227 12/10/20 2227 12/11/20 0511 12/11/20 0735  BP:  117/74 126/84 111/76  Pulse:  97  84  Resp:  17 20 18   Temp:  97.8 F (36.6 C) 98.7 F (37.1 C) 98.2 F (36.8 C)  TempSrc:   Oral Oral  SpO2:  95%  98%  Weight: 104.6 kg  104.5 kg   Height: 5\' 8"  (1.727 m)       Intake/Output Summary (Last 24 hours) at 12/11/2020 1039 Last data filed at 12/11/2020 0300 Gross per 24 hour  Intake 444.8 ml  Output --  Net 444.8 ml   Last 3 Weights 12/11/2020 12/10/2020 12/10/2020  Weight (lbs) 230 lb 5.3 oz 230 lb 8 oz 224 lb 12.8 oz  Weight (kg) 104.478 kg 104.554 kg 101.969 kg      Telemetry    Sinus rhythm in the 80s.- Personally Reviewed  ECG    No new EKG- Personally Reviewed  Physical Exam   GEN: No acute distress.   Neck: No JVD Cardiac: RRR, no murmurs, rubs, or gallops.  Respiratory:  Mild decreased  interstitial lung sounds. Otherwise - nonlabored, good air movement no W/R/R. GI: Soft, nontender, non-distended -NABS MS: No edema; right foot deformity-clubfoot Neuro:  Nonfocal  Psych: Normal affect   Labs    High Sensitivity Troponin:   Recent Labs  Lab 12/10/20 1732 12/10/20 1933 12/10/20 2306 12/11/20 0253  TROPONINIHS 6 7 7 7       Chemistry Recent Labs  Lab 12/10/20 1732 12/11/20 0253  NA 133* 135  K 3.7 3.7  CL 100 99  CO2 22 24  GLUCOSE 338* 333*  BUN 7 8  CREATININE 0.78 0.88  CALCIUM 9.2 8.8*  PROT  --  5.6*  ALBUMIN  --  2.3*  AST  --  10*  ALT  --  11  ALKPHOS  --  57  BILITOT  --  0.6  GFRNONAA >60 >60  ANIONGAP 11 12     Hematology Recent Labs  Lab 12/10/20 1732 12/11/20 0253  WBC 8.5 6.5  RBC 4.85 4.39  HGB 14.0 13.0  HCT 41.5 36.9*  MCV 85.6 84.1  MCH 28.9 29.6  MCHC 33.7 35.2  RDW 12.8 13.0  PLT 276 243    BNP Recent Labs  Lab 12/10/20 1732  BNP 26.7     DDimer No results for input(s): DDIMER in the last 168 hours.   Radiology    DG Chest Portable 1 View  Result Date: 12/10/2020 CLINICAL DATA:  Chest pain. EXAM: PORTABLE CHEST 1 VIEW COMPARISON:  Chest x-ray 10/29/2019 FINDINGS: The heart size and mediastinal contours are within normal limits. Interval development of peripheral patchy airspace opacities. No pulmonary edema. No pleural effusion. No pneumothorax. No acute osseous abnormality. Redemonstration of retained shrapnel noted overlying the left shoulder. Left humeral fixation with screws. IMPRESSION: Multifocal pneumonia. COVID-19 infection not excluded. Followup PA and lateral chest X-ray is recommended in 3-4 weeks following therapy to ensure resolution and exclude underlying malignancy. Electronically Signed   By: Tish Frederickson M.D.   On: 12/10/2020 17:37    Cardiac Studies    Cardiac Cath 10/31/2019: Proximal to mid LAD 25%, distal RCA 40%.  Otherwise normal coronaries with normal LV function and  pressures.  Echocardiogram 10/31/2019: EF 6065%.  Borderline LVH.  Indeterminate filling pressures.  Essentially normal echo.  Inpatient echo pending  Patient Profile     59 y.o. male with history of known nonobstructive CAD as well as HTN, DM-2 and CAD who presented yesterday to cardiology-seen by Dr. Servando Salina for worsening dyspnea exertion and concern for unstable angina  Assessment & Plan    1. Unstable Angina (by clinical assessment):   Per plan - schedule Cardiac Cath on Monday.  ON IV Heparin infusion  ON Ranexa  Started on BB  & Statin  On Protonix 2. HTN - started on Carvedilol, BP stable now 3. HLD - on combination of Zetia fenofibrate and atorvastatin.  Watch for side effects. 4. DM-2  on insulin: Is on Lantus and NovoLog.  On Neurontin for neuropathy. 5. Chest x-ray suggesting pneumonia -remains afebrile without a white blood cell count, however chest x-ray does show patchy diffuse infiltrates suggestive of multiple pneumonia.\, COVID-19 test was negative.  I am concerned that is dyspnea may very well be more pulmonary than cardiac in nature.   We will consult TRH to evaluate/treat for potential atypical pneumonia    For questions or updates, please contact [facility] Please consult [email or web address] for contact info under        Signed, Bryan Lemma, MD  12/11/2020, 10:39 AM
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
## 2020-12-11 NOTE — Progress Notes (Signed)
ANTICOAGULATION CONSULT NOTE   Pharmacy Consult for heparin Indication: chest pain/ACS  Allergies  Allergen Reactions  . Lovastatin     diarrhea    Patient Measurements: Height: 5\' 8"  (172.7 cm) Weight: 104.6 kg (230 lb 8 oz) IBW/kg (Calculated) : 68.4 Heparin Dosing Weight: 90.4 kg   Vital Signs: Temp: 97.8 F (36.6 C) (12/23 2227) Temp Source: Oral (12/23 1711) BP: 117/74 (12/23 2227) Pulse Rate: 97 (12/23 2227)  Labs: Recent Labs    12/10/20 1732 12/10/20 1933 12/10/20 2306 12/11/20 0253  HGB 14.0  --   --  13.0  HCT 41.5  --   --  36.9*  PLT 276  --   --  243  HEPARINUNFRC  --   --   --  <0.10*  CREATININE 0.78  --   --   --   TROPONINIHS 6 7 7   --     Estimated Creatinine Clearance: 118 mL/min (by C-G formula based on SCr of 0.78 mg/dL).   Assessment: 80 YOM who arrives via EMS from cardiologist office with chest pain and worsening SOB. Pharmacy consulted to start IV heparin for ACS.   Heparin level undetectable on gtt at 1150 units/hr. No issues with line or bleeding reported per RN.  Goal of Therapy:  Heparin level 0.3-0.7 units/ml Monitor platelets by anticoagulation protocol: Yes   Plan:  Rebolus heparin 2500 units Increase heparin infusion to 1500 units/hr  F/u 6 hr HL  , PharmD, BCPS Please see amion for complete clinical pharmacist phone list 12/11/2020 4:08 AM

## 2020-12-11 NOTE — Progress Notes (Addendum)
ANTICOAGULATION CONSULT NOTE   Pharmacy Consult for heparin Indication: chest pain/ACS  Allergies  Allergen Reactions  . Lovastatin     diarrhea    Patient Measurements: Height: 5\' 8"  (172.7 cm) Weight: 104.5 kg (230 lb 5.3 oz) IBW/kg (Calculated) : 68.4 Heparin Dosing Weight: 90.4 kg   Vital Signs: Temp: 98 F (36.7 C) (12/24 1700) Temp Source: Oral (12/24 1700) BP: 133/114 (12/24 1700) Pulse Rate: 72 (12/24 1700)  Labs: Recent Labs    12/10/20 1732 12/10/20 1933 12/10/20 2306 12/11/20 0253 12/11/20 0929 12/11/20 1818  HGB 14.0  --   --  13.0  --   --   HCT 41.5  --   --  36.9*  --   --   PLT 276  --   --  243  --   --   HEPARINUNFRC  --   --   --  <0.10* <0.10* 0.13*  CREATININE 0.78  --   --  0.88  --   --   TROPONINIHS 6 7 7 7   --   --     Estimated Creatinine Clearance: 107.2 mL/min (by C-G formula based on SCr of 0.88 mg/dL).   Assessment: 63 YOM who arrives via EMS from cardiologist office with chest pain and worsening SOB. Pharmacy consulted to start IV heparin for ACS. Heparin currently running at 1850 units/hr. Spoke with RN who reported no issues with the IV line or infusion and no signs/symptoms of bleeding from patient.  CBC: Hgb 13.0; Plt 243 Heparin level: 0.13 (subtherapeutic)  Goal of Therapy:  Heparin level 0.3-0.7 units/ml Monitor platelets by anticoagulation protocol: Yes   Plan:  Increase heparin infusion to 2100 units/hr  6hr heparin level at 0200 Monitor s/sx bleeding, CBC, and heparin levels  , PharmD PGY1 Pharmacy Resident 12/11/2020 7:20 PM

## 2020-12-11 NOTE — Progress Notes (Signed)
Inpatient Diabetes Program Recommendations  AACE/ADA: New Consensus Statement on Inpatient Glycemic Control (2015)  Target Ranges:  Prepandial:   less than 140 mg/dL      Peak postprandial:   less than 180 mg/dL (1-2 hours)      Critically ill patients:  140 - 180 mg/dL   Lab Results  Component Value Date   GLUCAP 326 (H) 12/10/2020   HGBA1C 13.4 (H) 12/10/2020    Review of Glycemic Control  Diabetes history: DM2 Outpatient Diabetes medications: Lantus 60 units bid + Novolog 3-10 units tid ac meals correction scale Current orders for Inpatient glycemic control: Lantus 60 units bid + Novolog 0-15 units correction tid  Inpatient Diabetes Program Recommendations:   Spoke with patient via phone. DM coordinator spoke with patient on admission 10/31/19 with A1c of 12.7. Patient states he has decreased his sugary drinks and carbohydrates but fasting CBG is running >300 each morning. Patient is scheduled to have an insulin pump but has not received. Also mentioned 3-4 years ago he took Metformin and CBGs improved. Patient does not know why Metformin was discontinued. Patient states he rotates his injection sites also and follows up with Dr. Orvan Falconer. Ordered Living Well With Diabetes for patient to review.  -Add Novolog 10 units tid meal coverage if eats 50% -Consider Metformin to be added @ discharge  Thank you, Samuel Bailey. Samuel Alfred, RN, MSN, CDE  Diabetes Coordinator Inpatient Glycemic Control Team Team Pager [phone] (8am-5pm) 12/11/2020 7:36 AM

## 2020-12-11 NOTE — Progress Notes (Signed)
ANTICOAGULATION CONSULT NOTE   Pharmacy Consult for heparin Indication: chest pain/ACS  Allergies  Allergen Reactions  . Lovastatin     diarrhea    Patient Measurements: Height: 5\' 8"  (172.7 cm) Weight: 104.5 kg (230 lb 5.3 oz) IBW/kg (Calculated) : 68.4 Heparin Dosing Weight: 90.4 kg   Vital Signs: Temp: 98.2 F (36.8 C) (12/24 0735) Temp Source: Oral (12/24 0735) BP: 111/76 (12/24 0735) Pulse Rate: 84 (12/24 0735)  Labs: Recent Labs    12/10/20 1732 12/10/20 1933 12/10/20 2306 12/11/20 0253 12/11/20 0929  HGB 14.0  --   --  13.0  --   HCT 41.5  --   --  36.9*  --   PLT 276  --   --  243  --   HEPARINUNFRC  --   --   --  <0.10* <0.10*  CREATININE 0.78  --   --  0.88  --   TROPONINIHS 6 7 7 7   --     Estimated Creatinine Clearance: 107.2 mL/min (by C-G formula based on SCr of 0.88 mg/dL).   Assessment: 89 YOM who arrives via EMS from cardiologist office with chest pain and worsening SOB. Pharmacy consulted to start IV heparin for ACS.   Heparin level remains undetectable on gtt at 1500 units/hr. No issues with line or bleeding reported per RN.  Goal of Therapy:  Heparin level 0.3-0.7 units/ml Monitor platelets by anticoagulation protocol: Yes   Plan:  Rebolus heparin 3000 units Increase heparin infusion to 1850 units/hr  F/u 6 hr HL  , PharmD Clinical Pharmacist **Pharmacist phone directory can now be found on [email or web address] ([email or web address]).  Listed under [facility].

## 2020-12-11 NOTE — Progress Notes (Signed)
  Echocardiogram 2D Echocardiogram has been performed.  Augustine Radar 12/11/2020, 10:31 AM

## 2020-12-12 DIAGNOSIS — I2510 Atherosclerotic heart disease of native coronary artery without angina pectoris: Secondary | ICD-10-CM | POA: Diagnosis not present

## 2020-12-12 DIAGNOSIS — I200 Unstable angina: Secondary | ICD-10-CM | POA: Diagnosis not present

## 2020-12-12 DIAGNOSIS — J189 Pneumonia, unspecified organism: Secondary | ICD-10-CM | POA: Diagnosis not present

## 2020-12-12 LAB — CBC
HCT: 37.9 % — ABNORMAL LOW (ref 39.0–52.0)
Hemoglobin: 12.7 g/dL — ABNORMAL LOW (ref 13.0–17.0)
MCH: 28.8 pg (ref 26.0–34.0)
MCHC: 33.5 g/dL (ref 30.0–36.0)
MCV: 85.9 fL (ref 80.0–100.0)
Platelets: 234 10*3/uL (ref 150–400)
RBC: 4.41 MIL/uL (ref 4.22–5.81)
RDW: 13.1 % (ref 11.5–15.5)
WBC: 6 10*3/uL (ref 4.0–10.5)
nRBC: 0 % (ref 0.0–0.2)

## 2020-12-12 LAB — GLUCOSE, CAPILLARY
Glucose-Capillary: 328 mg/dL — ABNORMAL HIGH (ref 70–99)
Glucose-Capillary: 185 mg/dL — ABNORMAL HIGH (ref 70–99)
Glucose-Capillary: 211 mg/dL — ABNORMAL HIGH (ref 70–99)
Glucose-Capillary: 167 mg/dL — ABNORMAL HIGH (ref 70–99)

## 2020-12-12 LAB — HEPARIN LEVEL (UNFRACTIONATED)
Heparin Unfractionated: 0.19 IU/mL — ABNORMAL LOW (ref 0.30–0.70)
Heparin Unfractionated: 0.34 IU/mL (ref 0.30–0.70)

## 2020-12-12 LAB — PROCALCITONIN: Procalcitonin: <0.1 ng/mL

## 2020-12-12 MED ORDER — INSULIN GLARGINE 100 UNIT/ML ~~LOC~~ SOLN
70.0000 [IU] | Freq: Two times a day (BID) | SUBCUTANEOUS | Status: DC
  Administered 2020-12-12 – 2020-12-14 (×5): 70 [IU] via SUBCUTANEOUS
  Filled 2020-12-12 (×6): qty 0.7

## 2020-12-12 MED ORDER — CARVEDILOL 12.5 MG PO TABS
12.5000 mg | ORAL_TABLET | Freq: Two times a day (BID) | ORAL | Status: DC
  Administered 2020-12-12 – 2020-12-13 (×3): 12.5 mg via ORAL
  Filled 2020-12-12: qty 1
  Filled 2020-12-12: qty 2
  Filled 2020-12-12: qty 1

## 2020-12-12 MED ORDER — METOPROLOL TARTRATE 50 MG PO TABS: 50.0000 mg | ORAL_TABLET | Freq: Two times a day (BID) | ORAL | Status: DC

## 2020-12-12 NOTE — Progress Notes (Signed)
ANTICOAGULATION CONSULT NOTE   Pharmacy Consult for heparin Indication: chest pain/ACS  Allergies  Allergen Reactions  . Lovastatin     diarrhea    Patient Measurements: Height: 5\' 8"  (172.7 cm) Weight: 104.2 kg (229 lb 10.9 oz) IBW/kg (Calculated) : 68.4 Heparin Dosing Weight: 90.4 kg   Vital Signs: Temp: 97.9 F (36.6 C) (12/25 0522) Temp Source: Oral (12/25 0522) BP: 131/79 (12/25 0810) Pulse Rate: 98 (12/25 0810)  Labs: Recent Labs    12/10/20 1732 12/10/20 1933 12/10/20 2306 12/11/20 0253 12/11/20 0929 12/11/20 1818 12/12/20 0144 12/12/20 0849  HGB 14.0  --   --  13.0  --   --  12.7*  --   HCT 41.5  --   --  36.9*  --   --  37.9*  --   PLT 276  --   --  243  --   --  234  --   HEPARINUNFRC  --   --   --  <0.10*   < > 0.13* 0.19* 0.34  CREATININE 0.78  --   --  0.88  --   --   --   --   TROPONINIHS 6 7 7 7   --   --   --   --    < > = values in this interval not displayed.    Estimated Creatinine Clearance: 107 mL/min (by C-G formula based on SCr of 0.88 mg/dL).   Assessment: 89 YOM who arrives via EMS from cardiologist office with chest pain and worsening SOB. Pharmacy consulted to start IV heparin for ACS.  Heparin level now therapeutic (0.34) on gtt at 2400 units/hr after rate increase. CBC stable. LHC planned for Monday.   Goal of Therapy:  Heparin level 0.3-0.7 units/ml Monitor platelets by anticoagulation protocol: Yes   Plan:  Continue heparin at 2400 units/hr  Daily heparin level, CBC while on heparin   41, PharmD PGY1 Pharmacy Resident 12/12/2020 9:42 AM  Please check [email or web address] for unit-specific pharmacy phone numbers.

## 2020-12-12 NOTE — Progress Notes (Signed)
ANTICOAGULATION CONSULT NOTE   Pharmacy Consult for heparin Indication: chest pain/ACS  Allergies  Allergen Reactions  . Lovastatin     diarrhea    Patient Measurements: Height: 5\' 8"  (172.7 cm) Weight: 104.5 kg (230 lb 5.3 oz) IBW/kg (Calculated) : 68.4 Heparin Dosing Weight: 90.4 kg   Vital Signs: Temp: 97.9 F (36.6 C) (12/24 1959) Temp Source: Oral (12/24 1959) BP: 105/61 (12/24 1959) Pulse Rate: 94 (12/24 1959)  Labs: Recent Labs    12/10/20 1732 12/10/20 1732 12/10/20 1933 12/10/20 2306 12/11/20 0253 12/11/20 0929 12/11/20 1818 12/12/20 0144  HGB 14.0  --   --   --  13.0  --   --  12.7*  HCT 41.5  --   --   --  36.9*  --   --  37.9*  PLT 276  --   --   --  243  --   --  234  HEPARINUNFRC  --    < >  --   --  <0.10* <0.10* 0.13* 0.19*  CREATININE 0.78  --   --   --  0.88  --   --   --   TROPONINIHS 6  --  7 7 7   --   --   --    < > = values in this interval not displayed.    Estimated Creatinine Clearance: 107.2 mL/min (by C-G formula based on SCr of 0.88 mg/dL).   Assessment: 7 YOM who arrives via EMS from cardiologist office with chest pain and worsening SOB. Pharmacy consulted to start IV heparin for ACS. Heparin currently running at 1850 units/hr. Spoke with RN who reported no issues with the IV line or infusion and no signs/symptoms of bleeding from patient.  Heparin level remains subtherapeutic (0.19) on gtt at 2100 units/hr. No issues with line or bleeding reported per RN.  Goal of Therapy:  Heparin level 0.3-0.7 units/ml Monitor platelets by anticoagulation protocol: Yes   Plan:  Increase heparin infusion to 2400 units/hr  6hr heparin level  , PharmD, BCPS Please see [email or web address] for complete clinical pharmacist phone list 12/12/2020 2:57 AM

## 2020-12-12 NOTE — Progress Notes (Signed)
PROGRESS NOTE    [name]  ZOX:[id number] DOB: [birth year] DOA: 12/10/2020 PCP: Wilmer Floor., MD   Brief Narrative:  Samuel Bailey is an 59 y.o. male with PMH of uncontrolled DM2, CAD, COPD (Gold 0 - 1), GERD, h/o CVA, h/o peptic ulcer disease, HTN, HLD who presented on 12/10/20 for progressive chest pain concerning for unstable angina.  He was admitted to the Cardiology for further evaluation with plan for [facility] on Monday 12/14/20.  During initial evaluation, CXR was done which was concerning for multifocal pneumonia.  Also A1C returned at 13.4 and thus [facility] was contacted for consultation.   Mr. [name] reports that he has been SOB for about 1 month.  The SOB comes on at any time, rest and exertion.  He notes that it is slowly getting worse and associated with central chest pain which is dull in nature.  He notes no cold symptoms, no rhinorrhea or sinus pressure.  He has not had any fever or chills.  He does note a cough for the same time period which produces only whitish phlegm and congested feeling in his lungs.  He has had exposure to his grandson who has had a cold.  Further symptoms include dizziness and lightheadedness upon standing for 1 month.  He is being followed by his Cardiologist Dr. Servando Salina for volume overload and was on lasix for 5 days.  However, he notes that this "dried him out."  He lives in a mobile home that was recently renovated.  Air conditioning units are newer and are window units.  No one else living in the home is ill.  He has not had his COVID vaccine.  He did have a negative COVID and influenza PCR on 12/23.  He does have a history of COPD, but is only on reflumilast.  I reviewed PFTs from 2016 which showed only mild obstructive pattern.    As concerns his diabetes, he notes that he has been taking his insulin as prescribed, however, his blood sugars continue to run in the 300s at home.  He is in discussion with his PCP about getting an insulin pump to  help better control his blood sugars.   Assessment & Plan:   Active Problems:   Uncontrolled type 2 diabetes with neuropathy (HCC)   HTN (hypertension)   GERD (gastroesophageal reflux disease)   Hyperlipidemia   COPD with chronic bronchitis (HCC)   Mild CAD   Unstable angina (HCC)   Multifocal pneumonia   Mild CAD Unstable angina .  Plan for [facility] Monday. - Per primary team  History of COPD with chronic bronchitis now with multifocal pneumonia:  COPD with chronic bronchitis - Seen on xray as noted above. COVID and influenza negative.  Sick contact in grandson.  DDx includes CAP from a bacterial source vs. Viral pneumonia.  Urine antigen for streptococci is pending.  Still complains of shortness of breath.  Will check RVP.  Continue current antibiotics.  Despite of shortness of breath, he is not dyspneic, tachypneic or hypoxic.  Uncontrolled type 2 diabetes with neuropathy - A1C is 13.4.  He notes prolonged history of uncontrolled DM. It appears he was referred to Endocrinology at [facility] - Current dose of insulin is Lantus 60 units BID, with moderate sliding scale.  He is still hyperglycemic.  Will increase Lantus to 70 units twice daily and continue SSI.    HTN (hypertension) - Per primary team    GERD (gastroesophageal reflux disease) - Continue  PPI    Hyperlipidemia: On a statin. - Per primary team    DVT prophylaxis:    Code Status: Full Code  Family Communication:  None present at bedside.  Plan of care discussed with patient in length and he verbalized understanding and agreed with it.  Status is: Inpatient  Remains inpatient appropriate because:Ongoing diagnostic testing needed not appropriate for outpatient work up   Dispo: The patient is from: Home              Anticipated d/c is to: Per primary team              Anticipated d/c date is: Per primary team              Patient currently is not medically stable to d/c.        Estimated body  mass index is 34.92 kg/m as calculated from the following:   Height as of this encounter: 5\' 8"  (1.727 m).   Weight as of this encounter: 104.2 kg.      Nutritional status:               Consultants:   TRH  Procedures:   None  Antimicrobials:  Anti-infectives (From admission, onward)   Start     Dose/Rate Route Frequency Ordered Stop   12/12/20 1000  azithromycin (ZITHROMAX) tablet 250 mg       "Followed by" Linked Group Details   250 mg Oral Daily 12/11/20 1241 12/16/20 0959   12/11/20 1330  amoxicillin-clavulanate (AUGMENTIN) 875-125 MG per tablet 1 tablet        1 tablet Oral 2 times daily 12/11/20 1241 12/16/20 0959   12/11/20 1330  azithromycin (ZITHROMAX) tablet 500 mg       "Followed by" Linked Group Details   500 mg Oral Daily 12/11/20 1241 12/11/20 1524   12/10/20 1930  cefTRIAXone (ROCEPHIN) 1 g in sodium chloride 0.9 % 100 mL IVPB        1 g 200 mL/hr over 30 Minutes Intravenous  Once 12/10/20 1918 12/10/20 2120   12/10/20 1930  azithromycin (ZITHROMAX) 500 mg in sodium chloride 0.9 % 250 mL IVPB        500 mg 250 mL/hr over 60 Minutes Intravenous  Once 12/10/20 1918 12/10/20 2136         Subjective: Patient seen and examined.  He was complaining of shortness of breath even at rest but more so with activity.  Some cough.  No other complaint.  Objective: Vitals:   12/11/20 1959 12/12/20 0522 12/12/20 0810 12/12/20 0908  BP: 105/61 121/80 131/79   Pulse: 94 91 98   Resp: 18 16    Temp: 97.9 F (36.6 C) 97.9 F (36.6 C)    TempSrc: Oral Oral    SpO2: 97% 98%  98%  Weight:  104.2 kg    Height:        Intake/Output Summary (Last 24 hours) at 12/12/2020 1135 Last data filed at 12/12/2020 12/14/2020 Gross per 24 hour  Intake 1203.18 ml  Output 300 ml  Net 903.18 ml   Filed Weights   12/10/20 2227 12/11/20 0511 12/12/20 0522  Weight: 104.6 kg 104.5 kg 104.2 kg    Examination:  General exam: Appears calm and comfortable  Respiratory  system: Rhonchi bilaterally. Respiratory effort normal. Cardiovascular system: S1 & S2 heard, RRR. No JVD, murmurs, rubs, gallops or clicks. No pedal edema. Gastrointestinal system: Abdomen is nondistended, soft and nontender. No organomegaly or masses felt.  Normal bowel sounds heard. Central nervous system: Alert and oriented. No focal neurological deficits. Extremities: Symmetric 5 x 5 power. Skin: No rashes, lesions or ulcers Psychiatry: Judgement and insight appear normal. Mood & affect appropriate.    Data Reviewed: I have personally reviewed following labs and imaging studies  CBC: Recent Labs  Lab 12/10/20 1732 12/11/20 0253 12/12/20 0144  WBC 8.5 6.5 6.0  NEUTROABS 4.4  --   --   HGB 14.0 13.0 12.7*  HCT 41.5 36.9* 37.9*  MCV 85.6 84.1 85.9  PLT 276 243 234   Basic Metabolic Panel: Recent Labs  Lab 12/10/20 1732 12/11/20 0253  NA 133* 135  K 3.7 3.7  CL 100 99  CO2 22 24  GLUCOSE 338* 333*  BUN 7 8  CREATININE 0.78 0.88  CALCIUM 9.2 8.8*   GFR: Estimated Creatinine Clearance: 107 mL/min (by C-G formula based on SCr of 0.88 mg/dL). Liver Function Tests: Recent Labs  Lab 12/11/20 0253  AST 10*  ALT 11  ALKPHOS 57  BILITOT 0.6  PROT 5.6*  ALBUMIN 2.3*   No results for input(s): LIPASE, AMYLASE in the last 168 hours. No results for input(s): AMMONIA in the last 168 hours. Coagulation Profile: No results for input(s): INR, PROTIME in the last 168 hours. Cardiac Enzymes: No results for input(s): CKTOTAL, CKMB, CKMBINDEX, TROPONINI in the last 168 hours. BNP (last 3 results) No results for input(s): PROBNP in the last 8760 hours. HbA1C: Recent Labs    12/10/20 2306  HGBA1C 13.4*   CBG: Recent Labs  Lab 12/10/20 2253 12/11/20 0844 12/11/20 1131 12/11/20 2222 12/12/20 0731  GLUCAP 326* 261* 214* 183* 328*   Lipid Profile: No results for input(s): CHOL, HDL, LDLCALC, TRIG, CHOLHDL, LDLDIRECT in the last 72 hours. Thyroid Function  Tests: Recent Labs    12/10/20 2306  TSH 2.505   Anemia Panel: No results for input(s): VITAMINB12, FOLATE, FERRITIN, TIBC, IRON, RETICCTPCT in the last 72 hours. Sepsis Labs: No results for input(s): PROCALCITON, LATICACIDVEN in the last 168 hours.  Recent Results (from the past 240 hour(s))  Resp Panel by RT-PCR (Flu A&B, Covid) Nasopharyngeal Swab     Status: None   Collection Time: 12/10/20  5:32 PM   Specimen: Nasopharyngeal Swab; Nasopharyngeal(NP) swabs in vial transport medium  Result Value Ref Range Status   SARS Coronavirus 2 by RT PCR NEGATIVE NEGATIVE Final    Comment: (NOTE) SARS-CoV-2 target nucleic acids are NOT DETECTED.  The SARS-CoV-2 RNA is generally detectable in upper respiratory specimens during the acute phase of infection. The lowest concentration of SARS-CoV-2 viral copies this assay can detect is 138 copies/mL. A negative result does not preclude SARS-Cov-2 infection and should not be used as the sole basis for treatment or other patient management decisions. A negative result may occur with  improper specimen collection/handling, submission of specimen other than nasopharyngeal swab, presence of viral mutation(s) within the areas targeted by this assay, and inadequate number of viral copies(<138 copies/mL). A negative result must be combined with clinical observations, patient history, and epidemiological information. The expected result is Negative.  Fact Sheet for Patients:  BloggerCourse.com  Fact Sheet for Healthcare Providers:  SeriousBroker.it  This test is no t yet approved or cleared by the [place] FDA and  has been authorized for detection and/or diagnosis of SARS-CoV-2 by FDA under an Emergency Use Authorization (EUA). This EUA will remain  in effect (meaning this test can be used) for the duration of the COVID-19 declaration  under Section 564(b)(1) of the Act, 21 U.S.C.section  360bbb-3(b)(1), unless the authorization is terminated  or revoked sooner.       Influenza A by PCR NEGATIVE NEGATIVE Final   Influenza B by PCR NEGATIVE NEGATIVE Final    Comment: (NOTE) The Xpert Xpress SARS-CoV-2/FLU/RSV plus assay is intended as an aid in the diagnosis of influenza from Nasopharyngeal swab specimens and should not be used as a sole basis for treatment. Nasal washings and aspirates are unacceptable for Xpert Xpress SARS-CoV-2/FLU/RSV testing.  Fact Sheet for Patients: BloggerCourse.com  Fact Sheet for Healthcare Providers: SeriousBroker.it  This test is not yet approved or cleared by the [place] FDA and has been authorized for detection and/or diagnosis of SARS-CoV-2 by FDA under an Emergency Use Authorization (EUA). This EUA will remain in effect (meaning this test can be used) for the duration of the COVID-19 declaration under Section 564(b)(1) of the Act, 21 U.S.C. section 360bbb-3(b)(1), unless the authorization is terminated or revoked.  Performed at [facility], [address] [place], [place] [address]       Radiology Studies: DG Chest Portable 1 View  Result Date: 12/10/2020 CLINICAL DATA:  Chest pain. EXAM: PORTABLE CHEST 1 VIEW COMPARISON:  Chest x-ray 10/29/2019 FINDINGS: The heart size and mediastinal contours are within normal limits. Interval development of peripheral patchy airspace opacities. No pulmonary edema. No pleural effusion. No pneumothorax. No acute osseous abnormality. Redemonstration of retained shrapnel noted overlying the left shoulder. Left humeral fixation with screws. IMPRESSION: Multifocal pneumonia. COVID-19 infection not excluded. Followup PA and lateral chest X-ray is recommended in 3-4 weeks following therapy to ensure resolution and exclude underlying malignancy. Electronically Signed   By: Tish Frederickson M.D.   On: 12/10/2020 17:37   ECHOCARDIOGRAM  COMPLETE  Result Date: 12/11/2020    ECHOCARDIOGRAM REPORT   Patient Name:   Samuel Bailey Date of Exam: 12/11/2020 Medical Rec #:  [id number]     Height:       68.0 in Accession #:    [id number]    Weight:       230.3 lb Date of Birth:  [birth year]     BSA:          2.170 m Patient Age:    59 years      BP:           126/84 mmHg Patient Gender: M             HR:           77 bpm. Exam Location:  Inpatient Procedure: 2D Echo, Color Doppler and Cardiac Doppler Indications:    Dyspnea R06.00                 Chest Pain R07.9  History:        Patient has prior history of Echocardiogram examinations, most                 recent 10/31/2019. CAD, COPD, Signs/Symptoms:Syncope; Risk                 Factors:Diabetes, Current Smoker, Hypertension and Dyslipidemia.  Sonographer:    Eulah Pont RDCS Referring Phys: [id number] RHONDA G BARRETT IMPRESSIONS  1. Left ventricular ejection fraction, by estimation, is 50 to 55%. The left ventricle has low normal function. The left ventricle has no regional wall motion abnormalities. Left ventricular diastolic parameters were normal.  2. Right ventricular systolic function is normal. The right ventricular size is normal.  3.  The mitral valve is normal in structure. No evidence of mitral valve regurgitation. No evidence of mitral stenosis.  4. The aortic valve is tricuspid. Aortic valve regurgitation is not visualized. No aortic stenosis is present.  5. The inferior vena cava is normal in size with greater than 50% respiratory variability, suggesting right atrial pressure of 3 mmHg. FINDINGS  Left Ventricle: Left ventricular ejection fraction, by estimation, is 50 to 55%. The left ventricle has low normal function. The left ventricle has no regional wall motion abnormalities. The left ventricular internal cavity size was normal in size. There is no left ventricular hypertrophy. Left ventricular diastolic parameters were normal. Right Ventricle: The right ventricular size is normal. No  increase in right ventricular wall thickness. Right ventricular systolic function is normal. Left Atrium: Left atrial size was normal in size. Right Atrium: Right atrial size was normal in size. Pericardium: There is no evidence of pericardial effusion. Mitral Valve: The mitral valve is normal in structure. No evidence of mitral valve regurgitation. No evidence of mitral valve stenosis. Tricuspid Valve: The tricuspid valve is normal in structure. Tricuspid valve regurgitation is not demonstrated. No evidence of tricuspid stenosis. Aortic Valve: The aortic valve is tricuspid. Aortic valve regurgitation is not visualized. No aortic stenosis is present. Aortic valve mean gradient measures 2.6 mmHg. Aortic valve peak gradient measures 5.4 mmHg. Aortic valve area, by VTI measures 3.66 cm. Pulmonic Valve: The pulmonic valve was not well visualized. Pulmonic valve regurgitation is not visualized. No evidence of pulmonic stenosis. Aorta: The aortic root is normal in size and structure. Pulmonary Artery: Indeterminant PASP, inadequate TR jet. Venous: The inferior vena cava is normal in size with greater than 50% respiratory variability, suggesting right atrial pressure of 3 mmHg. IAS/Shunts: No atrial level shunt detected by color flow Doppler.  LEFT VENTRICLE PLAX 2D LVIDd:         5.20 cm      Diastology LVIDs:         4.00 cm      LV e' medial:    8.08 cm/s LV PW:         0.70 cm      LV E/e' medial:  9.4 LV IVS:        0.80 cm      LV e' lateral:   10.30 cm/s LVOT diam:     2.30 cm      LV E/e' lateral: 7.3 LV SV:         76 LV SV Index:   35 LVOT Area:     4.15 cm  LV Volumes (MOD) LV vol d, MOD A2C: 96.7 ml LV vol d, MOD A4C: 109.0 ml LV vol s, MOD A2C: 44.3 ml LV vol s, MOD A4C: 68.0 ml LV SV MOD A2C:     52.4 ml LV SV MOD A4C:     109.0 ml LV SV MOD BP:      49.8 ml RIGHT VENTRICLE RV S prime:     14.70 cm/s TAPSE (M-mode): 2.4 cm LEFT ATRIUM             Index       RIGHT ATRIUM           Index LA diam:        3.70  cm 1.70 cm/m  RA Area:     13.10 cm LA Vol (A2C):   53.8 ml 24.79 ml/m RA Volume:   26.90 ml  12.40 ml/m LA Vol (A4C):  53.6 ml 24.70 ml/m LA Biplane Vol: 54.6 ml 25.16 ml/m  AORTIC VALVE AV Area (Vmax):    3.79 cm AV Area (Vmean):   4.09 cm AV Area (VTI):     3.66 cm AV Vmax:           116.41 cm/s AV Vmean:          74.045 cm/s AV VTI:            0.209 m AV Peak Grad:      5.4 mmHg AV Mean Grad:      2.6 mmHg LVOT Vmax:         106.06 cm/s LVOT Vmean:        72.900 cm/s LVOT VTI:          0.183 m LVOT/AV VTI ratio: 0.88  AORTA Ao Root diam: 3.50 cm Ao Asc diam:  3.20 cm MITRAL VALVE MV Area (PHT): 3.42 cm    SHUNTS MV Decel Time: 222 msec    Systemic VTI:  0.18 m MV E velocity: 75.60 cm/s  Systemic Diam: 2.30 cm MV A velocity: 75.60 cm/s MV E/A ratio:  1.00 Dina Rich MD Electronically signed by Dina Rich MD Signature Date/Time: 12/11/2020/11:02:16 AM    Final     Scheduled Meds: . amoxicillin-clavulanate  1 tablet Oral BID  . aspirin EC  81 mg Oral Daily  . atorvastatin  40 mg Oral q1800  . azithromycin  250 mg Oral Daily  . carvedilol  12.5 mg Oral BID WC  . ezetimibe  10 mg Oral Daily  . fenofibrate  54 mg Oral Daily  . gabapentin  300 mg Oral TID  . insulin aspart  0-20 Units Subcutaneous TID WC  . insulin aspart  0-5 Units Subcutaneous QHS  . insulin aspart  4 Units Subcutaneous TID WC  . insulin glargine  70 Units Subcutaneous BID  . isosorbide mononitrate  15 mg Oral Daily  . pantoprazole  40 mg Oral BID AC  . ranolazine  500 mg Oral BID  . roflumilast  500 mcg Oral Daily  . umeclidinium bromide  1 puff Inhalation Daily   Continuous Infusions: . heparin 2,400 Units/hr (12/12/20 0934)     LOS: 2 days   Time spent: 33 minutes   Hughie Closs, MD [facility]  12/12/2020, 11:35 AM   To contact the attending provider between 7A-7P or the covering provider during after hours 7P-7A, please log into the web site [email or web address].

## 2020-12-12 NOTE — Progress Notes (Addendum)
Progress Note  Patient Name: Samuel Bailey Date of Encounter: 12/12/2020  [facility] Cardiologist: Lavona Mound Tobb, DO   Subjective   No acute overnight events. His main complaint this morning is shortness of breath. Started on antibiotics yesterday for multifocal pneumonia. He does report occasional chest pain with ambulating and when he is more shortness of breath. Describes having a chest heaviness with his shortness of breath. He denies any palpitations.  Inpatient Medications    Scheduled Meds: . amoxicillin-clavulanate  1 tablet Oral BID  . aspirin EC  81 mg Oral Daily  . atorvastatin  40 mg Oral q1800  . azithromycin  250 mg Oral Daily  . carvedilol  6.25 mg Oral BID WC  . ezetimibe  10 mg Oral Daily  . fenofibrate  54 mg Oral Daily  . gabapentin  300 mg Oral TID  . insulin aspart  0-20 Units Subcutaneous TID WC  . insulin aspart  0-5 Units Subcutaneous QHS  . insulin aspart  4 Units Subcutaneous TID WC  . insulin glargine  70 Units Subcutaneous BID  . isosorbide mononitrate  15 mg Oral Daily  . pantoprazole  40 mg Oral BID AC  . ranolazine  500 mg Oral BID  . roflumilast  500 mcg Oral Daily  . umeclidinium bromide  1 puff Inhalation Daily   Continuous Infusions: . heparin 2,400 Units/hr (12/12/20 0300)   PRN Meds: acetaminophen, ALPRAZolam, nitroGLYCERIN, ondansetron (ZOFRAN) IV, zolpidem   Vital Signs    Vitals:   12/11/20 1959 12/12/20 0522 12/12/20 0810 12/12/20 0908  BP: 105/61 121/80 131/79   Pulse: 94 91 98   Resp: 18 16    Temp: 97.9 F (36.6 C) 97.9 F (36.6 C)    TempSrc: Oral Oral    SpO2: 97% 98%  98%  Weight:  104.2 kg    Height:        Intake/Output Summary (Last 24 hours) at 12/12/2020 0177 Last data filed at 12/12/2020 9390 Gross per 24 hour  Intake 1383.18 ml  Output 300 ml  Net 1083.18 ml   Last 3 Weights 12/12/2020 12/11/2020 12/10/2020  Weight (lbs) 229 lb 10.9 oz 230 lb 5.3 oz 230 lb 8 oz  Weight (kg) 104.182 kg 104.478 kg  104.554 kg      Telemetry    Sinus rhythm with rates in the 80's to 110's. - Personally Reviewed  ECG    No new ECG tracing today. - Personally Reviewed  Physical Exam   GEN: No acute distress.   Neck: No JVD. Cardiac: Tachycardic with regular rhythm. No murmurs, rubs, or gallops.  Respiratory: Mild increased work of breathing at times. Clear to auscultation bilaterally. No significant wheezes, rhonchi, or rales appreciated.  GI: Soft, non-tender, non-distended.  MS: No significant lower extremity edema. Lower extremities tender to palpation due to peripheral neuropathy. Skin: Warm and dry. Neuro:  No focal deficits. Psych: Normal affect. Responds appropriately.  Labs    High Sensitivity Troponin:   Recent Labs  Lab 12/10/20 1732 12/10/20 1933 12/10/20 2306 12/11/20 0253  TROPONINIHS 6 7 7 7       Chemistry Recent Labs  Lab 12/10/20 1732 12/11/20 0253  NA 133* 135  K 3.7 3.7  CL 100 99  CO2 22 24  GLUCOSE 338* 333*  BUN 7 8  CREATININE 0.78 0.88  CALCIUM 9.2 8.8*  PROT  --  5.6*  ALBUMIN  --  2.3*  AST  --  10*  ALT  --  11  ALKPHOS  --  57  BILITOT  --  0.6  GFRNONAA >60 >60  ANIONGAP 11 12     Hematology Recent Labs  Lab 12/10/20 1732 12/11/20 0253 12/12/20 0144  WBC 8.5 6.5 6.0  RBC 4.85 4.39 4.41  HGB 14.0 13.0 12.7*  HCT 41.5 36.9* 37.9*  MCV 85.6 84.1 85.9  MCH 28.9 29.6 28.8  MCHC 33.7 35.2 33.5  RDW 12.8 13.0 13.1  PLT 276 243 234    BNP Recent Labs  Lab 12/10/20 1732  BNP 26.7     DDimer No results for input(s): DDIMER in the last 168 hours.   Radiology    DG Chest Portable 1 View  Result Date: 12/10/2020 CLINICAL DATA:  Chest pain. EXAM: PORTABLE CHEST 1 VIEW COMPARISON:  Chest x-ray 10/29/2019 FINDINGS: The heart size and mediastinal contours are within normal limits. Interval development of peripheral patchy airspace opacities. No pulmonary edema. No pleural effusion. No pneumothorax. No acute osseous abnormality.  Redemonstration of retained shrapnel noted overlying the left shoulder. Left humeral fixation with screws. IMPRESSION: Multifocal pneumonia. COVID-19 infection not excluded. Followup PA and lateral chest X-ray is recommended in 3-4 weeks following therapy to ensure resolution and exclude underlying malignancy. Electronically Signed   By: Tish Frederickson M.D.   On: 12/10/2020 17:37   ECHOCARDIOGRAM COMPLETE  Result Date: 12/11/2020    ECHOCARDIOGRAM REPORT   Patient Name:   Samuel Bailey Date of Exam: 12/11/2020 Medical Rec #:  [id number]     Height:       68.0 in Accession #:    [id number]    Weight:       230.3 lb Date of Birth:  10-03-1961     BSA:          2.170 m Patient Age:    59 years      BP:           126/84 mmHg Patient Gender: M             HR:           77 bpm. Exam Location:  Inpatient Procedure: 2D Echo, Color Doppler and Cardiac Doppler Indications:    Dyspnea R06.00                 Chest Pain R07.9  History:        Patient has prior history of Echocardiogram examinations, most                 recent 10/31/2019. CAD, COPD, Signs/Symptoms:Syncope; Risk                 Factors:Diabetes, Current Smoker, Hypertension and Dyslipidemia.  Sonographer:    Eulah Pont RDCS Referring Phys: [id number] RHONDA G BARRETT IMPRESSIONS  1. Left ventricular ejection fraction, by estimation, is 50 to 55%. The left ventricle has low normal function. The left ventricle has no regional wall motion abnormalities. Left ventricular diastolic parameters were normal.  2. Right ventricular systolic function is normal. The right ventricular size is normal.  3. The mitral valve is normal in structure. No evidence of mitral valve regurgitation. No evidence of mitral stenosis.  4. The aortic valve is tricuspid. Aortic valve regurgitation is not visualized. No aortic stenosis is present.  5. The inferior vena cava is normal in size with greater than 50% respiratory variability, suggesting right atrial pressure of 3 mmHg. FINDINGS   Left Ventricle: Left ventricular ejection fraction, by estimation, is 50 to 55%. The left ventricle has  low normal function. The left ventricle has no regional wall motion abnormalities. The left ventricular internal cavity size was normal in size. There is no left ventricular hypertrophy. Left ventricular diastolic parameters were normal. Right Ventricle: The right ventricular size is normal. No increase in right ventricular wall thickness. Right ventricular systolic function is normal. Left Atrium: Left atrial size was normal in size. Right Atrium: Right atrial size was normal in size. Pericardium: There is no evidence of pericardial effusion. Mitral Valve: The mitral valve is normal in structure. No evidence of mitral valve regurgitation. No evidence of mitral valve stenosis. Tricuspid Valve: The tricuspid valve is normal in structure. Tricuspid valve regurgitation is not demonstrated. No evidence of tricuspid stenosis. Aortic Valve: The aortic valve is tricuspid. Aortic valve regurgitation is not visualized. No aortic stenosis is present. Aortic valve mean gradient measures 2.6 mmHg. Aortic valve peak gradient measures 5.4 mmHg. Aortic valve area, by VTI measures 3.66 cm. Pulmonic Valve: The pulmonic valve was not well visualized. Pulmonic valve regurgitation is not visualized. No evidence of pulmonic stenosis. Aorta: The aortic root is normal in size and structure. Pulmonary Artery: Indeterminant PASP, inadequate TR jet. Venous: The inferior vena cava is normal in size with greater than 50% respiratory variability, suggesting right atrial pressure of 3 mmHg. IAS/Shunts: No atrial level shunt detected by color flow Doppler.  LEFT VENTRICLE PLAX 2D LVIDd:         5.20 cm      Diastology LVIDs:         4.00 cm      LV e' medial:    8.08 cm/s LV PW:         0.70 cm      LV E/e' medial:  9.4 LV IVS:        0.80 cm      LV e' lateral:   10.30 cm/s LVOT diam:     2.30 cm      LV E/e' lateral: 7.3 LV SV:         76 LV  SV Index:   35 LVOT Area:     4.15 cm  LV Volumes (MOD) LV vol d, MOD A2C: 96.7 ml LV vol d, MOD A4C: 109.0 ml LV vol s, MOD A2C: 44.3 ml LV vol s, MOD A4C: 68.0 ml LV SV MOD A2C:     52.4 ml LV SV MOD A4C:     109.0 ml LV SV MOD BP:      49.8 ml RIGHT VENTRICLE RV S prime:     14.70 cm/s TAPSE (M-mode): 2.4 cm LEFT ATRIUM             Index       RIGHT ATRIUM           Index LA diam:        3.70 cm 1.70 cm/m  RA Area:     13.10 cm LA Vol (A2C):   53.8 ml 24.79 ml/m RA Volume:   26.90 ml  12.40 ml/m LA Vol (A4C):   53.6 ml 24.70 ml/m LA Biplane Vol: 54.6 ml 25.16 ml/m  AORTIC VALVE AV Area (Vmax):    3.79 cm AV Area (Vmean):   4.09 cm AV Area (VTI):     3.66 cm AV Vmax:           116.41 cm/s AV Vmean:          74.045 cm/s AV VTI:  0.209 m AV Peak Grad:      5.4 mmHg AV Mean Grad:      2.6 mmHg LVOT Vmax:         106.06 cm/s LVOT Vmean:        72.900 cm/s LVOT VTI:          0.183 m LVOT/AV VTI ratio: 0.88  AORTA Ao Root diam: 3.50 cm Ao Asc diam:  3.20 cm MITRAL VALVE MV Area (PHT): 3.42 cm    SHUNTS MV Decel Time: 222 msec    Systemic VTI:  0.18 m MV E velocity: 75.60 cm/s  Systemic Diam: 2.30 cm MV A velocity: 75.60 cm/s MV E/A ratio:  1.00 Dina RichJonathan Branch MD Electronically signed by Dina RichJonathan Branch MD Signature Date/Time: 12/11/2020/11:02:16 AM    Final     Cardiac Studies   Echocardiogram 12/11/2020: Impressions: 1. Left ventricular ejection fraction, by estimation, is 50 to 55%. The  left ventricle has low normal function. The left ventricle has no regional  wall motion abnormalities. Left ventricular diastolic parameters were  normal.  2. Right ventricular systolic function is normal. The right ventricular  size is normal.  3. The mitral valve is normal in structure. No evidence of mitral valve  regurgitation. No evidence of mitral stenosis.  4. The aortic valve is tricuspid. Aortic valve regurgitation is not  visualized. No aortic stenosis is present.  5. The inferior  vena cava is normal in size with greater than 50%  respiratory variability, suggesting right atrial pressure of 3 mmHg.   Patient Profile     59 y.o. male with a history of non-obstructive CAD on cath in 10/2019, hypertension, hyperlipidemia, and uncontrolled type 2 diabetes mellitus who was sent to the ED from our our office on 12/10/2020 due to concerns for unstable angina with plans for cardiac catheterization.  Assessment & Plan    Chest Pain History of Non-Obstructive CAD - EKG shows non-specific ST/T changes. - High-sensitivity troponin negative x3. - Echo showed LVEF of 50-55% with normal wall motion and diastolic dysfunction. - He continues to report some chest pain/discomfort with activity or when he gets really shortness of breath. - Currently on IV Heparin.  - Continue aspirin, beta-blocker, and high-intensity statin. Continue Imdur 15mg  daily and Ranexa 500mg  twice daily. - Initial plan was for cardiac catheterization on Monday. LHC from 10/2019 just only mild non-obstructive disease. Given enzymes are negative, we may be able to due a coronary CTA if we can get his heart rates under better control. Rates currently in the 110's at rest. Will stop his Coreg and start Lopressor 50mg  twice daily.   Multifocal Pneumonia - Patient main complaint this morning is shortness of breath. - Chest x-ray on admission showed multifocal pneumonia.  - [facility] was consulted and has started patient on Augmentin and Azithromycin. - Management per [facility].  Hypertension - BP mostly well controlled. - Will stop Coreg and switch to Lopressor as stated above. - Continue Imdur.  Hyperlipidemia - LDL in 10/2019 was 54. - Continue Lipitor 40mg  daily, Zetia 10mg  daily, and fenofibrate.  - Will repeat fasting lipid panel in the morning.  Uncontrolled Type 2 Diabetes  - Hemoglobin A1c 13.4 this admission. - On Lantus and Novolog at home. - Management per [facility].   For questions or updates, please  contact [facility] Please consult [email or web address] for contact info under        Signed, Corrin ParkerCallie E Goodrich, PA-C  12/12/2020, 9:22 AM    Agree with note  by Marjie Skiff, PA-C  Patient admitted with chest pain and shortness of breath.  He had diagnostic coronary angiography by Dr. [place] 10/31/2019 revealing at most 40% distal dominant RCA stenosis and no evidence of CAD in the left system.  His EF was normal.  He has multifocal pneumonia on chest x-ray on antibiotics.  He is on IV heparin.  Has had no recurrent pain since hospitalization.  Enzymes are negative.  EKG shows no acute changes.  He does have some scattered crackles on exam.  Plan for left heart cath on Monday. The patient understands that risks included but are not limited to stroke (1 in 1000), death (1 in 1000), kidney failure [usually temporary] (1 in 500), bleeding (1 in 200), allergic reaction [possibly serious] (1 in 200). The patient understands and agrees to proceed   Runell Gess, M.D., FACP, [facility], [facility] [address] [place], [place]  [address]  [phone] 12/12/2020 9:55 AM

## 2020-12-13 DIAGNOSIS — I200 Unstable angina: Secondary | ICD-10-CM | POA: Diagnosis not present

## 2020-12-13 DIAGNOSIS — I2510 Atherosclerotic heart disease of native coronary artery without angina pectoris: Secondary | ICD-10-CM | POA: Diagnosis not present

## 2020-12-13 LAB — BASIC METABOLIC PANEL
Anion gap: 10 (ref 5–15)
BUN: 7 mg/dL (ref 6–20)
CO2: 23 mmol/L (ref 22–32)
Calcium: 8.4 mg/dL — ABNORMAL LOW (ref 8.9–10.3)
Chloride: 105 mmol/L (ref 98–111)
Creatinine, Ser: 0.86 mg/dL (ref 0.61–1.24)
GFR, Estimated: >60 mL/min (ref >60)
Glucose, Bld: 163 mg/dL — ABNORMAL HIGH (ref 70–99)
Potassium: 3.8 mmol/L (ref 3.5–5.1)
Sodium: 138 mmol/L (ref 135–145)

## 2020-12-13 LAB — RESPIRATORY PANEL BY PCR

## 2020-12-13 LAB — GLUCOSE, CAPILLARY
Glucose-Capillary: 144 mg/dL — ABNORMAL HIGH (ref 70–99)
Glucose-Capillary: 140 mg/dL — ABNORMAL HIGH (ref 70–99)
Glucose-Capillary: 195 mg/dL — ABNORMAL HIGH (ref 70–99)
Glucose-Capillary: 233 mg/dL — ABNORMAL HIGH (ref 70–99)

## 2020-12-13 LAB — CBC
HCT: 35.7 % — ABNORMAL LOW (ref 39.0–52.0)
Hemoglobin: 12.4 g/dL — ABNORMAL LOW (ref 13.0–17.0)
MCH: 29.9 pg (ref 26.0–34.0)
MCHC: 34.7 g/dL (ref 30.0–36.0)
MCV: 86 fL (ref 80.0–100.0)
Platelets: 220 10*3/uL (ref 150–400)
RBC: 4.15 MIL/uL — ABNORMAL LOW (ref 4.22–5.81)
RDW: 13.3 % (ref 11.5–15.5)
WBC: 6 10*3/uL (ref 4.0–10.5)
nRBC: 0 % (ref 0.0–0.2)

## 2020-12-13 LAB — LIPID PANEL
Cholesterol: 147 mg/dL (ref 0–200)
HDL: 32 mg/dL — ABNORMAL LOW
LDL Cholesterol: 41 mg/dL (ref 0–99)
Total CHOL/HDL Ratio: 4.6 ratio
Triglycerides: 370 mg/dL — ABNORMAL HIGH
VLDL: 74 mg/dL — ABNORMAL HIGH (ref 0–40)

## 2020-12-13 LAB — HEPARIN LEVEL (UNFRACTIONATED): Heparin Unfractionated: 0.45 [IU]/mL (ref 0.30–0.70)

## 2020-12-13 MED ORDER — SODIUM CHLORIDE 0.9 % WEIGHT BASED INFUSION
3.0000 mL/kg/h | INTRAVENOUS | Status: DC
  Administered 2020-12-14: 05:00:00 3 mL/kg/h via INTRAVENOUS

## 2020-12-13 MED ORDER — ANGIOPLASTY BOOK
Freq: Once | Status: AC
  Filled 2020-12-13: qty 1

## 2020-12-13 MED ORDER — SODIUM CHLORIDE 0.9% FLUSH: 3.0000 mL | INTRAVENOUS | Status: DC | PRN

## 2020-12-13 MED ORDER — SODIUM CHLORIDE 0.9 % WEIGHT BASED INFUSION
1.0000 mL/kg/h | INTRAVENOUS | Status: DC
  Administered 2020-12-14: 06:00:00 1 mL/kg/h via INTRAVENOUS

## 2020-12-13 MED ORDER — SODIUM CHLORIDE 0.9% FLUSH
3.0000 mL | Freq: Two times a day (BID) | INTRAVENOUS | Status: DC
  Administered 2020-12-13: 22:00:00 3 mL via INTRAVENOUS

## 2020-12-13 MED ORDER — SODIUM CHLORIDE 0.9 % IV SOLN: 250.0000 mL | INTRAVENOUS | Status: DC | PRN

## 2020-12-13 NOTE — H&P (View-Only) (Signed)
 Progress Note  Patient Name: Samuel Bailey Date of Encounter: 12/13/2020  [facility] Cardiologist: Kardie Tobb, DO   Subjective   No acute overnight events. Patient still has some shortness of breath but states he thinks it is improving. Also continues to have chest pain when ambulating as well as lightheadedness/dizziness. Plan is for left heart catheterization tomorrow.  Inpatient Medications    Scheduled Meds:  amoxicillin-clavulanate  1 tablet Oral BID   aspirin EC  81 mg Oral Daily   atorvastatin  40 mg Oral q1800   azithromycin  250 mg Oral Daily   carvedilol  12.5 mg Oral BID WC   ezetimibe  10 mg Oral Daily   fenofibrate  54 mg Oral Daily   gabapentin  300 mg Oral TID   insulin aspart  0-20 Units Subcutaneous TID WC   insulin aspart  0-5 Units Subcutaneous QHS   insulin aspart  4 Units Subcutaneous TID WC   insulin glargine  70 Units Subcutaneous BID   isosorbide mononitrate  15 mg Oral Daily   pantoprazole  40 mg Oral BID AC   ranolazine  500 mg Oral BID   roflumilast  500 mcg Oral Daily   umeclidinium bromide  1 puff Inhalation Daily   Continuous Infusions:  heparin 2,400 Units/hr (12/13/20 0712)   PRN Meds: acetaminophen, ALPRAZolam, nitroGLYCERIN, ondansetron (ZOFRAN) IV, zolpidem   Vital Signs    Vitals:   12/12/20 1728 12/12/20 1748 12/12/20 2010 12/13/20 0530  BP: 114/72 121/71  122/65  Pulse: (!) 102 99 91 86  Resp:  20 20   Temp:   98.5 F (36.9 C) 98.2 F (36.8 C)  TempSrc:   Oral Oral  SpO2:   97% 97%  Weight:      Height:        Intake/Output Summary (Last 24 hours) at 12/13/2020 0823 Last data filed at 12/12/2020 1808 Gross per 24 hour  Intake 949.6 ml  Output --  Net 949.6 ml   Last 3 Weights 12/12/2020 12/11/2020 12/10/2020  Weight (lbs) 229 lb 10.9 oz 230 lb 5.3 oz 230 lb 8 oz  Weight (kg) 104.182 kg 104.478 kg 104.554 kg      Telemetry    Normal sinus rhythm with rates in the 80's to 90's. - Personally  Reviewed  ECG    No new ECG tracing today. - Personally Reviewed  Physical Exam   GEN: No acute distress.   Neck: Supple. No JVD Cardiac: RRR. No murmurs, rubs, or gallops. Right radial pulse 2+. Respiratory: Initially had rhonchi but this cleared with coughing. Clear to auscultation bilaterally after coughing. GI: Soft, obese, and non-tender.  MS: No significant lower extremity edema. No deformity. Skin: Warm and dry. Neuro:  No focal deficits. Psych: Normal affect. Responds appropriately.   Labs    High Sensitivity Troponin:   Recent Labs  Lab 12/10/20 1732 12/10/20 1933 12/10/20 2306 12/11/20 0253  TROPONINIHS 6 7 7 7      Chemistry Recent Labs  Lab 12/10/20 1732 12/11/20 0253 12/13/20 0500  NA 133* 135 138  K 3.7 3.7 3.8  CL 100 99 105  CO2 22 24 23  GLUCOSE 338* 333* 163*  BUN 7 8 7  CREATININE 0.78 0.88 0.86  CALCIUM 9.2 8.8* 8.4*  PROT  --  5.6*  --   ALBUMIN  --  2.3*  --   AST  --  10*  --   ALT  --  11  --     ALKPHOS  --  57  --   BILITOT  --  0.6  --   GFRNONAA >60 >60 >60  ANIONGAP 11 12 10      Hematology Recent Labs  Lab 12/11/20 0253 12/12/20 0144 12/13/20 0500  WBC 6.5 6.0 6.0  RBC 4.39 4.41 4.15*  HGB 13.0 12.7* 12.4*  HCT 36.9* 37.9* 35.7*  MCV 84.1 85.9 86.0  MCH 29.6 28.8 29.9  MCHC 35.2 33.5 34.7  RDW 13.0 13.1 13.3  PLT 243 234 220    BNP Recent Labs  Lab 12/10/20 1732  BNP 26.7     DDimer No results for input(s): DDIMER in the last 168 hours.   Radiology    ECHOCARDIOGRAM COMPLETE  Result Date: 12/11/2020    ECHOCARDIOGRAM REPORT   Patient Name:   Samuel Bailey Date of Exam: 12/11/2020 Medical Rec #:  12/13/2020     Height:       68.0 in Accession #:    [id number]    Weight:       230.3 lb Date of Birth:  June 19, 1961     BSA:          2.170 m Patient Age:    59 years      BP:           126/84 mmHg Patient Gender: M             HR:           77 bpm. Exam Location:  Inpatient Procedure: 2D Echo, Color Doppler and  Cardiac Doppler Indications:    Dyspnea R06.00                 Chest Pain R07.9  History:        Patient has prior history of Echocardiogram examinations, most                 recent 10/31/2019. CAD, COPD, Signs/Symptoms:Syncope; Risk                 Factors:Diabetes, Current Smoker, Hypertension and Dyslipidemia.  Sonographer:    13/11/2019 RDCS Referring Phys: 50 RHONDA G BARRETT IMPRESSIONS  1. Left ventricular ejection fraction, by estimation, is 50 to 55%. The left ventricle has low normal function. The left ventricle has no regional wall motion abnormalities. Left ventricular diastolic parameters were normal.  2. Right ventricular systolic function is normal. The right ventricular size is normal.  3. The mitral valve is normal in structure. No evidence of mitral valve regurgitation. No evidence of mitral stenosis.  4. The aortic valve is tricuspid. Aortic valve regurgitation is not visualized. No aortic stenosis is present.  5. The inferior vena cava is normal in size with greater than 50% respiratory variability, suggesting right atrial pressure of 3 mmHg. FINDINGS  Left Ventricle: Left ventricular ejection fraction, by estimation, is 50 to 55%. The left ventricle has low normal function. The left ventricle has no regional wall motion abnormalities. The left ventricular internal cavity size was normal in size. There is no left ventricular hypertrophy. Left ventricular diastolic parameters were normal. Right Ventricle: The right ventricular size is normal. No increase in right ventricular wall thickness. Right ventricular systolic function is normal. Left Atrium: Left atrial size was normal in size. Right Atrium: Right atrial size was normal in size. Pericardium: There is no evidence of pericardial effusion. Mitral Valve: The mitral valve is normal in structure. No evidence of mitral valve regurgitation. No evidence of mitral valve stenosis. Tricuspid Valve:  The tricuspid valve is normal in structure.  Tricuspid valve regurgitation is not demonstrated. No evidence of tricuspid stenosis. Aortic Valve: The aortic valve is tricuspid. Aortic valve regurgitation is not visualized. No aortic stenosis is present. Aortic valve mean gradient measures 2.6 mmHg. Aortic valve peak gradient measures 5.4 mmHg. Aortic valve area, by VTI measures 3.66 cm. Pulmonic Valve: The pulmonic valve was not well visualized. Pulmonic valve regurgitation is not visualized. No evidence of pulmonic stenosis. Aorta: The aortic root is normal in size and structure. Pulmonary Artery: Indeterminant PASP, inadequate TR jet. Venous: The inferior vena cava is normal in size with greater than 50% respiratory variability, suggesting right atrial pressure of 3 mmHg. IAS/Shunts: No atrial level shunt detected by color flow Doppler.  LEFT VENTRICLE PLAX 2D LVIDd:         5.20 cm      Diastology LVIDs:         4.00 cm      LV e' medial:    8.08 cm/s LV PW:         0.70 cm      LV E/e' medial:  9.4 LV IVS:        0.80 cm      LV e' lateral:   10.30 cm/s LVOT diam:     2.30 cm      LV E/e' lateral: 7.3 LV SV:         76 LV SV Index:   35 LVOT Area:     4.15 cm  LV Volumes (MOD) LV vol d, MOD A2C: 96.7 ml LV vol d, MOD A4C: 109.0 ml LV vol s, MOD A2C: 44.3 ml LV vol s, MOD A4C: 68.0 ml LV SV MOD A2C:     52.4 ml LV SV MOD A4C:     109.0 ml LV SV MOD BP:      49.8 ml RIGHT VENTRICLE RV S prime:     14.70 cm/s TAPSE (M-mode): 2.4 cm LEFT ATRIUM             Index       RIGHT ATRIUM           Index LA diam:        3.70 cm 1.70 cm/m  RA Area:     13.10 cm LA Vol (A2C):   53.8 ml 24.79 ml/m RA Volume:   26.90 ml  12.40 ml/m LA Vol (A4C):   53.6 ml 24.70 ml/m LA Biplane Vol: 54.6 ml 25.16 ml/m  AORTIC VALVE AV Area (Vmax):    3.79 cm AV Area (Vmean):   4.09 cm AV Area (VTI):     3.66 cm AV Vmax:           116.41 cm/s AV Vmean:          74.045 cm/s AV VTI:            0.209 m AV Peak Grad:      5.4 mmHg AV Mean Grad:      2.6 mmHg LVOT Vmax:         106.06  cm/s LVOT Vmean:        72.900 cm/s LVOT VTI:          0.183 m LVOT/AV VTI ratio: 0.88  AORTA Ao Root diam: 3.50 cm Ao Asc diam:  3.20 cm MITRAL VALVE MV Area (PHT): 3.42 cm    SHUNTS MV Decel Time: 222 msec    Systemic VTI:  0.18 m MV E velocity: 75.60  cm/s  Systemic Diam: 2.30 cm MV A velocity: 75.60 cm/s MV E/A ratio:  1.00 Dina Rich MD Electronically signed by Dina Rich MD Signature Date/Time: 12/11/2020/11:02:16 AM    Final     Cardiac Studies   Echocardiogram 12/11/2020: Impressions: 1. Left ventricular ejection fraction, by estimation, is 50 to 55%. The  left ventricle has low normal function. The left ventricle has no regional  wall motion abnormalities. Left ventricular diastolic parameters were  normal.   2. Right ventricular systolic function is normal. The right ventricular  size is normal.   3. The mitral valve is normal in structure. No evidence of mitral valve  regurgitation. No evidence of mitral stenosis.   4. The aortic valve is tricuspid. Aortic valve regurgitation is not  visualized. No aortic stenosis is present.   5. The inferior vena cava is normal in size with greater than 50%  respiratory variability, suggesting right atrial pressure of 3 mmHg.  Patient Profile     59 y.o. male with a history of non-obstructive CAD on cath in 10/2019, hypertension, hyperlipidemia, and uncontrolled type 2 diabetes mellitus who was sent to the ED from our our office on 12/10/2020 due to concerns for unstable angina with plans for cardiac catheterization.  Assessment & Plan    Chest Pain History of Non-Obstructive CAD - EKG shows non-specific ST/T changes. - High-sensitivity troponin negative x3. - Echo showed LVEF of 50-55% with normal wall motion and diastolic dysfunction. - He continues to report some chest pain/discomfort when ambulating. - Currently on IV Heparin.  - Continue aspirin, beta-blocker, and high-intensity statin. Continue Imdur 15mg  daily and Ranexa  500mg  twice daily. - Plan is for cardiac catheterization tomorrow. The patient understands that risks include but are not limited to stroke (1 in 1000), death (1 in 1000), kidney failure [usually temporary] (1 in 500), bleeding (1 in 200), allergic reaction [possibly serious] (1 in 200), and agrees to proceed.     Multifocal Pneumonia - Still has some shortness of breath but is improving. - Chest x-ray on admission showed multifocal pneumonia.  - [facility] was consulted and has started patient on Augmentin and Azithromycin. - Management per [facility].   Hypertension - BP mostly well controlled. - Continue Coreg and Imdur. Heart rates better controlled after increase in Coreg yesterday.   Hyperlipidemia - Lipid panel this admission: Total Cholesterol 147, Triglycerides 370, HDL 32, LDL 41.  - Continue Lipitor 40mg  daily, Zetia 10mg  daily, and fenofibrate.    Uncontrolled Type 2 Diabetes  - Hemoglobin A1c 13.4 this admission. - On Lantus and Novolog at home. - Management per [facility].   For questions or updates, please contact [facility] Please consult [email or web address] for contact info under        Signed, , PA-C  12/13/2020, 8:23 AM    Agree with note by , PA-C  No further CP. On IV hep. For Cor angio tomorrow   , M.D., FACP, [facility], 12/15/2020 [facility] [address] [place], [place]  [facility]  [phone] 12/13/2020 9:35 AM

## 2020-12-13 NOTE — Progress Notes (Addendum)
Progress Note  Patient Name: Samuel AcheRobert W Bailey Date of Encounter: 12/13/2020  [facility] Cardiologist: Lavona MoundKardie Tobb, DO   Subjective   No acute overnight events. Patient still has some shortness of breath but states he thinks it is improving. Also continues to have chest pain when ambulating as well as lightheadedness/dizziness. Plan is for left heart catheterization tomorrow.  Inpatient Medications    Scheduled Meds:  amoxicillin-clavulanate  1 tablet Oral BID   aspirin EC  81 mg Oral Daily   atorvastatin  40 mg Oral q1800   azithromycin  250 mg Oral Daily   carvedilol  12.5 mg Oral BID WC   ezetimibe  10 mg Oral Daily   fenofibrate  54 mg Oral Daily   gabapentin  300 mg Oral TID   insulin aspart  0-20 Units Subcutaneous TID WC   insulin aspart  0-5 Units Subcutaneous QHS   insulin aspart  4 Units Subcutaneous TID WC   insulin glargine  70 Units Subcutaneous BID   isosorbide mononitrate  15 mg Oral Daily   pantoprazole  40 mg Oral BID AC   ranolazine  500 mg Oral BID   roflumilast  500 mcg Oral Daily   umeclidinium bromide  1 puff Inhalation Daily   Continuous Infusions:  heparin 2,400 Units/hr (12/13/20 0712)   PRN Meds: acetaminophen, ALPRAZolam, nitroGLYCERIN, ondansetron (ZOFRAN) IV, zolpidem   Vital Signs    Vitals:   12/12/20 1728 12/12/20 1748 12/12/20 2010 12/13/20 0530  BP: 114/72 121/71  122/65  Pulse: (!) 102 99 91 86  Resp:  20 20   Temp:   98.5 F (36.9 C) 98.2 F (36.8 C)  TempSrc:   Oral Oral  SpO2:   97% 97%  Weight:      Height:        Intake/Output Summary (Last 24 hours) at 12/13/2020 0823 Last data filed at 12/12/2020 1808 Gross per 24 hour  Intake 949.6 ml  Output --  Net 949.6 ml   Last 3 Weights 12/12/2020 12/11/2020 12/10/2020  Weight (lbs) 229 lb 10.9 oz 230 lb 5.3 oz 230 lb 8 oz  Weight (kg) 104.182 kg 104.478 kg 104.554 kg      Telemetry    Normal sinus rhythm with rates in the 80's to 90's. - Personally  Reviewed  ECG    No new ECG tracing today. - Personally Reviewed  Physical Exam   GEN: No acute distress.   Neck: Supple. No JVD Cardiac: RRR. No murmurs, rubs, or gallops. Right radial pulse 2+. Respiratory: Initially had rhonchi but this cleared with coughing. Clear to auscultation bilaterally after coughing. GI: Soft, obese, and non-tender.  MS: No significant lower extremity edema. No deformity. Skin: Warm and dry. Neuro:  No focal deficits. Psych: Normal affect. Responds appropriately.   Labs    High Sensitivity Troponin:   Recent Labs  Lab 12/10/20 1732 12/10/20 1933 12/10/20 2306 12/11/20 0253  TROPONINIHS 6 7 7 7       Chemistry Recent Labs  Lab 12/10/20 1732 12/11/20 0253 12/13/20 0500  NA 133* 135 138  K 3.7 3.7 3.8  CL 100 99 105  CO2 22 24 23   GLUCOSE 338* 333* 163*  BUN 7 8 7   CREATININE 0.78 0.88 0.86  CALCIUM 9.2 8.8* 8.4*  PROT  --  5.6*  --   ALBUMIN  --  2.3*  --   AST  --  10*  --   ALT  --  11  --  ALKPHOS  --  57  --   BILITOT  --  0.6  --   GFRNONAA >60 >60 >60  ANIONGAP 11 12 10      Hematology Recent Labs  Lab 12/11/20 0253 12/12/20 0144 12/13/20 0500  WBC 6.5 6.0 6.0  RBC 4.39 4.41 4.15*  HGB 13.0 12.7* 12.4*  HCT 36.9* 37.9* 35.7*  MCV 84.1 85.9 86.0  MCH 29.6 28.8 29.9  MCHC 35.2 33.5 34.7  RDW 13.0 13.1 13.3  PLT 243 234 220    BNP Recent Labs  Lab 12/10/20 1732  BNP 26.7     DDimer No results for input(s): DDIMER in the last 168 hours.   Radiology    ECHOCARDIOGRAM COMPLETE  Result Date: 12/11/2020    ECHOCARDIOGRAM REPORT   Patient Name:   Samuel Bailey Date of Exam: 12/11/2020 Medical Rec #:  12/13/2020     Height:       68.0 in Accession #:    [id number]    Weight:       230.3 lb Date of Birth:  June 19, 1961     BSA:          2.170 m Patient Age:    59 years      BP:           126/84 mmHg Patient Gender: M             HR:           77 bpm. Exam Location:  Inpatient Procedure: 2D Echo, Color Doppler and  Cardiac Doppler Indications:    Dyspnea R06.00                 Chest Pain R07.9  History:        Patient has prior history of Echocardiogram examinations, most                 recent 10/31/2019. CAD, COPD, Signs/Symptoms:Syncope; Risk                 Factors:Diabetes, Current Smoker, Hypertension and Dyslipidemia.  Sonographer:    13/11/2019 RDCS Referring Phys: 50 RHONDA G BARRETT IMPRESSIONS  1. Left ventricular ejection fraction, by estimation, is 50 to 55%. The left ventricle has low normal function. The left ventricle has no regional wall motion abnormalities. Left ventricular diastolic parameters were normal.  2. Right ventricular systolic function is normal. The right ventricular size is normal.  3. The mitral valve is normal in structure. No evidence of mitral valve regurgitation. No evidence of mitral stenosis.  4. The aortic valve is tricuspid. Aortic valve regurgitation is not visualized. No aortic stenosis is present.  5. The inferior vena cava is normal in size with greater than 50% respiratory variability, suggesting right atrial pressure of 3 mmHg. FINDINGS  Left Ventricle: Left ventricular ejection fraction, by estimation, is 50 to 55%. The left ventricle has low normal function. The left ventricle has no regional wall motion abnormalities. The left ventricular internal cavity size was normal in size. There is no left ventricular hypertrophy. Left ventricular diastolic parameters were normal. Right Ventricle: The right ventricular size is normal. No increase in right ventricular wall thickness. Right ventricular systolic function is normal. Left Atrium: Left atrial size was normal in size. Right Atrium: Right atrial size was normal in size. Pericardium: There is no evidence of pericardial effusion. Mitral Valve: The mitral valve is normal in structure. No evidence of mitral valve regurgitation. No evidence of mitral valve stenosis. Tricuspid Valve:  The tricuspid valve is normal in structure.  Tricuspid valve regurgitation is not demonstrated. No evidence of tricuspid stenosis. Aortic Valve: The aortic valve is tricuspid. Aortic valve regurgitation is not visualized. No aortic stenosis is present. Aortic valve mean gradient measures 2.6 mmHg. Aortic valve peak gradient measures 5.4 mmHg. Aortic valve area, by VTI measures 3.66 cm. Pulmonic Valve: The pulmonic valve was not well visualized. Pulmonic valve regurgitation is not visualized. No evidence of pulmonic stenosis. Aorta: The aortic root is normal in size and structure. Pulmonary Artery: Indeterminant PASP, inadequate TR jet. Venous: The inferior vena cava is normal in size with greater than 50% respiratory variability, suggesting right atrial pressure of 3 mmHg. IAS/Shunts: No atrial level shunt detected by color flow Doppler.  LEFT VENTRICLE PLAX 2D LVIDd:         5.20 cm      Diastology LVIDs:         4.00 cm      LV e' medial:    8.08 cm/s LV PW:         0.70 cm      LV E/e' medial:  9.4 LV IVS:        0.80 cm      LV e' lateral:   10.30 cm/s LVOT diam:     2.30 cm      LV E/e' lateral: 7.3 LV SV:         76 LV SV Index:   35 LVOT Area:     4.15 cm  LV Volumes (MOD) LV vol d, MOD A2C: 96.7 ml LV vol d, MOD A4C: 109.0 ml LV vol s, MOD A2C: 44.3 ml LV vol s, MOD A4C: 68.0 ml LV SV MOD A2C:     52.4 ml LV SV MOD A4C:     109.0 ml LV SV MOD BP:      49.8 ml RIGHT VENTRICLE RV S prime:     14.70 cm/s TAPSE (M-mode): 2.4 cm LEFT ATRIUM             Index       RIGHT ATRIUM           Index LA diam:        3.70 cm 1.70 cm/m  RA Area:     13.10 cm LA Vol (A2C):   53.8 ml 24.79 ml/m RA Volume:   26.90 ml  12.40 ml/m LA Vol (A4C):   53.6 ml 24.70 ml/m LA Biplane Vol: 54.6 ml 25.16 ml/m  AORTIC VALVE AV Area (Vmax):    3.79 cm AV Area (Vmean):   4.09 cm AV Area (VTI):     3.66 cm AV Vmax:           116.41 cm/s AV Vmean:          74.045 cm/s AV VTI:            0.209 m AV Peak Grad:      5.4 mmHg AV Mean Grad:      2.6 mmHg LVOT Vmax:         106.06  cm/s LVOT Vmean:        72.900 cm/s LVOT VTI:          0.183 m LVOT/AV VTI ratio: 0.88  AORTA Ao Root diam: 3.50 cm Ao Asc diam:  3.20 cm MITRAL VALVE MV Area (PHT): 3.42 cm    SHUNTS MV Decel Time: 222 msec    Systemic VTI:  0.18 m MV E velocity: 75.60  cm/s  Systemic Diam: 2.30 cm MV A velocity: 75.60 cm/s MV E/A ratio:  1.00 Dina Rich MD Electronically signed by Dina Rich MD Signature Date/Time: 12/11/2020/11:02:16 AM    Final     Cardiac Studies   Echocardiogram 12/11/2020: Impressions: 1. Left ventricular ejection fraction, by estimation, is 50 to 55%. The  left ventricle has low normal function. The left ventricle has no regional  wall motion abnormalities. Left ventricular diastolic parameters were  normal.   2. Right ventricular systolic function is normal. The right ventricular  size is normal.   3. The mitral valve is normal in structure. No evidence of mitral valve  regurgitation. No evidence of mitral stenosis.   4. The aortic valve is tricuspid. Aortic valve regurgitation is not  visualized. No aortic stenosis is present.   5. The inferior vena cava is normal in size with greater than 50%  respiratory variability, suggesting right atrial pressure of 3 mmHg.  Patient Profile     59 y.o. male with a history of non-obstructive CAD on cath in 10/2019, hypertension, hyperlipidemia, and uncontrolled type 2 diabetes mellitus who was sent to the ED from our our office on 12/10/2020 due to concerns for unstable angina with plans for cardiac catheterization.  Assessment & Plan    Chest Pain History of Non-Obstructive CAD - EKG shows non-specific ST/T changes. - High-sensitivity troponin negative x3. - Echo showed LVEF of 50-55% with normal wall motion and diastolic dysfunction. - He continues to report some chest pain/discomfort when ambulating. - Currently on IV Heparin.  - Continue aspirin, beta-blocker, and high-intensity statin. Continue Imdur 15mg  daily and Ranexa  500mg  twice daily. - Plan is for cardiac catheterization tomorrow. The patient understands that risks include but are not limited to stroke (1 in 1000), death (1 in 1000), kidney failure [usually temporary] (1 in 500), bleeding (1 in 200), allergic reaction [possibly serious] (1 in 200), and agrees to proceed.     Multifocal Pneumonia - Still has some shortness of breath but is improving. - Chest x-ray on admission showed multifocal pneumonia.  - [facility] was consulted and has started patient on Augmentin and Azithromycin. - Management per [facility].   Hypertension - BP mostly well controlled. - Continue Coreg and Imdur. Heart rates better controlled after increase in Coreg yesterday.   Hyperlipidemia - Lipid panel this admission: Total Cholesterol 147, Triglycerides 370, HDL 32, LDL 41.  - Continue Lipitor 40mg  daily, Zetia 10mg  daily, and fenofibrate.    Uncontrolled Type 2 Diabetes  - Hemoglobin A1c 13.4 this admission. - On Lantus and Novolog at home. - Management per [facility].   For questions or updates, please contact [facility] Please consult [email or web address] for contact info under        Signed, , PA-C  12/13/2020, 8:23 AM    Agree with note by , PA-C  No further CP. On IV hep. For Cor angio tomorrow   , M.D., FACP, [facility], 12/15/2020 [facility] [address] [place], [place] Eriksson  [facility]  [phone] 12/13/2020 9:35 AM

## 2020-12-13 NOTE — Progress Notes (Signed)
PROGRESS NOTE    Samuel AcheRobert W Bailey  ZOX:[id number] DOB: 21-Jul-1961 DOA: 12/10/2020 PCP: Wilmer Floorampbell, Stephen D., MD   Brief Narrative:  Samuel AcheRobert W Bailey is an 59 y.o. male with PMH of uncontrolled DM2, CAD, COPD (Gold 0 - 1), GERD, h/o CVA, h/o peptic ulcer disease, HTN, HLD who presented on 12/10/20 for progressive chest pain concerning for unstable angina.  He was admitted to the Cardiology for further evaluation [facility]  During initial evaluation, CXR was done which was concerning for multifocal pneumonia.  Also A1C returned at 13.4 and thus [facility] was contacted for consultation.   Mr. Samuel Bailey reports that he has been SOB for about 1 month.  The SOB comes on at any time, rest and exertion.  He notes that it is slowly getting worse and associated with central chest pain which is dull in nature.  He notes no cold symptoms, no rhinorrhea or sinus pressure.  He has not had any fever or chills.  He does note a cough for the same time period which produces only whitish phlegm and congested feeling in his lungs.  He has had exposure to his grandson who has had a cold.  Further symptoms include dizziness and lightheadedness upon standing for 1 month.  He is being followed by his Cardiologist Dr. Servando Salinaobb for volume overload and was on lasix for 5 days.  However, he notes that this "dried him out."  He lives in a mobile home that was recently renovated.  Air conditioning units are newer and are window units.  No one else living in the home is ill.  He has not had his COVID vaccine.  He did have a negative COVID and influenza PCR on 12/23.  He does have a history of COPD, but is only on reflumilast.  I reviewed PFTs from 2016 which showed only mild obstructive pattern.    As concerns his diabetes, he notes that he has been taking his insulin as prescribed, however, his blood sugars continue to run in the 300s at home.  He is in discussion with his PCP about getting an insulin pump to  help better control his blood sugars.   Assessment & Plan:   Active Problems:   Uncontrolled type 2 diabetes with neuropathy (HCC)   HTN (hypertension)   GERD (gastroesophageal reflux disease)   Hyperlipidemia   COPD with chronic bronchitis (HCC)   Mild CAD   Unstable angina (HCC)   Multifocal pneumonia   Mild CAD Unstable angina .  [facility] - Per primary team  History of COPD with chronic bronchitis now with multifocal pneumonia:  COPD with chronic bronchitis - Seen on xray as noted above. COVID and influenza negative.  Sick contact in grandson.  DDx includes CAP from a bacterial source vs. Viral pneumonia.  Urine antigen for streptococci is pending.  Still complains of shortness of breath.  Will check RVP.  Continue current antibiotics.  His symptoms are improving and he is not hypoxic.  Uncontrolled type 2 diabetes with neuropathy - A1C is 13.4.  He notes prolonged history of uncontrolled DM. It appears [facility] On Lantus 70 units twice daily.  Pressure controlled.  Continue SSI.    HTN (hypertension) - Per primary team    GERD (gastroesophageal reflux disease) - Continue PPI    Hyperlipidemia: On a statin. - Per primary team    DVT prophylaxis:    Code Status:  Full Code  Family Communication:  None present at bedside.  Plan of care discussed with patient in length and he verbalized understanding and agreed with it.  Status is: Inpatient  Remains inpatient appropriate because:Ongoing diagnostic testing needed not appropriate for outpatient work up   Dispo: The patient is from: Home              Anticipated d/c is to: Per primary team              Anticipated d/c date is: Per primary team              Patient currently is not medically stable to d/c.        Estimated body mass index is 34.92 kg/m as calculated from the following:   Height as of this encounter: 5\' 8"  (1.727 m).   Weight as of this  encounter: 104.2 kg.      Nutritional status:               Consultants:   TRH  Procedures:   None  Antimicrobials:  Anti-infectives (From admission, onward)   Start     Dose/Rate Route Frequency Ordered Stop   12/12/20 1000  azithromycin (ZITHROMAX) tablet 250 mg       "Followed by" Linked Group Details   250 mg Oral Daily 12/11/20 1241 12/16/20 0959   12/11/20 1330  amoxicillin-clavulanate (AUGMENTIN) 875-125 MG per tablet 1 tablet        1 tablet Oral 2 times daily 12/11/20 1241 12/16/20 0959   12/11/20 1330  azithromycin (ZITHROMAX) tablet 500 mg       "Followed by" Linked Group Details   500 mg Oral Daily 12/11/20 1241 12/11/20 1524   12/10/20 1930  cefTRIAXone (ROCEPHIN) 1 g in sodium chloride 0.9 % 100 mL IVPB        1 g 200 mL/hr over 30 Minutes Intravenous  Once 12/10/20 1918 12/10/20 2120   12/10/20 1930  azithromycin (ZITHROMAX) 500 mg in sodium chloride 0.9 % 250 mL IVPB        500 mg 250 mL/hr over 60 Minutes Intravenous  Once 12/10/20 1918 12/10/20 2136         Subjective: Patient seen and examined.  He thinks that his breathing is improving but not back to baseline yet.  No new complaint.  Objective: Vitals:   12/12/20 1728 12/12/20 1748 12/12/20 2010 12/13/20 0530  BP: 114/72 121/71  122/65  Pulse: (!) 102 99 91 86  Resp:  20 20   Temp:   98.5 F (36.9 C) 98.2 F (36.8 C)  TempSrc:   Oral Oral  SpO2:   97% 97%  Weight:      Height:        Intake/Output Summary (Last 24 hours) at 12/13/2020 1117 Last data filed at 12/12/2020 1808 Gross per 24 hour  Intake 949.6 ml  Output --  Net 949.6 ml   Filed Weights   12/10/20 2227 12/11/20 0511 12/12/20 0522  Weight: 104.6 kg 104.5 kg 104.2 kg    Examination:  General exam: Appears calm and comfortable  Respiratory system: Rhonchi bilaterally in the middle lobes, more on the right than the left. Respiratory effort normal. Cardiovascular system: S1 & S2 heard, RRR. No JVD, murmurs,  rubs, gallops or clicks. No pedal edema. Gastrointestinal system: Abdomen is nondistended, soft and nontender. No organomegaly or masses felt. Normal bowel sounds heard. Central nervous system: Alert and oriented. No focal neurological deficits. Extremities: Symmetric 5  x 5 power. Skin: No rashes, lesions or ulcers.  Psychiatry: Judgement and insight appear normal. Mood & affect appropriate.   Data Reviewed: I have personally reviewed following labs and imaging studies  CBC: Recent Labs  Lab 12/10/20 1732 12/11/20 0253 12/12/20 0144 12/13/20 0500  WBC 8.5 6.5 6.0 6.0  NEUTROABS 4.4  --   --   --   HGB 14.0 13.0 12.7* 12.4*  HCT 41.5 36.9* 37.9* 35.7*  MCV 85.6 84.1 85.9 86.0  PLT 276 243 234 220   Basic Metabolic Panel: Recent Labs  Lab 12/10/20 1732 12/11/20 0253 12/13/20 0500  NA 133* 135 138  K 3.7 3.7 3.8  CL 100 99 105  CO2 22 24 23   GLUCOSE 338* 333* 163*  BUN 7 8 7   CREATININE 0.78 0.88 0.86  CALCIUM 9.2 8.8* 8.4*   GFR: Estimated Creatinine Clearance: 109.5 mL/min (by C-G formula based on SCr of 0.86 mg/dL). Liver Function Tests: Recent Labs  Lab 12/11/20 0253  AST 10*  ALT 11  ALKPHOS 57  BILITOT 0.6  PROT 5.6*  ALBUMIN 2.3*   No results for input(s): LIPASE, AMYLASE in the last 168 hours. No results for input(s): AMMONIA in the last 168 hours. Coagulation Profile: No results for input(s): INR, PROTIME in the last 168 hours. Cardiac Enzymes: No results for input(s): CKTOTAL, CKMB, CKMBINDEX, TROPONINI in the last 168 hours. BNP (last 3 results) No results for input(s): PROBNP in the last 8760 hours. HbA1C: Recent Labs    12/10/20 2306  HGBA1C 13.4*   CBG: Recent Labs  Lab 12/12/20 0731 12/12/20 1151 12/12/20 1652 12/12/20 2147 12/13/20 0712  GLUCAP 328* 185* 211* 167* 144*   Lipid Profile: Recent Labs    12/13/20 0500  CHOL 147  HDL 32*  LDLCALC 41  TRIG 12/15/20*  CHOLHDL 4.6   Thyroid Function Tests: Recent Labs     12/10/20 2306  TSH 2.505   Anemia Panel: No results for input(s): VITAMINB12, FOLATE, FERRITIN, TIBC, IRON, RETICCTPCT in the last 72 hours. Sepsis Labs: Recent Labs  Lab 12/12/20 1210  PROCALCITON <0.10    Recent Results (from the past 240 hour(s))  Resp Panel by RT-PCR (Flu A&B, Covid) Nasopharyngeal Swab     Status: None   Collection Time: 12/10/20  5:32 PM   Specimen: Nasopharyngeal Swab; Nasopharyngeal(NP) swabs in vial transport medium  Result Value Ref Range Status   SARS Coronavirus 2 by RT PCR NEGATIVE NEGATIVE Final    Comment: (NOTE) SARS-CoV-2 target nucleic acids are NOT DETECTED.  The SARS-CoV-2 RNA is generally detectable in upper respiratory specimens during the acute phase of infection. The lowest concentration of SARS-CoV-2 viral copies this assay can detect is 138 copies/mL. A negative result does not preclude SARS-Cov-2 infection and should not be used as the sole basis for treatment or other patient management decisions. A negative result may occur with  improper specimen collection/handling, submission of specimen other than nasopharyngeal swab, presence of viral mutation(s) within the areas targeted by this assay, and inadequate number of viral copies(<138 copies/mL). A negative result must be combined with clinical observations, patient history, and epidemiological information. The expected result is Negative.  Fact Sheet for Patients:  12/14/20  Fact Sheet for Healthcare Providers:  12/12/20  This test is no t yet approved or cleared by the [employer] FDA and  has been authorized for detection and/or diagnosis of SARS-CoV-2 by FDA under an Emergency Use Authorization (EUA). This EUA will remain  in effect (meaning  this test can be used) for the duration of the COVID-19 declaration under Section 564(b)(1) of the Act, 21 U.S.C.section 360bbb-3(b)(1), unless the authorization is  terminated  or revoked sooner.       Influenza A by PCR NEGATIVE NEGATIVE Final   Influenza B by PCR NEGATIVE NEGATIVE Final    Comment: (NOTE) The Xpert Xpress SARS-CoV-2/FLU/RSV plus assay is intended as an aid in the diagnosis of influenza from Nasopharyngeal swab specimens and should not be used as a sole basis for treatment. Nasal washings and aspirates are unacceptable for Xpert Xpress SARS-CoV-2/FLU/RSV testing.  Fact Sheet for Patients: BloggerCourse.com  Fact Sheet for Healthcare Providers: SeriousBroker.it  This test is not yet approved or cleared by the [place] FDA and has been authorized for detection and/or diagnosis of SARS-CoV-2 by FDA under an Emergency Use Authorization (EUA). This EUA will remain in effect (meaning this test can be used) for the duration of the COVID-19 declaration under Section 564(b)(1) of the Act, 21 U.S.C. section 360bbb-3(b)(1), unless the authorization is terminated or revoked.  Performed at [facility], [address] [place], [place] [address]       Radiology Studies: No results found.  Scheduled Meds: . amoxicillin-clavulanate  1 tablet Oral BID  . aspirin EC  81 mg Oral Daily  . atorvastatin  40 mg Oral q1800  . azithromycin  250 mg Oral Daily  . carvedilol  12.5 mg Oral BID WC  . ezetimibe  10 mg Oral Daily  . fenofibrate  54 mg Oral Daily  . gabapentin  300 mg Oral TID  . insulin aspart  0-20 Units Subcutaneous TID WC  . insulin aspart  0-5 Units Subcutaneous QHS  . insulin aspart  4 Units Subcutaneous TID WC  . insulin glargine  70 Units Subcutaneous BID  . isosorbide mononitrate  15 mg Oral Daily  . pantoprazole  40 mg Oral BID AC  . ranolazine  500 mg Oral BID  . roflumilast  500 mcg Oral Daily  . umeclidinium bromide  1 puff Inhalation Daily   Continuous Infusions: . heparin 2,400 Units/hr (12/13/20 0712)     LOS: 3 days   Time spent: 28  minutes   Hughie Closs, MD [facility]  12/13/2020, 11:17 AM   To contact the attending provider between 7A-7P or the covering provider during after hours 7P-7A, please log into the web site [email or web address].

## 2020-12-13 NOTE — Progress Notes (Signed)
ANTICOAGULATION CONSULT NOTE   Pharmacy Consult for heparin Indication: chest pain/ACS  Allergies  Allergen Reactions  . Lovastatin     diarrhea    Patient Measurements: Height: 5\' 8"  (172.7 cm) Weight: 104.2 kg (229 lb 10.9 oz) IBW/kg (Calculated) : 68.4 Heparin Dosing Weight: 90.4 kg   Vital Signs: Temp: 98.2 F (36.8 C) (12/26 0530) Temp Source: Oral (12/26 0530) BP: 122/65 (12/26 0530) Pulse Rate: 86 (12/26 0530)  Labs: Recent Labs    12/10/20 1732 12/10/20 1933 12/10/20 2306 12/11/20 0253 12/11/20 0929 12/12/20 0144 12/12/20 0849 12/13/20 0500  HGB 14.0  --   --  13.0  --  12.7*  --  12.4*  HCT 41.5  --   --  36.9*  --  37.9*  --  35.7*  PLT 276  --   --  243  --  234  --  220  HEPARINUNFRC  --   --   --  <0.10*   < > 0.19* 0.34 0.45  CREATININE 0.78  --   --  0.88  --   --   --  0.86  TROPONINIHS 6 7 7 7   --   --   --   --    < > = values in this interval not displayed.    Estimated Creatinine Clearance: 109.5 mL/min (by C-G formula based on SCr of 0.86 mg/dL).   Assessment: 40 YOM who arrives via EMS from cardiologist office with chest pain and worsening SOB. Pharmacy consulted to start IV heparin for ACS.  Heparin level remains therapeutic (0.45) on gtt at 2400 units/hr. CBC stable, no bleeding noted. LHC planned for Monday.   Goal of Therapy:  Heparin level 0.3-0.7 units/ml Monitor platelets by anticoagulation protocol: Yes   Plan:  Continue heparin at 2400 units/hr  Daily heparin level, CBC while on heparin Follow up anticoagulation plans following cath tomorrow   42, PharmD PGY1 Pharmacy Resident 12/13/2020 7:37 AM  Please check [email or web address] for unit-specific pharmacy phone numbers.

## 2020-12-14 ENCOUNTER — Encounter (HOSPITAL_COMMUNITY): Payer: Self-pay | Admitting: Interventional Cardiology

## 2020-12-14 ENCOUNTER — Inpatient Hospital Stay (HOSPITAL_COMMUNITY)
Admission: EM | Disposition: A | Discharge status: Home / Self Care | Payer: Self-pay | Source: Home / Self Care | Attending: Cardiology

## 2020-12-14 ENCOUNTER — Other Ambulatory Visit (HOSPITAL_COMMUNITY): Payer: Self-pay | Admitting: Physician Assistant

## 2020-12-14 DIAGNOSIS — I200 Unstable angina: Secondary | ICD-10-CM | POA: Diagnosis not present

## 2020-12-14 DIAGNOSIS — I2510 Atherosclerotic heart disease of native coronary artery without angina pectoris: Secondary | ICD-10-CM | POA: Diagnosis not present

## 2020-12-14 DIAGNOSIS — I10 Essential (primary) hypertension: Secondary | ICD-10-CM | POA: Diagnosis not present

## 2020-12-14 DIAGNOSIS — I209 Angina pectoris, unspecified: Secondary | ICD-10-CM | POA: Diagnosis not present

## 2020-12-14 DIAGNOSIS — J449 Chronic obstructive pulmonary disease, unspecified: Secondary | ICD-10-CM

## 2020-12-14 HISTORY — PX: LEFT HEART CATH AND CORONARY ANGIOGRAPHY: CATH118249

## 2020-12-14 LAB — BASIC METABOLIC PANEL
Anion gap: 11 (ref 5–15)
BUN: 7 mg/dL (ref 6–20)
CO2: 21 mmol/L — ABNORMAL LOW (ref 22–32)
Calcium: 8.4 mg/dL — ABNORMAL LOW (ref 8.9–10.3)
Chloride: 108 mmol/L (ref 98–111)
Creatinine, Ser: 0.94 mg/dL (ref 0.61–1.24)
GFR, Estimated: >60 mL/min (ref >60)
Glucose, Bld: 220 mg/dL — ABNORMAL HIGH (ref 70–99)
Potassium: 3.9 mmol/L (ref 3.5–5.1)
Sodium: 140 mmol/L (ref 135–145)

## 2020-12-14 LAB — CBC
HCT: 36.8 % — ABNORMAL LOW (ref 39.0–52.0)
Hemoglobin: 11.8 g/dL — ABNORMAL LOW (ref 13.0–17.0)
MCH: 28.7 pg (ref 26.0–34.0)
MCHC: 32.1 g/dL (ref 30.0–36.0)
MCV: 89.5 fL (ref 80.0–100.0)
Platelets: 210 10*3/uL (ref 150–400)
RBC: 4.11 MIL/uL — ABNORMAL LOW (ref 4.22–5.81)
RDW: 13.6 % (ref 11.5–15.5)
WBC: 6.6 10*3/uL (ref 4.0–10.5)
nRBC: 0.3 % — ABNORMAL HIGH (ref 0.0–0.2)

## 2020-12-14 LAB — HEPARIN LEVEL (UNFRACTIONATED): Heparin Unfractionated: 0.27 IU/mL — ABNORMAL LOW (ref 0.30–0.70)

## 2020-12-14 LAB — GLUCOSE, CAPILLARY
Glucose-Capillary: 169 mg/dL — ABNORMAL HIGH (ref 70–99)
Glucose-Capillary: 161 mg/dL — ABNORMAL HIGH (ref 70–99)

## 2020-12-14 SURGERY — LEFT HEART CATH AND CORONARY ANGIOGRAPHY
Anesthesia: LOCAL

## 2020-12-14 MED ORDER — ISOSORBIDE MONONITRATE ER 30 MG PO TB24: 30.0000 mg | ORAL_TABLET | Freq: Every day | ORAL | 3 refills | Status: DC

## 2020-12-14 MED ORDER — SODIUM CHLORIDE 0.9 % IV SOLN: 250.0000 mL | INTRAVENOUS | Status: DC | PRN

## 2020-12-14 MED ORDER — HYDRALAZINE HCL 20 MG/ML IJ SOLN: 10.0000 mg | INTRAMUSCULAR | Status: AC | PRN

## 2020-12-14 MED ORDER — METOPROLOL SUCCINATE ER 25 MG PO TB24: 12.5000 mg | ORAL_TABLET | Freq: Every day | ORAL | 3 refills | Status: DC

## 2020-12-14 MED ORDER — MIDAZOLAM HCL 2 MG/2ML IJ SOLN
INTRAMUSCULAR | Status: DC | PRN
  Administered 2020-12-14: 1 mg via INTRAVENOUS

## 2020-12-14 MED ORDER — ACETAMINOPHEN 325 MG PO TABS: 650.0000 mg | ORAL_TABLET | ORAL | Status: DC | PRN

## 2020-12-14 MED ORDER — HEPARIN SODIUM (PORCINE) 1000 UNIT/ML IJ SOLN
INTRAMUSCULAR | Status: AC
  Filled 2020-12-14: qty 1

## 2020-12-14 MED ORDER — HEPARIN (PORCINE) IN NACL 1000-0.9 UT/500ML-% IV SOLN
INTRAVENOUS | Status: AC
  Filled 2020-12-14: qty 1000

## 2020-12-14 MED ORDER — METOPROLOL SUCCINATE ER 25 MG PO TB24
12.5000 mg | ORAL_TABLET | Freq: Every day | ORAL | Status: DC
Start: 2020-12-14 — End: 2020-12-14
  Administered 2020-12-14: 14:00:00 12.5 mg via ORAL
  Filled 2020-12-14: qty 1

## 2020-12-14 MED ORDER — LOSARTAN POTASSIUM 25 MG PO TABS
12.5000 mg | ORAL_TABLET | Freq: Every day | ORAL | Status: DC
  Administered 2020-12-14: 11:00:00 12.5 mg via ORAL
  Filled 2020-12-14: qty 1

## 2020-12-14 MED ORDER — IOHEXOL 350 MG/ML SOLN
INTRAVENOUS | Status: DC | PRN
  Administered 2020-12-14: 08:00:00 85 mL

## 2020-12-14 MED ORDER — LIDOCAINE HCL (PF) 1 % IJ SOLN
INTRAMUSCULAR | Status: AC
  Filled 2020-12-14: qty 30

## 2020-12-14 MED ORDER — HEPARIN SODIUM (PORCINE) 1000 UNIT/ML IJ SOLN
INTRAMUSCULAR | Status: DC | PRN
  Administered 2020-12-14: 5000 [IU] via INTRAVENOUS

## 2020-12-14 MED ORDER — HEPARIN SODIUM (PORCINE) 5000 UNIT/ML IJ SOLN: 5000.0000 [IU] | Freq: Three times a day (TID) | INTRAMUSCULAR | Status: DC

## 2020-12-14 MED ORDER — FENTANYL CITRATE (PF) 100 MCG/2ML IJ SOLN
INTRAMUSCULAR | Status: AC
  Filled 2020-12-14: qty 2

## 2020-12-14 MED ORDER — LOSARTAN POTASSIUM 25 MG PO TABS: 12.5000 mg | ORAL_TABLET | Freq: Every day | ORAL | 3 refills | Status: DC

## 2020-12-14 MED ORDER — VERAPAMIL HCL 2.5 MG/ML IV SOLN
INTRAVENOUS | Status: DC | PRN
  Administered 2020-12-14: 08:00:00 10 mL via INTRA_ARTERIAL

## 2020-12-14 MED ORDER — VERAPAMIL HCL 2.5 MG/ML IV SOLN
INTRAVENOUS | Status: AC
  Filled 2020-12-14: qty 2

## 2020-12-14 MED ORDER — SODIUM CHLORIDE 0.9% FLUSH: 3.0000 mL | INTRAVENOUS | Status: DC | PRN

## 2020-12-14 MED ORDER — ONDANSETRON HCL 4 MG/2ML IJ SOLN: 4.0000 mg | Freq: Four times a day (QID) | INTRAMUSCULAR | Status: DC | PRN

## 2020-12-14 MED ORDER — MIDAZOLAM HCL 2 MG/2ML IJ SOLN
INTRAMUSCULAR | Status: AC
  Filled 2020-12-14: qty 2

## 2020-12-14 MED ORDER — CEFDINIR 300 MG PO CAPS: 300.0000 mg | ORAL_CAPSULE | Freq: Two times a day (BID) | ORAL | 0 refills | Status: DC

## 2020-12-14 MED ORDER — NITROGLYCERIN 0.4 MG SL SUBL: 0.4000 mg | SUBLINGUAL_TABLET | SUBLINGUAL | 3 refills | Status: DC | PRN

## 2020-12-14 MED ORDER — FENTANYL CITRATE (PF) 100 MCG/2ML IJ SOLN
INTRAMUSCULAR | Status: DC | PRN
  Administered 2020-12-14: 25 ug via INTRAVENOUS

## 2020-12-14 MED ORDER — SODIUM CHLORIDE 0.9% FLUSH
3.0000 mL | Freq: Two times a day (BID) | INTRAVENOUS | Status: DC
  Administered 2020-12-14: 12:00:00 3 mL via INTRAVENOUS

## 2020-12-14 MED ORDER — SODIUM CHLORIDE 0.9 % IV SOLN: INTRAVENOUS | Status: AC

## 2020-12-14 MED ORDER — LIDOCAINE HCL (PF) 1 % IJ SOLN
INTRAMUSCULAR | Status: DC | PRN
  Administered 2020-12-14: 2 mL

## 2020-12-14 MED ORDER — INSULIN GLARGINE 100 UNITS/ML SOLOSTAR PEN: 70.0000 [IU] | PEN_INJECTOR | Freq: Two times a day (BID) | SUBCUTANEOUS | 11 refills | Status: DC

## 2020-12-14 MED ORDER — LABETALOL HCL 5 MG/ML IV SOLN: 10.0000 mg | INTRAVENOUS | Status: AC | PRN

## 2020-12-14 MED ORDER — ISOSORBIDE MONONITRATE ER 30 MG PO TB24
30.0000 mg | ORAL_TABLET | Freq: Every day | ORAL | Status: DC
  Administered 2020-12-14: 11:00:00 30 mg via ORAL

## 2020-12-14 MED ORDER — HEPARIN (PORCINE) IN NACL 1000-0.9 UT/500ML-% IV SOLN
INTRAVENOUS | Status: DC | PRN
  Administered 2020-12-14 (×2): 500 mL

## 2020-12-14 MED FILL — NITROGLYCERIN 0.4 MG TAB SL: 0.4 | 7 days supply | Qty: 25 | Fill #0

## 2020-12-14 MED FILL — METOPROLOL SUCCINATE ER 25: 25 | 90 days supply | Qty: 45 | Fill #0

## 2020-12-14 MED FILL — CEFDINIR 300 MG CAPSULE: 300 | 5 days supply | Qty: 10 | Fill #0

## 2020-12-14 MED FILL — LOSARTAN POTASSIUM 25 MG TA: 25 | 90 days supply | Qty: 45 | Fill #0

## 2020-12-14 SURGICAL SUPPLY — 10 items
CATH 5FR JL3.5 JR4 ANG PIG MP (CATHETERS) ×1 IMPLANT
DEVICE RAD COMP TR BAND LRG (VASCULAR PRODUCTS) ×1 IMPLANT
GLIDESHEATH SLEND A-KIT 6F 22G (SHEATH) ×1 IMPLANT
GUIDEWIRE INQWIRE 1.5J.035X260 (WIRE) IMPLANT
INQWIRE 1.5J .035X260CM (WIRE) ×2
KIT HEART LEFT (KITS) ×2 IMPLANT
PACK CARDIAC CATHETERIZATION (CUSTOM PROCEDURE TRAY) ×2 IMPLANT
SHEATH PROBE COVER 6X72 (BAG) ×1 IMPLANT
TRANSDUCER W/STOPCOCK (MISCELLANEOUS) ×2 IMPLANT
TUBING CIL FLEX 10 FLL-RA (TUBING) ×2 IMPLANT

## 2020-12-14 NOTE — Progress Notes (Addendum)
Progress Note  Patient Name: Samuel Bailey Date of Encounter: 12/14/2020  [facility] Cardiologist: Thomasene Ripple, DO   Subjective   Pt seen after heart cath, TR band in place. He is anxious to discharge home.  Inpatient Medications    Scheduled Meds: . amoxicillin-clavulanate  1 tablet Oral BID  . aspirin EC  81 mg Oral Daily  . atorvastatin  40 mg Oral q1800  . azithromycin  250 mg Oral Daily  . carvedilol  12.5 mg Oral BID WC  . ezetimibe  10 mg Oral Daily  . fenofibrate  54 mg Oral Daily  . gabapentin  300 mg Oral TID  . heparin  5,000 Units Subcutaneous Q8H  . insulin aspart  0-20 Units Subcutaneous TID WC  . insulin aspart  0-5 Units Subcutaneous QHS  . insulin aspart  4 Units Subcutaneous TID WC  . insulin glargine  70 Units Subcutaneous BID  . isosorbide mononitrate  15 mg Oral Daily  . pantoprazole  40 mg Oral BID AC  . ranolazine  500 mg Oral BID  . roflumilast  500 mcg Oral Daily  . sodium chloride flush  3 mL Intravenous Q12H  . umeclidinium bromide  1 puff Inhalation Daily   Continuous Infusions: . sodium chloride 125 mL/hr at 12/14/20 0842  . sodium chloride     PRN Meds: sodium chloride, acetaminophen, ALPRAZolam, hydrALAZINE, labetalol, nitroGLYCERIN, ondansetron (ZOFRAN) IV, sodium chloride flush, zolpidem   Vital Signs    Vitals:   12/14/20 0803 12/14/20 0808 12/14/20 0812 12/14/20 0838  BP: 128/77 133/81 138/81   Pulse: 85 80 83 75  Resp: (!) 24 20 (!) 28 18  Temp:    97.9 F (36.6 C)  TempSrc:    Oral  SpO2: 95% 95% 95%   Weight:      Height:        Intake/Output Summary (Last 24 hours) at 12/14/2020 0909 Last data filed at 12/14/2020 0716 Gross per 24 hour  Intake 893.87 ml  Output 1 ml  Net 892.87 ml   Last 3 Weights 12/14/2020 12/12/2020 12/11/2020  Weight (lbs) 237 lb 8 oz 229 lb 10.9 oz 230 lb 5.3 oz  Weight (kg) 107.729 kg 104.182 kg 104.478 kg      Telemetry    Sinus rhythm in the 80s - Personally Reviewed  ECG     No new tracings - Personally Reviewed  Physical Exam   GEN: No acute distress.   Neck: No JVD Cardiac: RRR, no murmurs, rubs, or gallops.  Respiratory: Clear to auscultation bilaterally. GI: Soft, nontender, non-distended  MS: No edema; No deformity. Neuro:  Nonfocal  Psych: Normal affect  Right radial site with TR band in place  Labs    High Sensitivity Troponin:   Recent Labs  Lab 12/10/20 1732 12/10/20 1933 12/10/20 2306 12/11/20 0253  TROPONINIHS 6 7 7 7       Chemistry Recent Labs  Lab 12/11/20 0253 12/13/20 0500 12/14/20 0308  NA 135 138 140  K 3.7 3.8 3.9  CL 99 105 108  CO2 24 23 21*  GLUCOSE 333* 163* 220*  BUN 8 7 7   CREATININE 0.88 0.86 0.94  CALCIUM 8.8* 8.4* 8.4*  PROT 5.6*  --   --   ALBUMIN 2.3*  --   --   AST 10*  --   --   ALT 11  --   --   ALKPHOS 57  --   --   BILITOT 0.6  --   --  GFRNONAA >60 >60 >60  ANIONGAP 12 10 11      Hematology Recent Labs  Lab 12/12/20 0144 12/13/20 0500 12/14/20 0308  WBC 6.0 6.0 6.6  RBC 4.41 4.15* 4.11*  HGB 12.7* 12.4* 11.8*  HCT 37.9* 35.7* 36.8*  MCV 85.9 86.0 89.5  MCH 28.8 29.9 28.7  MCHC 33.5 34.7 32.1  RDW 13.1 13.3 13.6  PLT 234 220 210    BNP Recent Labs  Lab 12/10/20 1732  BNP 26.7     DDimer No results for input(s): DDIMER in the last 168 hours.   Radiology    CARDIAC CATHETERIZATION  Result Date: 12/14/2020  No significant obstructive coronary disease is noted.  Diffuse three-vessel luminal irregularities.  Distal RCA intermediate stenosis from 2020 has resolved luminal irregularities.  PDA contains moderate diffuse disease without focal stenosis.  Low normal LV systolic function.  EF estimated to be 50%.  Angina is not due to obstructive CAD.  Consider microvascular dysfunction given poorly controlled diabetes and repeated coronary angiography for anginal quality chest pain. RECOMMENDATIONS:  Consider medication adjustment to add cardioprotection, ACE/ARB and SGLT2  therapy.  Consider discontinuation of beta-blocker therapy in case there is a component of microvascular spasm.   At some future point, consider invasive evaluation for microvascular dysfunction.   Cardiac Studies   Left heart cath 12/14/20:  No significant obstructive coronary disease is noted.  Diffuse three-vessel luminal irregularities.  Distal RCA intermediate stenosis from 2020 has resolved luminal irregularities.  PDA contains moderate diffuse disease without focal stenosis.  Low normal LV systolic function.  EF estimated to be 50%.  Angina is not due to obstructive CAD.  Consider microvascular dysfunction given poorly controlled diabetes and repeated coronary angiography for anginal quality chest pain.  RECOMMENDATIONS:   Consider medication adjustment to add cardioprotection, ACE/ARB and SGLT2 therapy.  Consider discontinuation of beta-blocker therapy in case there is a component of microvascular spasm.    At some future point, consider invasive evaluation for microvascular dysfunction.   Echocardiogram 12/11/2020: Impressions: 1. Left ventricular ejection fraction, by estimation, is 50 to 55%. The  left ventricle has low normal function. The left ventricle has no regional  wall motion abnormalities. Left ventricular diastolic parameters were  normal.  2. Right ventricular systolic function is normal. The right ventricular  size is normal.  3. The mitral valve is normal in structure. No evidence of mitral valve  regurgitation. No evidence of mitral stenosis.  4. The aortic valve is tricuspid. Aortic valve regurgitation is not  visualized. No aortic stenosis is present.  5. The inferior vena cava is normal in size with greater than 50%  respiratory variability, suggesting right atrial pressure of 3 mmHg.  Patient Profile     59 y.o. male with a history of non-obstructive CAD on cath in 10/2019, hypertension, hyperlipidemia, and uncontrolled type 2 diabetes  mellitus who was sent to the ED from our our office on 12/10/2020 due to concerns for unstable angina with plans for cardiac catheterization today.  Assessment & Plan    Chest pain - heart cath today showed nonobstructive CAD with no clear evidence for cardiac component to his chest pain other than possible microvascular disease - hs troponin x 3 negative - D/C BB - increase imdur, add ARB - focus on risk factor management, which for him will be primarily diabetes control - he stopped smoking 7 years ago   Multifocal PNA - ABX per TRH   Hypertension - increase imdur to 30 mg, start low  dose losartan - will D/C coreg due to possible microvascular spasm   Uncontrolled DM - A1c 13.4% - has not been on metformin for 5 years - on insulin at home - consider restarting metformin at discharge   Hyperlipidemia with LDL goal < 70 12/13/2020: Cholesterol 147; HDL 32; LDL Cholesterol 41; Triglycerides 370; VLDL 74 Continue 40 mg lipitor   Possible discharge today after TR band removal.   For questions or updates, please contact [facility] Please consult [email or web address] for contact info under        Signed, Marcelino Duster, PA  12/14/2020, 9:09 AM      Patient seen and examined. Agree with assessment and plan.  Cath data reviewed.  Right radial cath site stable, TR band still in place.  With resting heart rate in the 90s, favor continuing with very low-dose cardioselective beta-blocker and will transition to metoprolol succinate 12.5 mg rather than discontinuing this a beta-blocker treatment.  Medical therapy with plan to increase isosorbide to 30 mg and initiate low-dose losartan for improvement in endothelial function.  Plan to discharge today with follow-up with Dr. Servando Salina.   Lennette Bihari, MD, [facility] 12/14/2020 11:56 AM

## 2020-12-14 NOTE — CV Procedure (Addendum)
   Low normal LV function with EF 50%.  LVEDP 18 mmHg.  No obstructive coronary disease.  Improvement in the appearance of a distal RCA intermedius stenosis from 60% to minimal luminal irregularities.  No explanation for ongoing chest pressure unless he has microvascular angina.  Has very poorly controlled diabetes mellitus.  Consider ACE/ARB therapy (cardio and renal protective), SGLT2, and consider discontinuing beta-blocker therapy.

## 2020-12-14 NOTE — Progress Notes (Signed)
PROGRESS NOTE    Samuel AcheRobert W Wurtz  ZOX:[id number] DOB: August 12, 1961 DOA: 12/10/2020 PCP: Wilmer Floorampbell, Stephen D., MD   Brief Narrative:  Samuel Bailey is an 59 y.o. male with PMH of uncontrolled DM2, CAD, COPD (Gold 0 - 1), GERD, h/o CVA, h/o peptic ulcer disease, HTN, HLD who presented on 12/10/20 for progressive chest pain concerning for unstable angina.  He was admitted to the Cardiology for [facility]  During initial evaluation, CXR was done which was concerning for multifocal pneumonia.  Also A1C returned at 13.4 and thus [facility] was contacted for consultation.   [name] reports that he has been SOB for about 1 month.  The SOB comes on at any time, rest and exertion.  He notes that it is slowly getting worse and associated with central chest pain which is dull in nature.  He notes no cold symptoms, no rhinorrhea or sinus pressure.  He has not had any fever or chills.  He does note a cough for the same time period which produces only whitish phlegm and congested feeling in his lungs.  He has had exposure to his grandson who has had a cold.  Further symptoms include dizziness and lightheadedness upon standing for 1 month.  He is being followed by his Cardiologist Dr. Servando Salinaobb for volume overload and was on lasix for 5 days.  However, he notes that this "dried him out."  He lives in a mobile home that was recently renovated.  Air conditioning units are newer and are window units.  No one else living in the home is ill.  He has not had his COVID vaccine.  He did have a negative COVID and influenza PCR on 12/23.  He does have a history of COPD, but is only on reflumilast.  I reviewed PFTs from 2016 which showed only mild obstructive pattern.    As concerns his diabetes, he notes that he has been taking his insulin as prescribed, however, his blood sugars continue to run in the 300s at home.  He is in discussion with his PCP about getting an insulin pump to  help better control his blood sugars.   Assessment & Plan:   Principal Problem:   Unstable angina (HCC) Active Problems:   Uncontrolled type 2 diabetes with neuropathy (HCC)   HTN (hypertension)   GERD (gastroesophageal reflux disease)   Hyperlipidemia   COPD with chronic bronchitis (HCC)   Mild CAD   Multifocal pneumonia   Mild CAD Unstable angina .  S/p heart catheterization today which showed nonobstructive CAD.  Plan for medical management per cardiology.  History of COPD with chronic bronchitis now with multifocal pneumonia:  COPD with chronic bronchitis - Seen on xray as noted above. COVID and influenza negative.  Sick contact in grandson.  DDx includes CAP from a bacterial source vs. Viral pneumonia.  Urine antigen for streptococci is pending.  Still complains of shortness of breath but he is getting better every day.  He still has bilateral rhonchi but not hypoxic.  We will continue current antibiotics as long as he is here.  Uncontrolled type 2 diabetes with neuropathy - A1C is 13.4.  He notes prolonged history of uncontrolled DM. It appears [facility] On Lantus 70 units twice daily.  Pressure controlled.  Continue SSI.    HTN (hypertension) - Per primary team    GERD (gastroesophageal reflux disease) - Continue PPI  Hyperlipidemia: On a statin. - Per primary team  Final recommendations: It appears that cardiology plans to discharge this gentleman home.  If he were to discharge home, I would recommend discharging him on cefdinir 300 mg p.o. twice daily for 5 days for pneumonia and increasing his Lantus to 70 units twice daily instead of 60 units twice daily and resuming rest of the medications other than what cardiology is going to modify about his cardiac medications.  Would also recommend he sees his PCP within a week and repeat chest x-ray by that time.  DVT prophylaxis:    Code Status: Full Code  Family Communication:   None present at bedside.  Plan of care discussed with patient in length and he verbalized understanding and agreed with it.  Status is: Inpatient  Remains inpatient appropriate because:Ongoing diagnostic testing needed not appropriate for outpatient work up   Dispo: The patient is from: Home              Anticipated d/c is to: Per primary team              Anticipated d/c date is: Per primary team              Patient currently is not medically stable to d/c.        Estimated body mass index is 36.11 kg/m as calculated from the following:   Height as of this encounter:  (1.727 m).   Weight as of this encounter: 107.7 kg.      Nutritional status:               Consultants:   TRH  Procedures:   None  Antimicrobials:  Anti-infectives (From admission, onward)   Start     Dose/Rate Route Frequency Ordered Stop   12/12/20 1000  azithromycin (ZITHROMAX) tablet 250 mg       "Followed by" Linked Group Details   250 mg Oral Daily 12/11/20 1241 12/16/20 0959   12/11/20 1330  amoxicillin-clavulanate (AUGMENTIN) 875-125 MG per tablet 1 tablet        1 tablet Oral 2 times daily 12/11/20 1241 12/16/20 0959   12/11/20 1330  azithromycin (ZITHROMAX) tablet 500 mg       "Followed by" Linked Group Details   500 mg Oral Daily 12/11/20 1241 12/11/20 1524   12/10/20 1930  cefTRIAXone (ROCEPHIN) 1 g in sodium chloride 0.9 % 100 mL IVPB        1 g 200 mL/hr over 30 Minutes Intravenous  Once 12/10/20 1918 12/10/20 2120   12/10/20 1930  azithromycin (ZITHROMAX) 500 mg in sodium chloride 0.9 % 250 mL IVPB        500 mg 250 mL/hr over 60 Minutes Intravenous  Once 12/10/20 1918 12/10/20 2136         Subjective: Patient seen and examined after heart catheterization.  Sitting at the edge of the bed.  Looking comfortable but is still complains of shortness of breath however also states that he is feeling better.  No other complaint.  Not hypoxic. Objective: Vitals:    12/14/20 0803 12/14/20 0808 12/14/20 0812 12/14/20 0838  BP: 128/77 133/81 138/81   Pulse: 85 80 83 75  Resp: (!) 24 20 (!) 28 18  Temp:    97.9 F (36.6 C)  TempSrc:    Oral  SpO2: 95% 95% 95%   Weight:      Height:        Intake/Output Summary (Last 24 hours) at  12/14/2020 1043 Last data filed at 12/14/2020 0716 Gross per 24 hour  Intake 893.87 ml  Output 1 ml  Net 892.87 ml   Filed Weights   12/11/20 0511 12/12/20 0522 12/14/20 0500  Weight: 104.5 kg 104.2 kg 107.7 kg    Examination:  General exam: Appears calm and comfortable  Respiratory system: Rhonchi bilaterally in the middle lobes. Respiratory effort normal. Cardiovascular system: S1 & S2 heard, RRR. No JVD, murmurs, rubs, gallops or clicks. No pedal edema. Gastrointestinal system: Abdomen is nondistended, soft and nontender. No organomegaly or masses felt. Normal bowel sounds heard. Central nervous system: Alert and oriented. No focal neurological deficits. Extremities: Symmetric 5 x 5 power. Skin: No rashes, lesions or ulcers.  Psychiatry: Judgement and insight appear normal. Mood & affect appropriate.    Data Reviewed: I have personally reviewed following labs and imaging studies  CBC: Recent Labs  Lab 12/10/20 1732 12/11/20 0253 12/12/20 0144 12/13/20 0500 12/14/20 0308  WBC 8.5 6.5 6.0 6.0 6.6  NEUTROABS 4.4  --   --   --   --   HGB 14.0 13.0 12.7* 12.4* 11.8*  HCT 41.5 36.9* 37.9* 35.7* 36.8*  MCV 85.6 84.1 85.9 86.0 89.5  PLT 276 243 234 220 210   Basic Metabolic Panel: Recent Labs  Lab 12/10/20 1732 12/11/20 0253 12/13/20 0500 12/14/20 0308  NA 133* 135 138 140  K 3.7 3.7 3.8 3.9  CL 100 99 105 108  CO2 22 24 23  21*  GLUCOSE 338* 333* 163* 220*  BUN 7 8 7 7   CREATININE 0.78 0.88 0.86 0.94  CALCIUM 9.2 8.8* 8.4* 8.4*   GFR: Estimated Creatinine Clearance: 101.9 mL/min (by C-G formula based on SCr of 0.94 mg/dL). Liver Function Tests: Recent Labs  Lab 12/11/20 0253  AST 10*   ALT 11  ALKPHOS 57  BILITOT 0.6  PROT 5.6*  ALBUMIN 2.3*   No results for input(s): LIPASE, AMYLASE in the last 168 hours. No results for input(s): AMMONIA in the last 168 hours. Coagulation Profile: No results for input(s): INR, PROTIME in the last 168 hours. Cardiac Enzymes: No results for input(s): CKTOTAL, CKMB, CKMBINDEX, TROPONINI in the last 168 hours. BNP (last 3 results) No results for input(s): PROBNP in the last 8760 hours. HbA1C: No results for input(s): HGBA1C in the last 72 hours. CBG: Recent Labs  Lab 12/13/20 0712 12/13/20 1214 12/13/20 1712 12/13/20 2106 12/14/20 0716  GLUCAP 144* 140* 195* 233* 169*   Lipid Profile: Recent Labs    12/13/20 0500  CHOL 147  HDL 32*  LDLCALC 41  TRIG 12/16/20*  CHOLHDL 4.6   Thyroid Function Tests: No results for input(s): TSH, T4TOTAL, FREET4, T3FREE, THYROIDAB in the last 72 hours. Anemia Panel: No results for input(s): VITAMINB12, FOLATE, FERRITIN, TIBC, IRON, RETICCTPCT in the last 72 hours. Sepsis Labs: Recent Labs  Lab 12/12/20 1210  PROCALCITON <0.10    Recent Results (from the past 240 hour(s))  Resp Panel by RT-PCR (Flu A&B, Covid) Nasopharyngeal Swab     Status: None   Collection Time: 12/10/20  5:32 PM   Specimen: Nasopharyngeal Swab; Nasopharyngeal(NP) swabs in vial transport medium  Result Value Ref Range Status   SARS Coronavirus 2 by RT PCR NEGATIVE NEGATIVE Final    Comment: (NOTE) SARS-CoV-2 target nucleic acids are NOT DETECTED.  The SARS-CoV-2 RNA is generally detectable in upper respiratory specimens during the acute phase of infection. The lowest concentration of SARS-CoV-2 viral copies this assay can detect is 138 copies/mL.  A negative result does not preclude SARS-Cov-2 infection and should not be used as the sole basis for treatment or other patient management decisions. A negative result may occur with  improper specimen collection/handling, submission of specimen other than  nasopharyngeal swab, presence of viral mutation(s) within the areas targeted by this assay, and inadequate number of viral copies(<138 copies/mL). A negative result must be combined with clinical observations, patient history, and epidemiological information. The expected result is Negative.  Fact Sheet for Patients:  BloggerCourse.com  Fact Sheet for Healthcare Providers:  SeriousBroker.it  This test is no t yet approved or cleared by the [place] FDA and  has been authorized for detection and/or diagnosis of SARS-CoV-2 by FDA under an Emergency Use Authorization (EUA). This EUA will remain  in effect (meaning this test can be used) for the duration of the COVID-19 declaration under Section 564(b)(1) of the Act, 21 U.S.C.section 360bbb-3(b)(1), unless the authorization is terminated  or revoked sooner.       Influenza A by PCR NEGATIVE NEGATIVE Final   Influenza B by PCR NEGATIVE NEGATIVE Final    Comment: (NOTE) The Xpert Xpress SARS-CoV-2/FLU/RSV plus assay is intended as an aid in the diagnosis of influenza from Nasopharyngeal swab specimens and should not be used as a sole basis for treatment. Nasal washings and aspirates are unacceptable for Xpert Xpress SARS-CoV-2/FLU/RSV testing.  Fact Sheet for Patients: BloggerCourse.com  Fact Sheet for Healthcare Providers: SeriousBroker.it  This test is not yet approved or cleared by the [place] FDA and has been authorized for detection and/or diagnosis of SARS-CoV-2 by FDA under an Emergency Use Authorization (EUA). This EUA will remain in effect (meaning this test can be used) for the duration of the COVID-19 declaration under Section 564(b)(1) of the Act, 21 U.S.C. section 360bbb-3(b)(1), unless the authorization is terminated or revoked.  Performed at [facility], [address] [place], [place] [address]    Respiratory Panel by PCR     Status: None   Collection Time: 12/13/20 11:21 AM   Specimen: Nasopharyngeal Swab; Respiratory  Result Value Ref Range Status   Adenovirus NOT DETECTED NOT DETECTED Final   Coronavirus 229E NOT DETECTED NOT DETECTED Final    Comment: (NOTE) The Coronavirus on the Respiratory Panel, DOES NOT test for the novel  Coronavirus (2019 nCoV)    Coronavirus HKU1 NOT DETECTED NOT DETECTED Final   Coronavirus NL63 NOT DETECTED NOT DETECTED Final   Coronavirus OC43 NOT DETECTED NOT DETECTED Final   Metapneumovirus NOT DETECTED NOT DETECTED Final   Rhinovirus / Enterovirus NOT DETECTED NOT DETECTED Final   Influenza A NOT DETECTED NOT DETECTED Final   Influenza B NOT DETECTED NOT DETECTED Final   Parainfluenza Virus 1 NOT DETECTED NOT DETECTED Final   Parainfluenza Virus 2 NOT DETECTED NOT DETECTED Final   Parainfluenza Virus 3 NOT DETECTED NOT DETECTED Final   Parainfluenza Virus 4 NOT DETECTED NOT DETECTED Final   Respiratory Syncytial Virus NOT DETECTED NOT DETECTED Final   Bordetella pertussis NOT DETECTED NOT DETECTED Final   Bordetella Parapertussis NOT DETECTED NOT DETECTED Final   Chlamydophila pneumoniae NOT DETECTED NOT DETECTED Final   Mycoplasma pneumoniae NOT DETECTED NOT DETECTED Final    Comment: Performed at [facility], [address], [place], [place] [address]      Radiology Studies: CARDIAC CATHETERIZATION  Result Date: 12/14/2020  No significant obstructive coronary disease is noted.  Diffuse three-vessel luminal irregularities.  Distal RCA intermediate stenosis from 2020  has resolved luminal irregularities.  PDA contains moderate diffuse disease without focal stenosis.  Low normal LV systolic function.  EF estimated to be 50%.  Angina is not due to obstructive CAD.  Consider microvascular dysfunction given poorly controlled diabetes and repeated coronary angiography for anginal quality chest pain. RECOMMENDATIONS:  Consider  medication adjustment to add cardioprotection, ACE/ARB and SGLT2 therapy.  Consider discontinuation of beta-blocker therapy in case there is a component of microvascular spasm.   At some future point, consider invasive evaluation for microvascular dysfunction.   Scheduled Meds: . amoxicillin-clavulanate  1 tablet Oral BID  . aspirin EC  81 mg Oral Daily  . atorvastatin  40 mg Oral q1800  . azithromycin  250 mg Oral Daily  . ezetimibe  10 mg Oral Daily  . fenofibrate  54 mg Oral Daily  . gabapentin  300 mg Oral TID  . heparin  5,000 Units Subcutaneous Q8H  . insulin aspart  0-20 Units Subcutaneous TID WC  . insulin aspart  0-5 Units Subcutaneous QHS  . insulin aspart  4 Units Subcutaneous TID WC  . insulin glargine  70 Units Subcutaneous BID  . [START ON 12/15/2020] isosorbide mononitrate  30 mg Oral Daily  . losartan  12.5 mg Oral Daily  . pantoprazole  40 mg Oral BID AC  . ranolazine  500 mg Oral BID  . roflumilast  500 mcg Oral Daily  . sodium chloride flush  3 mL Intravenous Q12H  . umeclidinium bromide  1 puff Inhalation Daily   Continuous Infusions: . sodium chloride 125 mL/hr at 12/14/20 0842  . sodium chloride       LOS: 4 days   Time spent: 28 minutes   Hughie Closs, MD [facility]  12/14/2020, 10:43 AM   To contact the attending provider between 7A-7P or the covering provider during after hours 7P-7A, please log into the web site [email or web address].

## 2020-12-14 NOTE — Progress Notes (Signed)
ANTICOAGULATION CONSULT NOTE - Follow Up Consult  Pharmacy Consult for heparin Indication: chest pain/ACS  Labs: Recent Labs    12/12/20 0144 12/12/20 0849 12/13/20 0500 12/14/20 0308  HGB 12.7*  --  12.4* 11.8*  HCT 37.9*  --  35.7* 36.8*  PLT 234  --  220 210  HEPARINUNFRC 0.19* 0.34 0.45 0.27*  CREATININE  --   --  0.86 0.94    Assessment: 59yo male subtherapeutic on heparin after two levels at goal; no gtt issues or signs of bleeding per RN.  Not yet on cath schedule.  Goal of Therapy:  Heparin level 0.3-0.7 units/ml   Plan:  Will increase heparin gtt by 10% to 2600 units/hr and check level in 6 hours.    Vernard Gambles, PharmD, BCPS  12/14/2020,5:13 AM

## 2020-12-14 NOTE — Discharge Summary (Signed)
Discharge Summary    Patient ID: Samuel Bailey MRN: [id number]; DOB: 01/30/1961  Admit date: 12/10/2020 Discharge date: 12/14/2020  Primary Care Provider: Wilmer Floorampbell, Stephen D., MD  Primary Cardiologist: Thomasene RippleKardie Tobb, DO  Primary Electrophysiologist:  None   Discharge Diagnoses    Principal Problem:   Unstable angina [facility](HCC) Active Problems:   Uncontrolled type 2 diabetes with neuropathy (HCC)   HTN (hypertension)   GERD (gastroesophageal reflux disease)   Hyperlipidemia   COPD with chronic bronchitis (HCC)   Mild CAD   Multifocal pneumonia    Diagnostic Studies/Procedures    Left heart cath 12/14/20:  No significant obstructive coronary disease is noted. Diffuse three-vessel luminal irregularities.  Distal RCA intermediate stenosis from 2020 has resolved luminal irregularities. PDA contains moderate diffuse disease without focal stenosis.  Low normal LV systolic function. EF estimated to be 50%.  Angina is not due to obstructive CAD. Consider microvascular dysfunction given poorly controlled diabetes and repeated coronary angiography for anginal quality chest pain.  RECOMMENDATIONS:   Consider medication adjustment to add cardioprotection, ACE/ARB and SGLT2 therapy. Consider discontinuation of beta-blocker therapy in case there is a component of microvascular spasm.   At some future point, consider invasive evaluation for microvascular dysfunction.   Echocardiogram 12/11/2020: Impressions: 1. Left ventricular ejection fraction, by estimation, is 50 to 55%. The  left ventricle has low normal function. The left ventricle has no regional  wall motion abnormalities. Left ventricular diastolic parameters were  normal.  2. Right ventricular systolic function is normal. The right ventricular  size is normal.  3. The mitral valve is normal in structure. No evidence of mitral valve  regurgitation. No evidence of mitral stenosis.  4. The aortic valve is  tricuspid. Aortic valve regurgitation is not  visualized. No aortic stenosis is present.  5. The inferior vena cava is normal in size with greater than 50%  respiratory variability, suggesting right atrial pressure of 3 mmHg. _____________   History of Present Illness     Samuel AcheRobert W Stead is a 59 y.o. male with  with a history of non-obstructive CAD on cath in 10/2019, hypertension, hyperlipidemia, and uncontrolled type 2 diabetes mellitus who was sent to the ED from our our office on 12/10/2020 due to concerns for unstable angina.   [name] is a 59 y.o.malewith a hx of coronary artery disease recent cath in November 20 which showed nonobstructive coronary artery disease, hypertension, diabetes mellitus insulin requiring, diabetic neuropathy, history of CVA and hyperlipidemia presents today for follow-up visit.  -The patient was seen by Dr. Servando Salinaobb in July 2021 at that time he complained of intermittent chest pain given his mild CAD I placed him on Imdur, increased his atorvastatin to 40 mg and added Zetia 10 mg daily. - October 05, 2020 at that time he had significant bilateral leg edema I started the patient on course of Lasix. -The patient was seen again in early December and started on Ranexa, for worsening exertional and nonexertional chest pain associated with shortness of breath.   He was seen by Dr. Servando Salinaobb today and complained of progressively worsening chest pain even with minimal exertion and associated with shortness of breath as well as radiation of his pain to his jaw.  He is currently chest pain-free.  He states that he has been compliant with his medications.  [facility].  Hospital Course     Consultants: Internal Medicine  Chest pain Heart cath today showed  nonobstructive CAD with no clear evidence for a cardiac component to his chest pain other than possible microvascular disease. Given this, will D/C BB. Imdur  increased to 30 mg and low dose losartan was added. HS troponin x 3 negative. Focus on risk factor management, which for him will primarily be diabetes control. He stopped smoking 7 years ago.    Hypertension Medications as above. Titrate imdur and ARB as tolerated outpatient.    Hyperlipidemia with LDL goal < 70 12/13/2020: Cholesterol 147; HDL 32; LDL Cholesterol 41; Triglycerides 370; VLDL 74 Continue 40 mg lipitor.    DM - uncontrolled, with hyperglycemia A1c 13.4% He is on insulin at home, which [facility] has been helping titrate.  Will increase lantus fro 60 to 70 units BID. He states that he may be getting an insulin pump, so will hold off on restarting metformin (hasnt' taken in 5 years).   Former smoker Quit 7 years ago. Congratulated him on his continued success.    Multifocal PNA Was on zithromax and augmentin while inpatient. Will discharge on 300 mg cefdinir BID x 5 days as instructed by [facility].    Did the patient have an acute coronary syndrome (MI, NSTEMI, STEMI, etc) this admission?:  No                               Did the patient have a percutaneous coronary intervention (stent / angioplasty)?:  No.       _____________  Discharge Vitals Blood pressure 138/81, pulse 75, temperature 97.9 F (36.6 C), temperature source Oral, resp. rate 18, height 5\' 8"  (1.727 m), weight 107.7 kg, SpO2 95 %.  Filed Weights   12/11/20 0511 12/12/20 0522 12/14/20 0500  Weight: 104.5 kg 104.2 kg 107.7 kg    Labs & Radiologic Studies    CBC Recent Labs    12/13/20 0500 12/14/20 0308  WBC 6.0 6.6  HGB 12.4* 11.8*  HCT 35.7* 36.8*  MCV 86.0 89.5  PLT 220 210   Basic Metabolic Panel Recent Labs    12/16/20 0500 12/14/20 0308  NA 138 140  K 3.8 3.9  CL 105 108  CO2 23 21*  GLUCOSE 163* 220*  BUN 7 7  CREATININE 0.86 0.94  CALCIUM 8.4* 8.4*   Liver Function Tests No results for input(s): AST, ALT, ALKPHOS, BILITOT, PROT, ALBUMIN in the last 72 hours. No results  for input(s): LIPASE, AMYLASE in the last 72 hours. High Sensitivity Troponin:   Recent Labs  Lab 12/10/20 1732 12/10/20 1933 12/10/20 2306 12/11/20 0253  TROPONINIHS 6 7 7 7     BNP Invalid input(s): POCBNP D-Dimer No results for input(s): DDIMER in the last 72 hours. Hemoglobin A1C No results for input(s): HGBA1C in the last 72 hours. Fasting Lipid Panel Recent Labs    12/13/20 0500  CHOL 147  HDL 32*  LDLCALC 41  TRIG *  CHOLHDL 4.6   Thyroid Function Tests No results for input(s): TSH, T4TOTAL, T3FREE, THYROIDAB in the last 72 hours.  Invalid input(s): FREET3 _____________  CARDIAC CATHETERIZATION  Result Date: 12/14/2020  No significant obstructive coronary disease is noted.  Diffuse three-vessel luminal irregularities.  Distal RCA intermediate stenosis from 2020 has resolved luminal irregularities.  PDA contains moderate diffuse disease without focal stenosis.  Low normal LV systolic function.  EF estimated to be 50%.  Angina is not due to obstructive CAD.  Consider microvascular dysfunction given poorly controlled diabetes and repeated  coronary angiography for anginal quality chest pain. RECOMMENDATIONS:  Consider medication adjustment to add cardioprotection, ACE/ARB and SGLT2 therapy.  Consider discontinuation of beta-blocker therapy in case there is a component of microvascular spasm.   At some future point, consider invasive evaluation for microvascular dysfunction.  DG Chest Portable 1 View  Result Date: 12/10/2020 CLINICAL DATA:  Chest pain. EXAM: PORTABLE CHEST 1 VIEW COMPARISON:  Chest x-ray 10/29/2019 FINDINGS: The heart size and mediastinal contours are within normal limits. Interval development of peripheral patchy airspace opacities. No pulmonary edema. No pleural effusion. No pneumothorax. No acute osseous abnormality. Redemonstration of retained shrapnel noted overlying the left shoulder. Left humeral fixation with screws. IMPRESSION: Multifocal  pneumonia. COVID-19 infection not excluded. Followup PA and lateral chest X-ray is recommended in 3-4 weeks following therapy to ensure resolution and exclude underlying malignancy. Electronically Signed   By: Tish Frederickson M.D.   On: 12/10/2020 17:37   ECHOCARDIOGRAM COMPLETE  Result Date: 12/11/2020    ECHOCARDIOGRAM REPORT   Patient Name:   TATSUO MUSIAL Date of Exam: 12/11/2020 Medical Rec #:  [id number]     Height:       68.0 in Accession #:    [id number]    Weight:       230.3 lb Date of Birth:  03/01/1961     BSA:          2.170 m Patient Age:    59 years      BP:           126/84 mmHg Patient Gender: M             HR:           77 bpm. Exam Location:  Inpatient Procedure: 2D Echo, Color Doppler and Cardiac Doppler Indications:    Dyspnea R06.00                 Chest Pain R07.9  History:        Patient has prior history of Echocardiogram examinations, most                 recent 10/31/2019. CAD, COPD, Signs/Symptoms:Syncope; Risk                 Factors:Diabetes, Current Smoker, Hypertension and Dyslipidemia.  Sonographer:    Eulah Pont RDCS Referring Phys: [id number] RHONDA G BARRETT IMPRESSIONS  1. Left ventricular ejection fraction, by estimation, is 50 to 55%. The left ventricle has low normal function. The left ventricle has no regional wall motion abnormalities. Left ventricular diastolic parameters were normal.  2. Right ventricular systolic function is normal. The right ventricular size is normal.  3. The mitral valve is normal in structure. No evidence of mitral valve regurgitation. No evidence of mitral stenosis.  4. The aortic valve is tricuspid. Aortic valve regurgitation is not visualized. No aortic stenosis is present.  5. The inferior vena cava is normal in size with greater than 50% respiratory variability, suggesting right atrial pressure of 3 mmHg. FINDINGS  Left Ventricle: Left ventricular ejection fraction, by estimation, is 50 to 55%. The left ventricle has low normal function. The  left ventricle has no regional wall motion abnormalities. The left ventricular internal cavity size was normal in size. There is no left ventricular hypertrophy. Left ventricular diastolic parameters were normal. Right Ventricle: The right ventricular size is normal. No increase in right ventricular wall thickness. Right ventricular systolic function is normal. Left Atrium: Left atrial size was normal in size. Right Atrium:  Right atrial size was normal in size. Pericardium: There is no evidence of pericardial effusion. Mitral Valve: The mitral valve is normal in structure. No evidence of mitral valve regurgitation. No evidence of mitral valve stenosis. Tricuspid Valve: The tricuspid valve is normal in structure. Tricuspid valve regurgitation is not demonstrated. No evidence of tricuspid stenosis. Aortic Valve: The aortic valve is tricuspid. Aortic valve regurgitation is not visualized. No aortic stenosis is present. Aortic valve mean gradient measures 2.6 mmHg. Aortic valve peak gradient measures 5.4 mmHg. Aortic valve area, by VTI measures 3.66 cm. Pulmonic Valve: The pulmonic valve was not well visualized. Pulmonic valve regurgitation is not visualized. No evidence of pulmonic stenosis. Aorta: The aortic root is normal in size and structure. Pulmonary Artery: Indeterminant PASP, inadequate TR jet. Venous: The inferior vena cava is normal in size with greater than 50% respiratory variability, suggesting right atrial pressure of 3 mmHg. IAS/Shunts: No atrial level shunt detected by color flow Doppler.  LEFT VENTRICLE PLAX 2D LVIDd:         5.20 cm      Diastology LVIDs:         4.00 cm      LV e' medial:    8.08 cm/s LV PW:         0.70 cm      LV E/e' medial:  9.4 LV IVS:        0.80 cm      LV e' lateral:   10.30 cm/s LVOT diam:     2.30 cm      LV E/e' lateral: 7.3 LV SV:         76 LV SV Index:   35 LVOT Area:     4.15 cm  LV Volumes (MOD) LV vol d, MOD A2C: 96.7 ml LV vol d, MOD A4C: 109.0 ml LV vol s, MOD  A2C: 44.3 ml LV vol s, MOD A4C: 68.0 ml LV SV MOD A2C:     52.4 ml LV SV MOD A4C:     109.0 ml LV SV MOD BP:      49.8 ml RIGHT VENTRICLE RV S prime:     14.70 cm/s TAPSE (M-mode): 2.4 cm LEFT ATRIUM             Index       RIGHT ATRIUM           Index LA diam:        3.70 cm 1.70 cm/m  RA Area:     13.10 cm LA Vol (A2C):   53.8 ml 24.79 ml/m RA Volume:   26.90 ml  12.40 ml/m LA Vol (A4C):   53.6 ml 24.70 ml/m LA Biplane Vol: 54.6 ml 25.16 ml/m  AORTIC VALVE AV Area (Vmax):    3.79 cm AV Area (Vmean):   4.09 cm AV Area (VTI):     3.66 cm AV Vmax:           116.41 cm/s AV Vmean:          74.045 cm/s AV VTI:            0.209 m AV Peak Grad:      5.4 mmHg AV Mean Grad:      2.6 mmHg LVOT Vmax:         106.06 cm/s LVOT Vmean:        72.900 cm/s LVOT VTI:          0.183 m LVOT/AV VTI ratio: 0.88  AORTA Ao Root  diam: 3.50 cm Ao Asc diam:  3.20 cm MITRAL VALVE MV Area (PHT): 3.42 cm    SHUNTS MV Decel Time: 222 msec    Systemic VTI:  0.18 m MV E velocity: 75.60 cm/s  Systemic Diam: 2.30 cm MV A velocity: 75.60 cm/s MV E/A ratio:  1.00 Dina Rich MD Electronically signed by Dina Rich MD Signature Date/Time: 12/11/2020/11:02:16 AM    Final    Disposition   Pt is being discharged home today in good condition.  Follow-up Plans & Appointments     Follow-up Information    Tobb, Lavona Mound, DO Follow up on 12/28/2020.   Specialty: Cardiology Why: 10:20 am Contact information: [address] [place] [address] [phone]              Discharge Instructions    Diet - low sodium heart healthy   Complete by: As directed    Discharge instructions   Complete by: As directed    No driving for 2 days. No lifting over 5 lbs for 1 week. No sexual activity for 1 week. You may return to work in 1 week. Keep procedure site clean & dry. If you notice increased pain, swelling, bleeding or pus, call/return!  You may shower, but no soaking baths/hot tubs/pools for 1 week.   Increase activity  slowly   Complete by: As directed       Discharge Medications   Allergies as of 12/14/2020      Reactions   Lovastatin    diarrhea      Medication List    TAKE these medications   aspirin 81 MG EC tablet Take 1 tablet (81 mg total) by mouth daily.   atorvastatin 40 MG tablet Commonly known as: LIPITOR Take 1 tablet (40 mg total) by mouth daily.   cefdinir 300 MG capsule Commonly known as: OMNICEF Take 1 capsule (300 mg total) by mouth 2 (two) times daily.   Daliresp 500 MCG Tabs tablet Generic drug: roflumilast Take 500 mcg by mouth daily.   ezetimibe 10 MG tablet Commonly known as: ZETIA Take 1 tablet (10 mg total) by mouth daily.   Fenofibrate 150 MG Caps Take 1 capsule (150 mg total) by mouth daily.   furosemide 20 MG tablet Commonly known as: LASIX Take 1 tablet (20 mg total) by mouth 2 (two) times a week. Please take this on Tuesday and Saturday   gabapentin 300 MG capsule Commonly known as: NEURONTIN Take 300 mg by mouth 3 (three) times daily.   insulin aspart 100 UNIT/ML injection Commonly known as: novoLOG Inject 3-10 Units into the skin 3 (three) times daily with meals. Per sliding scale   insulin glargine 100 unit/mL Sopn Commonly known as: LANTUS Inject 70 Units into the skin 2 (two) times daily. What changed: how much to take   isosorbide mononitrate 30 MG 24 hr tablet Commonly known as: IMDUR Take 1 tablet (30 mg total) by mouth daily. What changed: how much to take   losartan 25 MG tablet Commonly known as: COZAAR Take 0.5 tablets (12.5 mg total) by mouth daily. Start taking on: December 15, 2020   metoprolol succinate 25 MG 24 hr tablet Commonly known as: TOPROL-XL Take 0.5 tablets (12.5 mg total) by mouth daily.   nitroGLYCERIN 0.4 MG SL tablet Commonly known as: NITROSTAT Place 1 tablet (0.4 mg total) under the tongue every 5 (five) minutes x 3 doses as needed for chest pain. What changed:   how much to take  when  to take  this   omeprazole 40 MG capsule Commonly known as: PRILOSEC Take 40 mg by mouth in the morning and at bedtime.   potassium chloride SA 20 MEQ tablet Commonly known as: KLOR-CON Take 1 tablet (20 mEq total) by mouth 2 (two) times daily.   ranolazine 500 MG 12 hr tablet Commonly known as: Ranexa Take 1 tablet (500 mg total) by mouth 2 (two) times daily.   tiotropium 18 MCG inhalation capsule Commonly known as: SPIRIVA Place 18 mcg into inhaler and inhale daily.          Outstanding Labs/Studies     Duration of Discharge Encounter   Greater than 30 minutes including physician time.  Signed, Roe Rutherford Ghada Abbett, PA 12/14/2020, 12:04 PM

## 2020-12-14 NOTE — Progress Notes (Signed)
Reported cbg of 169 to cath lab staff in procedure room.

## 2020-12-14 NOTE — Interval H&P Note (Signed)
Cath Lab Visit (complete for each Cath Lab visit)  Clinical Evaluation Leading to the Procedure:   ACS: No.  Non-ACS:    Anginal Classification: CCS III  Anti-ischemic medical therapy: Maximal Therapy (2 or more classes of medications)  Non-Invasive Test Results: Intermediate-risk stress test findings: cardiac mortality 1-3%/year  Prior CABG: No previous CABG      History and Physical Interval Note:  12/14/2020 7:34 AM  Samuel Bailey  has presented today for surgery, with the diagnosis of unstable angina.  The various methods of treatment have been discussed with the patient and family. After consideration of risks, benefits and other options for treatment, the patient has consented to  Procedure(s): LEFT HEART CATH AND CORONARY ANGIOGRAPHY (N/A) as a surgical intervention.  The patient's history has been reviewed, patient examined, no change in status, stable for surgery.  I have reviewed the patient's chart and labs.  Questions were answered to the patient's satisfaction.     Lyn Records III

## 2020-12-15 MED FILL — Heparin Sod (Porcine)-NaCl IV Soln 1000 Unit/500ML-0.9%: INTRAVENOUS | Qty: 500 | Status: AC

## 2020-12-24 DIAGNOSIS — I10 Essential (primary) hypertension: Secondary | ICD-10-CM | POA: Insufficient documentation

## 2020-12-28 ENCOUNTER — Encounter: Payer: Self-pay | Admitting: Cardiology

## 2020-12-28 ENCOUNTER — Other Ambulatory Visit: Payer: Self-pay

## 2020-12-28 ENCOUNTER — Ambulatory Visit (INDEPENDENT_AMBULATORY_CARE_PROVIDER_SITE_OTHER): Payer: Medicaid Other | Admitting: Cardiology

## 2020-12-28 VITALS — BP 142/72 | HR 98 | Ht 68.0 in | Wt 243.8 lb

## 2020-12-28 DIAGNOSIS — E1165 Type 2 diabetes mellitus with hyperglycemia: Secondary | ICD-10-CM

## 2020-12-28 DIAGNOSIS — J449 Chronic obstructive pulmonary disease, unspecified: Secondary | ICD-10-CM | POA: Diagnosis not present

## 2020-12-28 DIAGNOSIS — I25118 Atherosclerotic heart disease of native coronary artery with other forms of angina pectoris: Secondary | ICD-10-CM | POA: Diagnosis not present

## 2020-12-28 DIAGNOSIS — Z8673 Personal history of transient ischemic attack (TIA), and cerebral infarction without residual deficits: Secondary | ICD-10-CM | POA: Diagnosis not present

## 2020-12-28 DIAGNOSIS — I10 Essential (primary) hypertension: Secondary | ICD-10-CM | POA: Diagnosis not present

## 2020-12-28 DIAGNOSIS — R0600 Dyspnea, unspecified: Secondary | ICD-10-CM

## 2020-12-28 DIAGNOSIS — R0609 Other forms of dyspnea: Secondary | ICD-10-CM

## 2020-12-28 LAB — BASIC METABOLIC PANEL
BUN/Creatinine Ratio: 17 (ref 9–20)
BUN: 11 mg/dL (ref 6–24)
CO2: 17 mmol/L — ABNORMAL LOW (ref 20–29)
Calcium: 9.2 mg/dL (ref 8.7–10.2)
Chloride: 104 mmol/L (ref 96–106)
Creatinine, Ser: 0.64 mg/dL — ABNORMAL LOW (ref 0.76–1.27)
GFR calc Af Amer: 125 mL/min/{1.73_m2} (ref >59)
GFR calc non Af Amer: 108 mL/min/{1.73_m2} (ref >59)
Glucose: 205 mg/dL — ABNORMAL HIGH (ref 65–99)
Potassium: 4 mmol/L (ref 3.5–5.2)
Sodium: 138 mmol/L (ref 134–144)

## 2020-12-28 LAB — MAGNESIUM: Magnesium: 1.6 mg/dL (ref 1.6–2.3)
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
## 2020-12-28 NOTE — Patient Instructions (Signed)
Medication Instructions:  Your physician recommends that you continue on your current medications as directed. Please refer to the Current Medication list given to you today.  *If you need a refill on your cardiac medications before your next appointment, please call your pharmacy*   Lab Work: Your physician recommends that you return for lab work in: TODAY BMP, Mag If you have labs (blood work) drawn today and your tests are completely normal, you will receive your results only by: . MyChart Message (if you have MyChart) OR . A paper copy in the mail If you have any lab test that is abnormal or we need to change your treatment, we will call you to review the results.   Testing/Procedures: None   Follow-Up: At [facility], you and your health needs are our priority.  As part of our continuing mission to provide you with exceptional heart care, we have created designated Provider Care Teams.  These Care Teams include your primary Cardiologist (physician) and Advanced Practice Providers (APPs -  Physician Assistants and Nurse Practitioners) who all work together to provide you with the care you need, when you need it.  We recommend signing up for the patient portal called "MyChart".  Sign up information is provided on this After Visit Summary.  MyChart is used to connect with patients for Virtual Visits (Telemedicine).  Patients are able to view lab/test results, encounter notes, upcoming appointments, etc.  Non-urgent messages can be sent to your provider as well.   To learn more about what you can do with MyChart, go to [email or web address].    Your next appointment:   3 month(s)  The format for your next appointment:   In Person  Provider:   Kardie Tobb, DO   Other Instructions   

## 2020-12-29 ENCOUNTER — Telehealth: Payer: Self-pay

## 2020-12-29 NOTE — Telephone Encounter (Signed)
-----   Message from Thomasene Ripple, DO sent at 12/28/2020  5:48 PM EST ----- Labs stable.

## 2020-12-29 NOTE — Telephone Encounter (Signed)
Spoke with patient regarding results and recommendation.  Patient verbalizes understanding and is agreeable to plan of care. Advised patient to call back with any issues or concerns.  

## 2021-01-28 ENCOUNTER — Ambulatory Visit: Payer: Medicaid Other | Admitting: Podiatry

## 2021-02-01 ENCOUNTER — Ambulatory Visit (INDEPENDENT_AMBULATORY_CARE_PROVIDER_SITE_OTHER): Payer: Medicaid Other | Admitting: Podiatry

## 2021-02-01 ENCOUNTER — Ambulatory Visit (INDEPENDENT_AMBULATORY_CARE_PROVIDER_SITE_OTHER): Payer: Medicaid Other

## 2021-02-01 ENCOUNTER — Other Ambulatory Visit: Payer: Self-pay

## 2021-02-01 ENCOUNTER — Encounter: Payer: Self-pay | Admitting: Podiatry

## 2021-02-01 DIAGNOSIS — Z720 Tobacco use: Secondary | ICD-10-CM

## 2021-02-01 DIAGNOSIS — M14671 Charcot's joint, right ankle and foot: Secondary | ICD-10-CM

## 2021-02-01 DIAGNOSIS — E1165 Type 2 diabetes mellitus with hyperglycemia: Secondary | ICD-10-CM

## 2021-02-01 DIAGNOSIS — E1142 Type 2 diabetes mellitus with diabetic polyneuropathy: Secondary | ICD-10-CM

## 2021-02-01 DIAGNOSIS — S93324D Dislocation of tarsometatarsal joint of right foot, subsequent encounter: Secondary | ICD-10-CM | POA: Diagnosis not present

## 2021-02-01 DIAGNOSIS — R600 Localized edema: Secondary | ICD-10-CM

## 2021-02-01 NOTE — Progress Notes (Signed)
  Subjective:  Patient ID: Samuel Bailey, male    DOB: December 27, 1960,  MRN: [id number]  Chief Complaint  Patient presents with  . Foot Problem    My right foot is killing me and I was in the hospital from 12-11-2020 to 12-13-2020 with pneumonia and then I had a heart cath done    60 y.o. male presents for f/u of Charcot foot. Hx as above confirmed with patient.  Objective:  Physical Exam: warm, good capillary refill, no trophic changes or ulcerative lesions, normal DP and PT pulses and normal sensory exam.  Right Foot: No active warmth, erythema or edema to the right foot. Stable deformity at the medial arch; deformity medially with obvious bone dislocation, pain to palpation but no crepitus at the mid tarsal joints. Rocker bottom deformity.  Assessment:   1. Charcot's joint of right foot   2. Lisfranc dislocation, right, subsequent encounter   3. Localized edema   4. Diabetic polyneuropathy associated with type 2 diabetes mellitus (HCC)   5. Poorly controlled diabetes mellitus (HCC)   6. Tobacco abuse    Plan:  Patient was evaluated and treated and all questions answered.  Charcot foot right foot -Appears stable and coalesced. Significant midfoot deformity with displaced medial cuneiform. -Recently underwent hospitalization 12-11-20 to 12-13-20 for pneumonia and underwent heart cath. He continues to smoke. He remains at high risk for surgery. The deformity does appear stable. Will plan for CROW walker at this time. Defer surgical management until patient condition improves. He would need to have adequate glycemic control and non-smoking status in order to consider surgery.  No follow-ups on file.

## 2021-03-04 ENCOUNTER — Other Ambulatory Visit: Payer: Self-pay

## 2021-03-04 ENCOUNTER — Ambulatory Visit (INDEPENDENT_AMBULATORY_CARE_PROVIDER_SITE_OTHER): Payer: Medicaid Other | Admitting: Podiatry

## 2021-03-04 DIAGNOSIS — S93324D Dislocation of tarsometatarsal joint of right foot, subsequent encounter: Secondary | ICD-10-CM

## 2021-03-04 DIAGNOSIS — R600 Localized edema: Secondary | ICD-10-CM | POA: Diagnosis not present

## 2021-03-04 DIAGNOSIS — M14671 Charcot's joint, right ankle and foot: Secondary | ICD-10-CM | POA: Diagnosis not present

## 2021-03-04 NOTE — Progress Notes (Signed)
  Subjective:  Patient ID: Samuel Bailey, male    DOB: 12/19/1961,  MRN: [id number]  Chief Complaint  Patient presents with  . Follow-up    Pt states he is still having pain mentioned he in unable to walk for long distance- tried to keep foot elevated for minor comfort    60 y.o. male presents for f/u of Charcot foot. Hx as above confirmed with patient.  Objective:  Physical Exam: warm, good capillary refill, no trophic changes or ulcerative lesions, normal DP and PT pulses and normal sensory exam.  Right Foot: No active warmth, erythema or edema to the right foot. Stable deformity at the medial arch; deformity medially with obvious bone dislocation, pain to palpation but no crepitus at the mid tarsal joints. Rocker bottom deformity.  Assessment:   1. Charcot's joint of right foot   2. Lisfranc dislocation, right, subsequent encounter   3. Localized edema    Plan:  Patient was evaluated and treated and all questions answered.  Charcot foot right foot -Appears stable and coalesced. Significant midfoot deformity with displaced medial cuneiform. -Recently underwent hospitalization 12-11-20 to 12-13-20 for pneumonia and underwent heart cath. He remains at high risk for surgery. The deformity does appear stable. Will plan for CROW walker at this time. New Rx written for pt. -His A1c is markedly improved, now 7.1. He states he has been smoke free for 6 years. Will still need to wait from cardiac standpoint and will need Cardiac clearance prior to surgery if CROW boot fails.  Return in about 6 weeks (around 04/15/2021) for Charcot F/u.

## 2021-03-28 ENCOUNTER — Other Ambulatory Visit: Payer: Self-pay | Admitting: Cardiology

## 2021-03-29 ENCOUNTER — Other Ambulatory Visit: Payer: Self-pay

## 2021-03-29 ENCOUNTER — Ambulatory Visit (INDEPENDENT_AMBULATORY_CARE_PROVIDER_SITE_OTHER): Payer: Medicaid Other | Admitting: Cardiology

## 2021-03-29 ENCOUNTER — Encounter: Payer: Self-pay | Admitting: Cardiology

## 2021-03-29 VITALS — BP 132/76 | HR 95 | Ht 68.0 in | Wt 254.8 lb

## 2021-03-29 DIAGNOSIS — I10 Essential (primary) hypertension: Secondary | ICD-10-CM

## 2021-03-29 DIAGNOSIS — I2510 Atherosclerotic heart disease of native coronary artery without angina pectoris: Secondary | ICD-10-CM | POA: Diagnosis not present

## 2021-03-29 DIAGNOSIS — I25118 Atherosclerotic heart disease of native coronary artery with other forms of angina pectoris: Secondary | ICD-10-CM

## 2021-03-29 DIAGNOSIS — E782 Mixed hyperlipidemia: Secondary | ICD-10-CM

## 2021-03-29 DIAGNOSIS — R0602 Shortness of breath: Secondary | ICD-10-CM | POA: Diagnosis not present

## 2021-03-29 DIAGNOSIS — F17200 Nicotine dependence, unspecified, uncomplicated: Secondary | ICD-10-CM

## 2021-03-29 DIAGNOSIS — E669 Obesity, unspecified: Secondary | ICD-10-CM

## 2021-03-29 DIAGNOSIS — E1165 Type 2 diabetes mellitus with hyperglycemia: Secondary | ICD-10-CM

## 2021-03-29 LAB — BASIC METABOLIC PANEL
BUN/Creatinine Ratio: 16 (ref 9–20)
BUN: 14 mg/dL (ref 6–24)
CO2: 21 mmol/L (ref 20–29)
Calcium: 8.8 mg/dL (ref 8.7–10.2)
Chloride: 97 mmol/L (ref 96–106)
Creatinine, Ser: 0.85 mg/dL (ref 0.76–1.27)
Glucose: 269 mg/dL — ABNORMAL HIGH (ref 65–99)
Potassium: 4.7 mmol/L (ref 3.5–5.2)
Sodium: 136 mmol/L (ref 134–144)
eGFR: 100 mL/min/{1.73_m2} (ref >59)

## 2021-03-29 LAB — MAGNESIUM: Magnesium: 2 mg/dL (ref 1.6–2.3)

## 2021-03-29 MED ORDER — ASPIRIN 81 MG PO TBEC: 81.0000 mg | DELAYED_RELEASE_TABLET | Freq: Every day | ORAL | Status: AC

## 2021-03-29 MED ORDER — NITROGLYCERIN 0.4 MG SL SUBL: SUBLINGUAL_TABLET | SUBLINGUAL | 3 refills | Status: DC | PRN

## 2021-03-29 MED ORDER — POTASSIUM CHLORIDE CRYS ER 20 MEQ PO TBCR: 20.0000 meq | EXTENDED_RELEASE_TABLET | Freq: Two times a day (BID) | ORAL | 3 refills | Status: DC

## 2021-03-29 MED ORDER — ATORVASTATIN CALCIUM 40 MG PO TABS: 40.0000 mg | ORAL_TABLET | Freq: Every day | ORAL | 3 refills | Status: AC

## 2021-03-29 MED ORDER — METOPROLOL SUCCINATE ER 25 MG PO TB24: ORAL_TABLET | Freq: Every day | ORAL | 3 refills | Status: DC

## 2021-03-29 MED ORDER — EZETIMIBE 10 MG PO TABS: 10.0000 mg | ORAL_TABLET | Freq: Every day | ORAL | 3 refills | Status: DC

## 2021-03-29 MED ORDER — LOSARTAN POTASSIUM 25 MG PO TABS: ORAL_TABLET | Freq: Every day | ORAL | 3 refills | Status: DC

## 2021-03-29 MED ORDER — ISOSORBIDE MONONITRATE ER 30 MG PO TB24: 30.0000 mg | ORAL_TABLET | Freq: Every day | ORAL | 3 refills | Status: DC

## 2021-03-29 MED ORDER — FUROSEMIDE 20 MG PO TABS: 20.0000 mg | ORAL_TABLET | ORAL | 3 refills | Status: DC

## 2021-03-29 NOTE — Addendum Note (Signed)
Addended by: Reynolds Bowl on: 03/29/2021 09:20 AM   Modules accepted: Orders

## 2021-03-29 NOTE — Patient Instructions (Signed)
Medication Instructions:  Your physician recommends that you continue on your current medications as directed. Please refer to the Current Medication list given to you today. Refills sent to pharmacy *If you need a refill on your cardiac medications before your next appointment, please call your pharmacy*   Lab Work: Your physician recommends that you return for lab work in: TODAY: BMET, Mag If you have labs (blood work) drawn today and your tests are completely normal, you will receive your results only by: Marland Kitchen MyChart Message (if you have MyChart) OR . A paper copy in the mail If you have any lab test that is abnormal or we need to change your treatment, we will call you to review the results.   Testing/Procedures: None   Follow-Up: At [facility], you and your health needs are our priority.  As part of our continuing mission to provide you with exceptional heart care, we have created designated Provider Care Teams.  These Care Teams include your primary Cardiologist (physician) and Advanced Practice Providers (APPs -  Physician Assistants and Nurse Practitioners) who all work together to provide you with the care you need, when you need it.  We recommend signing up for the patient portal called "MyChart".  Sign up information is provided on this After Visit Summary.  MyChart is used to connect with patients for Virtual Visits (Telemedicine).  Patients are able to view lab/test results, encounter notes, upcoming appointments, etc.  Non-urgent messages can be sent to your provider as well.   To learn more about what you can do with MyChart, go to [email or web address].    Your next appointment:   6 month(s)  The format for your next appointment:   In Person  Provider:   Thomasene Ripple, DO   Other Instruction Please bring your medications to your next appointment.

## 2021-03-29 NOTE — Telephone Encounter (Signed)
Ranolazine approved and sent 
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
## 2021-04-15 ENCOUNTER — Ambulatory Visit (INDEPENDENT_AMBULATORY_CARE_PROVIDER_SITE_OTHER): Payer: Medicaid Other | Admitting: Podiatry

## 2021-04-15 DIAGNOSIS — Z5329 Procedure and treatment not carried out because of patient's decision for other reasons: Secondary | ICD-10-CM

## 2021-04-15 NOTE — Progress Notes (Signed)
   Complete physical exam  Patient: Samuel Bailey   DOB: 10/08/1998   60 y.o. Male  MRN: [id number]  Subjective:    No chief complaint on file.   Samuel Bailey is a 60 y.o. male who presents today for a complete physical exam. She reports consuming a {diet types:17450} diet. {types:19826} She generally feels {DESC; WELL/FAIRLY WELL/POORLY:18703}. She reports sleeping {DESC; WELL/FAIRLY WELL/POORLY:18703}. She {does/does not:200015} have additional problems to discuss today.    Most recent fall risk assessment:    06/15/2022   10:42 AM  Fall Risk   Falls in the past year? 0  Number falls in past yr: 0  Injury with Fall? 0  Risk for fall due to : No Fall Risks  Follow up Falls evaluation completed     Most recent depression screenings:    06/15/2022   10:42 AM 05/06/2021   10:46 AM  PHQ 2/9 Scores  PHQ - 2 Score 0 0  PHQ- 9 Score 5     {VISON DENTAL STD PSA (Optional):27386}  {History (Optional):23778}  Patient Care Team: Jessup, Joy, NP as PCP - General (Nurse Practitioner)   Outpatient Medications Prior to Visit  Medication Sig   fluticasone (FLONASE) 50 MCG/ACT nasal spray Place 2 sprays into both nostrils in the morning and at bedtime. After 7 days, reduce to once daily.   norgestimate-ethinyl estradiol (SPRINTEC 28) 0.25-35 MG-MCG tablet Take 1 tablet by mouth daily.   Nystatin POWD Apply liberally to affected area 2 times per day   spironolactone (ALDACTONE) 100 MG tablet Take 1 tablet (100 mg total) by mouth daily.   No facility-administered medications prior to visit.    ROS        Objective:     There were no vitals taken for this visit. {Vitals History (Optional):23777}  Physical Exam   No results found for any visits on 07/21/22. {Show previous labs (optional):23779}    Assessment & Plan:    Routine Health Maintenance and Physical Exam  Immunization History  Administered Date(s) Administered   DTaP 12/22/1999, 02/17/2000,  04/27/2000, 01/11/2001, 07/27/2004   Hepatitis A 05/23/2008, 05/29/2009   Hepatitis B 10/09/1999, 11/16/1999, 04/27/2000   HiB (PRP-OMP) 12/22/1999, 02/17/2000, 04/27/2000, 01/11/2001   IPV 12/22/1999, 02/17/2000, 10/16/2000, 07/27/2004   Influenza,inj,Quad PF,6+ Mos 08/29/2014   Influenza-Unspecified 11/28/2012   MMR 10/16/2001, 07/27/2004   Meningococcal Polysaccharide 05/28/2012   Pneumococcal Conjugate-13 01/11/2001   Pneumococcal-Unspecified 04/27/2000, 07/11/2000   Tdap 05/28/2012   Varicella 10/16/2000, 05/23/2008    Health Maintenance  Topic Date Due   HIV Screening  Never done   Hepatitis C Screening  Never done   INFLUENZA VACCINE  07/19/2022   PAP-Cervical Cytology Screening  07/21/2022 (Originally 10/07/2020)   PAP SMEAR-Modifier  07/21/2022 (Originally 10/07/2020)   TETANUS/TDAP  07/21/2022 (Originally 05/28/2022)   HPV VACCINES  Discontinued   COVID-19 Vaccine  Discontinued    Discussed health benefits of physical activity, and encouraged her to engage in regular exercise appropriate for her age and condition.  Problem List Items Addressed This Visit   None Visit Diagnoses     Annual physical exam    -  Primary   Cervical cancer screening       Need for Tdap vaccination          No follow-ups on file.     Joy Jessup, NP   

## 2021-04-20 ENCOUNTER — Encounter: Payer: Self-pay | Admitting: Internal Medicine

## 2021-05-10 ENCOUNTER — Other Ambulatory Visit: Payer: Self-pay | Admitting: Podiatry

## 2021-05-10 ENCOUNTER — Telehealth: Payer: Self-pay

## 2021-05-10 ENCOUNTER — Ambulatory Visit: Payer: Medicaid Other | Admitting: Podiatry

## 2021-05-10 ENCOUNTER — Other Ambulatory Visit: Payer: Self-pay

## 2021-05-10 ENCOUNTER — Ambulatory Visit (INDEPENDENT_AMBULATORY_CARE_PROVIDER_SITE_OTHER): Payer: Medicaid Other

## 2021-05-10 DIAGNOSIS — M14671 Charcot's joint, right ankle and foot: Secondary | ICD-10-CM

## 2021-05-10 DIAGNOSIS — I83029 Varicose veins of left lower extremity with ulcer of unspecified site: Secondary | ICD-10-CM | POA: Diagnosis not present

## 2021-05-10 DIAGNOSIS — L97929 Non-pressure chronic ulcer of unspecified part of left lower leg with unspecified severity: Secondary | ICD-10-CM

## 2021-05-10 DIAGNOSIS — R609 Edema, unspecified: Secondary | ICD-10-CM

## 2021-05-10 DIAGNOSIS — I83892 Varicose veins of left lower extremities with other complications: Secondary | ICD-10-CM

## 2021-05-10 NOTE — Telephone Encounter (Signed)
Called [facility] to provide wound care orders, they stated to fax Rx orders to [phone]. Rx order was faxed.

## 2021-05-10 NOTE — Progress Notes (Signed)
  Subjective:  Patient ID: Samuel Bailey, male    DOB: Dec 08, 1961,  MRN: [id number]  Chief Complaint  Patient presents with  . Ulcer    Ulcers at Lt medial ankle and lateral leg x 2 mo; no injury -w/ redness,s welling and drainage and warmth -w/ nausea - no V/F/Ch Tx: dry dressing by home care    60 y.o. male presents for wound care. Hx confirmed with patient.  Objective:  Physical Exam: Wound Location: left gaiter region ankle Wound Measurement: 6*4 Wound Base: Mixed Granular/Fibrotic Peri-wound: Normal Exudate: Moderate amount Serosanguinous exudate wound without warmth, erythema, signs of acute infection  Wound Location: left lateral leg Wound Measurement: 2x2 Wound Base: Granular/Healthy Peri-wound: Normal Exudate: Scant/small amount Serosanguinous exudate wound without warmth, erythema, signs of acute infection   Right Foot: No active warmth, erythema or edema to the right foot. Stable deformity at the medial arch; deformity medially with obvious bone dislocation, pain to palpation but no crepitus at the mid tarsal joints. Rocker bottom deformity. No images are attached to the encounter.  Radiographs:  X-ray of the left ankle: no soft tissue emphysema, no signs of osteomyelitis Assessment:   1. Venous stasis ulcer of left lower leg with edema of left lower leg (HCC)    Plan:  Patient was evaluated and treated and all questions answered.  Ulcer left leg -Likely VLU etiology. Educated patient on cause. -Cleansed wounds, mild mechanical debridement with wet gauze -Medihoney applied to wound base. -Multilayer compression applied today left -HHC to change Unna boot weekly  Charcot foot right foot -Stable, not causing pain at present, no ulceration  Return in about 2 weeks (around 05/24/2021) for Wound Care, Unna boot change.

## 2021-05-11 ENCOUNTER — Telehealth: Payer: Self-pay | Admitting: Cardiology

## 2021-05-11 NOTE — Telephone Encounter (Signed)
  Renee with [facility] calling, she is requesting to get a copy of pt's last OV notes. She gave fax# [phone]

## 2021-05-12 NOTE — Telephone Encounter (Signed)
Last office notes faxed

## 2021-05-12 NOTE — Telephone Encounter (Signed)
Faxed

## 2021-05-24 ENCOUNTER — Institutional Professional Consult (permissible substitution): Payer: Medicaid Other | Admitting: Internal Medicine

## 2021-05-27 ENCOUNTER — Other Ambulatory Visit: Payer: Self-pay

## 2021-05-27 ENCOUNTER — Ambulatory Visit: Payer: Medicaid Other | Admitting: Podiatry

## 2021-05-27 ENCOUNTER — Encounter: Payer: Self-pay | Admitting: Podiatry

## 2021-05-27 VITALS — Temp 98.0°F

## 2021-05-27 DIAGNOSIS — R609 Edema, unspecified: Secondary | ICD-10-CM | POA: Diagnosis not present

## 2021-05-27 DIAGNOSIS — L97929 Non-pressure chronic ulcer of unspecified part of left lower leg with unspecified severity: Secondary | ICD-10-CM | POA: Diagnosis not present

## 2021-05-27 DIAGNOSIS — I83029 Varicose veins of left lower extremity with ulcer of unspecified site: Secondary | ICD-10-CM | POA: Diagnosis not present

## 2021-05-27 DIAGNOSIS — I83892 Varicose veins of left lower extremities with other complications: Secondary | ICD-10-CM

## 2021-06-01 ENCOUNTER — Other Ambulatory Visit: Payer: Self-pay | Admitting: Sports Medicine

## 2021-06-01 ENCOUNTER — Telehealth: Payer: Self-pay

## 2021-06-01 MED ORDER — SULFAMETHOXAZOLE-TRIMETHOPRIM 400-80 MG PO TABS: 1.0000 | ORAL_TABLET | Freq: Two times a day (BID) | ORAL | 0 refills | Status: DC

## 2021-06-01 NOTE — Telephone Encounter (Signed)
Jodi Mourning from [facility] called requesting wound care orders for pt. Jodi Mourning states she did applied medihoney, maxsorb and unna boot at the last visit, and pt's wound was very tender to the touch and painful. Jodi Mourning also suggested pt might need a Rx for abx since the wound is not looking good and if we can get the referral to vein and vascular sooner. Please advice

## 2021-06-01 NOTE — Progress Notes (Signed)
Sent Bactrim for worsening wound and advised homecare if no better should go to ER.

## 2021-06-02 NOTE — Telephone Encounter (Signed)
Pt was advised of abx and Dr. Wynema Birch recommendation to go to the ER if worsening

## 2021-06-10 ENCOUNTER — Ambulatory Visit: Payer: Medicaid Other | Admitting: Podiatry

## 2021-06-10 ENCOUNTER — Other Ambulatory Visit: Payer: Self-pay

## 2021-06-10 DIAGNOSIS — R609 Edema, unspecified: Secondary | ICD-10-CM

## 2021-06-10 DIAGNOSIS — I83029 Varicose veins of left lower extremity with ulcer of unspecified site: Secondary | ICD-10-CM

## 2021-06-10 DIAGNOSIS — I83892 Varicose veins of left lower extremities with other complications: Secondary | ICD-10-CM

## 2021-06-10 DIAGNOSIS — L97929 Non-pressure chronic ulcer of unspecified part of left lower leg with unspecified severity: Secondary | ICD-10-CM

## 2021-06-10 NOTE — Progress Notes (Signed)
  Subjective:  Patient ID: Samuel Bailey, male    DOB: 06/29/1961,  MRN: [id number]  No chief complaint on file.   60 y.o. male presents for wound care. States the left leg kis hurting a lot but better than before. On Bactrim. Trying to stay off of it. States the nurse comes and changes the dressing for him. Objective:  Physical Exam: Wound Location: left gaiter region ankle Wound Measurement: 1x1 Wound Base: Mixed Granular/Fibrotic Peri-wound: Normal Exudate: Moderate amount Serosanguinous exudate wound without warmth, erythema, signs of acute infection  Wound Location: left proximal leg Wound Measurement: 2x2 Wound Base: Granular/Healthy Peri-wound: Normal Exudate: Scant/small amount Serosanguinous exudate wound without warmth, erythema, signs of acute infection   Right Foot: No active warmth, erythema or edema to the right foot. Stable deformity at the medial arch; deformity medially with obvious bone dislocation, pain to palpation but no crepitus at the mid tarsal joints. Rocker bottom deformity.  Assessment:   1. Venous stasis ulcer of left lower leg with edema of left lower leg (HCC)     Plan:  Patient was evaluated and treated and all questions answered.  Ulcer left leg -Improving. Cleansed and mechanically debrided today -Dressed with medihoney and multilayer compression dressing  Procedure: Multilayer Compression dressing Rationale: venous insufficiency Technique: Unna boot, cast padding, Coban compression dressing applied Disposition: Patient tolerated procedure well.    Return in about 2 weeks (around 06/24/2021) for Wound Care.

## 2021-06-17 NOTE — Progress Notes (Signed)
  Subjective:  Patient ID: Samuel Bailey, male    DOB: 1961/10/13,  MRN: [id number]  Chief Complaint  Patient presents with   Foot Ulcer    The nurse came out Monday and rewrapped the left leg and I have a new blister on the front of the left leg that is killing me    60 y.o. male presents for wound care. Hx confirmed with patient.  Objective:  Physical Exam: Wound Location: left gaiter region ankle Wound Measurement: 3x2.5 Wound Base: Mixed Granular/Fibrotic Peri-wound: Normal Exudate: Moderate amount Serosanguinous exudate wound without warmth, erythema, signs of acute infection  Wound Location: left lateral leg Wound Measurement: 1x1 Wound Base: Granular/Healthy Peri-wound: Normal Exudate: Scant/small amount Serosanguinous exudate wound without warmth, erythema, signs of acute infection  Right Foot: No active warmth, erythema or edema to the right foot. Stable deformity at the medial arch; deformity medially with obvious bone dislocation, pain to palpation but no crepitus at the mid tarsal joints. Rocker bottom deformity. No images are attached to the encounter.  Assessment:   1. Venous stasis ulcer of left lower leg with edema of left lower leg (HCC)    Plan:  Patient was evaluated and treated and all questions answered.  Ulcer left leg -Improving -Cleansed wounds. Reapply [employer].  Procedure: Multilayer Compression dressing Rationale: venous insufficiency Technique: Unna boot, cast padding, Coban compression dressing applied Disposition: Patient tolerated procedure well.    Charcot foot right foot -Stable, not causing pain at present, no ulceration  No follow-ups on file.

## 2021-06-28 ENCOUNTER — Ambulatory Visit: Payer: Medicaid Other | Admitting: Podiatry

## 2021-06-28 ENCOUNTER — Other Ambulatory Visit: Payer: Self-pay | Admitting: Podiatry

## 2021-06-28 ENCOUNTER — Other Ambulatory Visit: Payer: Self-pay

## 2021-06-28 ENCOUNTER — Ambulatory Visit (INDEPENDENT_AMBULATORY_CARE_PROVIDER_SITE_OTHER): Payer: Medicaid Other

## 2021-06-28 DIAGNOSIS — I83892 Varicose veins of left lower extremities with other complications: Secondary | ICD-10-CM

## 2021-06-28 DIAGNOSIS — R600 Localized edema: Secondary | ICD-10-CM

## 2021-06-28 DIAGNOSIS — I83029 Varicose veins of left lower extremity with ulcer of unspecified site: Secondary | ICD-10-CM | POA: Diagnosis not present

## 2021-06-28 DIAGNOSIS — R609 Edema, unspecified: Secondary | ICD-10-CM

## 2021-06-28 DIAGNOSIS — M14672 Charcot's joint, left ankle and foot: Secondary | ICD-10-CM

## 2021-06-28 DIAGNOSIS — L97929 Non-pressure chronic ulcer of unspecified part of left lower leg with unspecified severity: Secondary | ICD-10-CM

## 2021-06-28 NOTE — Progress Notes (Signed)
  Subjective:  Patient ID: Samuel Bailey, male    DOB: 20-Feb-1961,  MRN: [id number]  Chief Complaint  Patient presents with   Wound Check    F/U Lt wound check -pt states," looking a whole lot better than what it was." -8/10 shapr pain -dressing by home care with medihoney and unna boot   60 y.o. male presents for wound care. New complaint of foot swelling and pain, thinks the left foot is doing what the right foot did. Objective:  Physical Exam: Wound Location: left gaiter region ankle Wound Measurement: healed Wound Base: Mixed Granular/Fibrotic Peri-wound: Normal Exudate: Moderate amount Serosanguinous exudate wound without warmth, erythema, signs of acute infection  Wound Location: left proximal leg Wound Measurement: 1.5*1, 0.5x0.5 Wound Base: Granular/Healthy Peri-wound: Normal Exudate: Scant/small amount Serosanguinous exudate wound without warmth, erythema, signs of acute infection  Right Foot: No active warmth, erythema or edema to the right foot. Stable deformity at the medial arch; deformity medially with obvious bone dislocation, pain to palpation but no crepitus at the mid tarsal joints. Rocker bottom deformity.   Left Foot: midfoot warmth, POP, edema, visible midfoot deformity. Assessment:   1. Venous stasis ulcer of left lower leg with edema of left lower leg (HCC)   2. Localized edema     Plan:  Patient was evaluated and treated and all questions answered.  Ulcer left leg -Continues to improve. Cleansed, dressed with unna boot today  Procedure: Multilayer Compression dressing Rationale: venous insufficiency Technique: Unna boot, cast padding, Coban compression dressing applied Disposition: Patient tolerated procedure well.   Charcot foot left -Educated on etiology -XR reviewed with patient. -Discussed importance of NWB to prevent rocker bottom deformity left. -Pt states he still has boot, he is to use this to protect and prevent pressure.  -Discussed  sequela of putting pressure   Return in about 2 weeks (around 07/12/2021) for Wound Care, charccot f/u with XRs.

## 2021-07-06 ENCOUNTER — Telehealth: Payer: Self-pay

## 2021-07-06 NOTE — Telephone Encounter (Signed)
Jasmine December from [facility] called and stated pt has developed a new blister at the back of his Lt ankle and has a red bruising all the way through the heel of his Lt foot. Jasmine December states this condition may be probably form his cam boot rubbing on his foot,b ut the blister seems intact.  Jasmine December states blister measures 2.5 x 2.5 fluid filled blister  Jasmine December states she continued with the same dressing with a heel foam pad at the last visit. Please advised of any wound changes, recommendations or if pt needs to be seen sooner than his appt date on 7/25.

## 2021-07-06 NOTE — Telephone Encounter (Signed)
Can we see him Thursday rather than next week?

## 2021-07-07 ENCOUNTER — Other Ambulatory Visit: Payer: Self-pay

## 2021-07-08 ENCOUNTER — Ambulatory Visit (INDEPENDENT_AMBULATORY_CARE_PROVIDER_SITE_OTHER): Payer: Medicaid Other

## 2021-07-08 ENCOUNTER — Ambulatory Visit: Payer: Medicaid Other | Admitting: Podiatry

## 2021-07-08 ENCOUNTER — Other Ambulatory Visit: Payer: Self-pay

## 2021-07-08 DIAGNOSIS — M14672 Charcot's joint, left ankle and foot: Secondary | ICD-10-CM | POA: Diagnosis not present

## 2021-07-08 DIAGNOSIS — I83892 Varicose veins of left lower extremities with other complications: Secondary | ICD-10-CM | POA: Diagnosis not present

## 2021-07-08 DIAGNOSIS — I83029 Varicose veins of left lower extremity with ulcer of unspecified site: Secondary | ICD-10-CM

## 2021-07-08 DIAGNOSIS — R609 Edema, unspecified: Secondary | ICD-10-CM | POA: Diagnosis not present

## 2021-07-08 DIAGNOSIS — L97929 Non-pressure chronic ulcer of unspecified part of left lower leg with unspecified severity: Secondary | ICD-10-CM | POA: Diagnosis not present

## 2021-07-10 ENCOUNTER — Other Ambulatory Visit: Payer: Self-pay | Admitting: Cardiology

## 2021-07-12 ENCOUNTER — Telehealth: Payer: Self-pay

## 2021-07-12 ENCOUNTER — Ambulatory Visit: Payer: Medicaid Other | Admitting: Podiatry

## 2021-07-12 NOTE — Telephone Encounter (Signed)
Will evalute this week

## 2021-07-12 NOTE — Telephone Encounter (Signed)
Cary form [facility] called and LVM stating pt has developed two new wounds ar the Lt medial aspect of his foot and it could be from the boot rubbing his leg. Carrry states she dressed It up with an abd pad and kerlix.

## 2021-07-15 ENCOUNTER — Encounter: Payer: Self-pay | Admitting: Podiatry

## 2021-07-15 ENCOUNTER — Ambulatory Visit: Payer: Medicaid Other | Admitting: Podiatry

## 2021-07-15 ENCOUNTER — Other Ambulatory Visit: Payer: Self-pay

## 2021-07-15 ENCOUNTER — Ambulatory Visit (INDEPENDENT_AMBULATORY_CARE_PROVIDER_SITE_OTHER): Payer: Medicaid Other

## 2021-07-15 DIAGNOSIS — R609 Edema, unspecified: Secondary | ICD-10-CM

## 2021-07-15 DIAGNOSIS — I83892 Varicose veins of left lower extremities with other complications: Secondary | ICD-10-CM | POA: Diagnosis not present

## 2021-07-15 DIAGNOSIS — M14672 Charcot's joint, left ankle and foot: Secondary | ICD-10-CM | POA: Diagnosis not present

## 2021-07-15 DIAGNOSIS — I83029 Varicose veins of left lower extremity with ulcer of unspecified site: Secondary | ICD-10-CM | POA: Diagnosis not present

## 2021-07-15 DIAGNOSIS — L97929 Non-pressure chronic ulcer of unspecified part of left lower leg with unspecified severity: Secondary | ICD-10-CM

## 2021-07-15 NOTE — Progress Notes (Signed)
  Subjective:  Patient ID: Samuel Bailey, male    DOB: 01-09-1961,  MRN: [id number]  No chief complaint on file.  60 y.o. male presents for wound care. Has not been wearing his boot. Presents in normal sandals today. Objective:  Physical Exam: Wound Location: left gaiter region ankle Wound Measurement: healed Wound Base: Mixed Granular/Fibrotic Peri-wound: Normal Exudate: Moderate amount Serosanguinous exudate wound without warmth, erythema, signs of acute infection  Wound Location: left proximal leg Wound Measurement:   0.5x0.5 Wound Base: Granular/Healthy Peri-wound: Normal Exudate: Scant/small amount Serosanguinous exudate wound without warmth, erythema, signs of acute infection  Right Foot: No active warmth, erythema or edema to the right foot. Stable deformity at the medial arch; deformity medially with obvious bone dislocation, pain to palpation but no crepitus at the mid tarsal joints. Rocker bottom deformity.   Left Foot: midfoot warmth, POP, edema, visible midfoot deformity.  Radiographs: XR taken and reviewed. Worsening midfoot dislocation Assessment:   1. Charcot's joint of left foot   2. Venous stasis ulcer of left lower leg with edema of left lower leg (HCC)     Plan:  Patient was evaluated and treated and all questions answered.  Ulcer left leg -Continues to improve. Cleansed and reapplied today  Procedure: Multilayer Compression dressing Rationale: venous insufficiency Technique: Unna boot, cast padding, Coban compression dressing applied Disposition: Patient tolerated procedure well.   Charcot foot left -Again discussed the importance of offloading to prevent worsening. He was given a boot -charity as his insurance would not cover it - to ensure compliance with NWB. He is at real risk of impending ulceration should his deformity worsen.    No follow-ups on file.

## 2021-07-15 NOTE — Progress Notes (Signed)
  Subjective:  Patient ID: Samuel Bailey, male    DOB: 04-Feb-1961,  MRN: [id number]  Chief Complaint  Patient presents with   Foot Pain    I had a new blister pop up and the nurse noticed it monday   60 y.o. male presents for wound care.  Has been trying to stop it is much as possible is wearing the boot today.  Developed a blister earlier this week noted by his nurse.  Denies nausea vomiting fever chills Objective:  Physical Exam:  Healed venous ulcers to the left leg without warmth erythema signs of acute infection  right Foot: No active warmth, erythema or edema to the right foot. Stable deformity at the medial arch; deformity medially with obvious bone dislocation, pain to palpation but no crepitus at the mid tarsal joints. Rocker bottom deformity.   Left Foot: midfoot warmth, POP, edema, visible midfoot deformity.  With new ulceration at the medial aspect of the first cuneiform without active warmth erythema signs of infection Assessment:   1. Charcot's joint of left foot     Plan:  Patient was evaluated and treated and all questions answered.  Ulcer left leg -Appears mostly resolved.  Repeat Unna boot applied today.  ABD applied to the medial aspect of the foot to offload the bony prominence The Procedure: Multilayer Compression dressing Rationale: venous insufficiency Technique: Unna boot, cast padding, Coban compression dressing applied Disposition: Patient tolerated procedure well.   Charcot foot left -He is finally wearing the boot after giving him 1 last week however he is developing worsening dislocation as evidenced on x-rays and the fact that he has a medial blister that is likely going to be calm ulcerated.  He is at high risk of having had surgical invention and is at risk of worsening ulceration and infection and he needs to stay off of this as he has been told several times before  Return in about 1 week (around 07/22/2021) for wound check.

## 2021-07-22 ENCOUNTER — Ambulatory Visit: Payer: Medicaid Other | Admitting: Podiatry

## 2021-07-22 ENCOUNTER — Other Ambulatory Visit: Payer: Self-pay

## 2021-07-22 ENCOUNTER — Encounter: Payer: Self-pay | Admitting: Podiatry

## 2021-07-22 DIAGNOSIS — L97522 Non-pressure chronic ulcer of other part of left foot with fat layer exposed: Secondary | ICD-10-CM

## 2021-07-22 DIAGNOSIS — M14672 Charcot's joint, left ankle and foot: Secondary | ICD-10-CM

## 2021-07-22 DIAGNOSIS — R609 Edema, unspecified: Secondary | ICD-10-CM | POA: Diagnosis not present

## 2021-07-22 DIAGNOSIS — L97929 Non-pressure chronic ulcer of unspecified part of left lower leg with unspecified severity: Secondary | ICD-10-CM

## 2021-07-22 DIAGNOSIS — I83029 Varicose veins of left lower extremity with ulcer of unspecified site: Secondary | ICD-10-CM | POA: Diagnosis not present

## 2021-07-22 DIAGNOSIS — I83892 Varicose veins of left lower extremities with other complications: Secondary | ICD-10-CM | POA: Diagnosis not present

## 2021-07-22 NOTE — Progress Notes (Signed)
  Subjective:  Patient ID: Samuel Bailey, male    DOB: 1961-05-14,  MRN: [id number]  Chief Complaint  Patient presents with   Foot Ulcer    The spots on the left leg are doing better and healing and the spot on the instep is not healing and does drain   60 y.o. male presents for wound care.  Has been trying to stay off of it and using his boot and occasionally his wheelchair. Objective:  Physical Exam:  Healed venous ulcers to the left leg without warmth erythema signs of acute infection  Right Foot: No active warmth, erythema or edema to the right foot. Stable deformity at the medial arch; deformity medially with obvious bone dislocation, pain to palpation but no crepitus at the mid tarsal joints. Rocker bottom deformity.   Left Foot: midfoot warmth, POP, edema, visible midfoot deformity.  With new ulceration at the medial aspect measuring 1x1.5 of the first cuneiform without active warmth erythema signs of infection Assessment:   1. Venous stasis ulcer of left lower leg with edema of left lower leg (HCC)   2. Charcot's joint of left foot     Plan:  Patient was evaluated and treated and all questions answered.  Ulcer left leg -Appears mostly resolved.  Repeat Unna boot applied today  Procedure: Multilayer Compression dressing Rationale: venous insufficiency Technique: Unna boot, cast padding, Coban compression dressing applied Disposition: Patient tolerated procedure well.   Charcot foot left -The medial blister has now ulcerated. This was dressed with a padded unna boot. He is at high risk of having had surgical invention and is at risk of worsening ulceration and infection. We may need to excise part of the medial cuneiform. -Discussed importance of staying off his foot and wearing his boot.  No follow-ups on file.

## 2021-08-02 ENCOUNTER — Other Ambulatory Visit: Payer: Self-pay

## 2021-08-02 ENCOUNTER — Ambulatory Visit: Payer: Medicaid Other | Admitting: Podiatry

## 2021-08-02 DIAGNOSIS — I83029 Varicose veins of left lower extremity with ulcer of unspecified site: Secondary | ICD-10-CM

## 2021-08-02 DIAGNOSIS — R609 Edema, unspecified: Secondary | ICD-10-CM

## 2021-08-02 DIAGNOSIS — M14672 Charcot's joint, left ankle and foot: Secondary | ICD-10-CM

## 2021-08-02 DIAGNOSIS — L97929 Non-pressure chronic ulcer of unspecified part of left lower leg with unspecified severity: Secondary | ICD-10-CM

## 2021-08-02 DIAGNOSIS — I83892 Varicose veins of left lower extremities with other complications: Secondary | ICD-10-CM | POA: Diagnosis not present

## 2021-08-02 DIAGNOSIS — L97522 Non-pressure chronic ulcer of other part of left foot with fat layer exposed: Secondary | ICD-10-CM | POA: Diagnosis not present

## 2021-08-02 NOTE — Progress Notes (Signed)
  Subjective:  Patient ID: Samuel Bailey, male    DOB: Mar 28, 1961,  MRN: [id number]  Chief Complaint  Patient presents with   Wound Check    F/U Lt wound check at Lt leg and medial foot -per pt wounds looks better Tx: boto and wound care by home care   60 y.o. male presents for wound care.  Objective:  Physical Exam:  Healed venous ulcers to the left leg without warmth erythema signs of acute infection  Right Foot: No active warmth, erythema or edema to the right foot. Stable deformity at the medial arch; deformity medially with obvious bone dislocation, pain to palpation but no crepitus at the mid tarsal joints. Rocker bottom deformity.   Left Foot: midfoot warmth, POP, edema, visible midfoot deformity.  Ulceration at the medial aspect measuring 1x1 of the first cuneiform without active warmth erythema signs of infection Assessment:   1. Venous stasis ulcer of left lower leg with edema of left lower leg (HCC)   2. Charcot's joint of left foot   3. Ulcer of foot, left, with fat layer exposed (HCC)      Plan:  Patient was evaluated and treated and all questions answered.  Ulcer left leg -Resolved  Charcot foot left -Remains ulcerated but improved compared to last week. Still may need surgical intervention though this is not preferred in acute Charcot. No probe to bone today. Dressed with unna boot for reduction of edema.  No follow-ups on file.

## 2021-08-18 ENCOUNTER — Telehealth: Payer: Self-pay

## 2021-08-18 NOTE — Telephone Encounter (Signed)
Carry RN_[facility]  Called and LVM stating pt's Lt wound is healed. Nurse applied a padding and unna boot. And would like to know any new wound orders for pt.

## 2021-08-19 ENCOUNTER — Ambulatory Visit: Payer: Medicaid Other | Admitting: Podiatry

## 2021-08-24 NOTE — Telephone Encounter (Signed)
Can d/c the Unna boots once swelling normalizes

## 2021-08-25 NOTE — Telephone Encounter (Signed)
Contacted [facility] and relayed Dr. Sherlene Shams order to D/c unna once swelling normalizes.

## 2021-08-30 ENCOUNTER — Telehealth: Payer: Self-pay

## 2021-08-30 NOTE — Telephone Encounter (Signed)
Samuel Bailey from [facility]  called stating they have D/C pt from home care since wounds are healed and remains healed.

## 2021-08-31 NOTE — Telephone Encounter (Signed)
Noted thanks °

## 2021-09-06 ENCOUNTER — Ambulatory Visit: Payer: Medicaid Other | Admitting: Podiatry

## 2021-09-06 ENCOUNTER — Other Ambulatory Visit: Payer: Self-pay

## 2021-09-06 DIAGNOSIS — I83892 Varicose veins of left lower extremities with other complications: Secondary | ICD-10-CM

## 2021-09-06 DIAGNOSIS — R609 Edema, unspecified: Secondary | ICD-10-CM

## 2021-09-06 DIAGNOSIS — L97522 Non-pressure chronic ulcer of other part of left foot with fat layer exposed: Secondary | ICD-10-CM

## 2021-09-06 DIAGNOSIS — I83029 Varicose veins of left lower extremity with ulcer of unspecified site: Secondary | ICD-10-CM

## 2021-09-06 DIAGNOSIS — L97929 Non-pressure chronic ulcer of unspecified part of left lower leg with unspecified severity: Secondary | ICD-10-CM

## 2021-09-06 DIAGNOSIS — M14672 Charcot's joint, left ankle and foot: Secondary | ICD-10-CM

## 2021-09-06 NOTE — Progress Notes (Signed)
  Subjective:  Patient ID: Samuel Bailey, male    DOB: [birth year],  MRN: [id number]  Chief Complaint  Patient presents with   Wound Check    F/U Lt wound check -pt state," wound is healed." - no draiange - less swelling and redness - nurse DC pt from home care last Monday - unna boot dressing intact   60 y.o. male presents for wound care.  Objective:  Physical Exam:  Healed venous ulcers to the left leg without warmth erythema signs of acute infection  Right Foot: No active warmth, erythema or edema to the right foot. Stable deformity at the medial arch; deformity medially with obvious bone dislocation, pain to palpation but no crepitus at the mid tarsal joints. Rocker bottom deformity.   Left Foot: midfoot warmth, POP, edema, visible midfoot deformity.  Ulceration healed at the medial aspect of the first cuneiform without active warmth erythema signs of infection Assessment:   1. Charcot's joint of left foot   2. Venous stasis ulcer of left lower leg with edema of left lower leg (HCC)   3. Ulcer of foot, left, with fat layer exposed (HCC)    Plan:  Patient was evaluated and treated and all questions answered.  Ulcer left leg -Resolved -Dispense tubigrip for reduction of edema.  Charcot foot left -Ulcer has healed. Still edematous. Arch maintained. Prominent bone medial foot. Will get Xrs next visit -Continue CAM boot. Discsused importance of NWB as much as he can.  Return in about 3 weeks (around 09/27/2021) for charcot f/u , with XRs.

## 2021-09-30 ENCOUNTER — Ambulatory Visit: Payer: Medicaid Other | Admitting: Podiatry

## 2021-09-30 ENCOUNTER — Other Ambulatory Visit: Payer: Self-pay

## 2021-09-30 ENCOUNTER — Ambulatory Visit (INDEPENDENT_AMBULATORY_CARE_PROVIDER_SITE_OTHER): Payer: Medicaid Other

## 2021-09-30 ENCOUNTER — Encounter: Payer: Self-pay | Admitting: Podiatry

## 2021-09-30 DIAGNOSIS — M14672 Charcot's joint, left ankle and foot: Secondary | ICD-10-CM | POA: Diagnosis not present

## 2021-10-07 NOTE — Progress Notes (Signed)
  Subjective:  Patient ID: Samuel Bailey, male    DOB: 11/24/1961,  MRN: [id number]  Chief Complaint  Patient presents with   Foot Pain    The left foot is about the same and the boot helps a little   60 y.o. male presents for wound care.  Objective:  Physical Exam:  Healed venous ulcers to the left leg without warmth erythema signs of acute infection  Right Foot: No active warmth, erythema or edema to the right foot. Stable deformity at the medial arch; deformity medially with obvious bone dislocation, pain to palpation but no crepitus at the mid tarsal joints. Rocker bottom deformity.   Left Foot: midfoot warmth, POP, edema, visible midfoot deformity.  Ulceration healed at the medial aspect of the first cuneiform without active warmth erythema signs of infection Assessment:   1. Charcot's joint of left foot    Plan:  Patient was evaluated and treated and all questions answered.  Ulcer left leg -Resolved   Charcot foot left -Ulcer has healed. Still edematous. Arch maintained. Prominent bone medial foot. No breakdown through the skin, though we discussed the importance again of minimizing weight bearing to prevent further complication -Continue CAM boot  No follow-ups on file.

## 2021-10-13 ENCOUNTER — Other Ambulatory Visit: Payer: Self-pay | Admitting: Cardiology

## 2021-10-21 ENCOUNTER — Ambulatory Visit (INDEPENDENT_AMBULATORY_CARE_PROVIDER_SITE_OTHER): Payer: Medicaid Other

## 2021-10-21 ENCOUNTER — Encounter: Payer: Self-pay | Admitting: Podiatry

## 2021-10-21 ENCOUNTER — Ambulatory Visit: Payer: Medicaid Other | Admitting: Podiatry

## 2021-10-21 ENCOUNTER — Other Ambulatory Visit: Payer: Self-pay

## 2021-10-21 DIAGNOSIS — M14672 Charcot's joint, left ankle and foot: Secondary | ICD-10-CM

## 2021-10-21 DIAGNOSIS — L97929 Non-pressure chronic ulcer of unspecified part of left lower leg with unspecified severity: Secondary | ICD-10-CM | POA: Diagnosis not present

## 2021-10-21 DIAGNOSIS — I83029 Varicose veins of left lower extremity with ulcer of unspecified site: Secondary | ICD-10-CM

## 2021-10-21 DIAGNOSIS — I83892 Varicose veins of left lower extremities with other complications: Secondary | ICD-10-CM | POA: Diagnosis not present

## 2021-10-21 DIAGNOSIS — R609 Edema, unspecified: Secondary | ICD-10-CM

## 2021-10-21 NOTE — Progress Notes (Signed)
  Subjective:  Patient ID: Samuel Bailey, male    DOB: 08-Jun-1961,  MRN: [id number]  Chief Complaint  Patient presents with   Foot Pain    I had a spot come up on the right leg area and it is about healed up and the nurse came out to see me and the left foot is about the same   60 y.o. male presents for wound care.  Objective:  Physical Exam:  Healed venous ulcers to the left leg without warmth erythema signs of acute infection, recurrence of small venous leg ulcer right medial leg without warmth/erythema/SOI  Right Foot: No active warmth, erythema or edema to the right foot. Stable deformity at the medial arch; deformity medially with obvious bone dislocation, pain to palpation but no crepitus at the mid tarsal joints. Rocker bottom deformity.   Left Foot: midfoot warmth, POP, edema, visible midfoot deformity.  Ulceration healed at the medial aspect of the first cuneiform without active warmth erythema signs of infection Assessment:   1. Charcot's joint of left foot   2. Venous stasis ulcer of left lower leg with edema of left lower leg (HCC)    Plan:  Patient was evaluated and treated and all questions answered.  Ulcer right leg -Dressed with silvadene, foam border dressing and tubigrip  Charcot foot left -Ulcer remains healed. Charcot appears to be resolving with consolidation on XR. Will continue immobilization x1 month and then d/c boot. -Continue CAM boot  Return in about 1 month (around 11/20/2021) for Charcot f/u left with XRs.

## 2021-11-18 ENCOUNTER — Ambulatory Visit (INDEPENDENT_AMBULATORY_CARE_PROVIDER_SITE_OTHER): Payer: Medicaid Other

## 2021-11-18 ENCOUNTER — Ambulatory Visit: Payer: Medicaid Other | Admitting: Podiatry

## 2021-11-18 ENCOUNTER — Encounter: Payer: Self-pay | Admitting: Podiatry

## 2021-11-18 VITALS — Temp 97.7°F

## 2021-11-18 DIAGNOSIS — M14672 Charcot's joint, left ankle and foot: Secondary | ICD-10-CM | POA: Diagnosis not present

## 2021-11-18 DIAGNOSIS — I83029 Varicose veins of left lower extremity with ulcer of unspecified site: Secondary | ICD-10-CM

## 2021-11-18 DIAGNOSIS — L97522 Non-pressure chronic ulcer of other part of left foot with fat layer exposed: Secondary | ICD-10-CM

## 2021-11-19 NOTE — Progress Notes (Signed)
  Subjective:  Patient ID: Samuel Bailey, male    DOB: 02/14/1961,  MRN: [id number]  Chief Complaint  Patient presents with   Foot Pain    The right leg has a spot on it and I have been using some stuff that the nurse left and the left leg I wear the compression and swells some and I did not wear the boot today due to I have been gone since 6 am   60 y.o. male presents for wound care.  Objective:  Physical Exam:  Small venous leg ulcer right medial leg with mild periwound erythema. No purulence. No fluctuance.  Right Foot: No active warmth, erythema or edema to the right foot. Stable deformity at the medial arch; deformity medially with obvious bone dislocation, pain to palpation but no crepitus at the mid tarsal joints. Rocker bottom deformity.   Left Foot: mild midfoot warmth, POP, edema, visible midfoot deformity.  Ulceration remains healed at the medial aspect of the first cuneiform without active warmth erythema signs of infection Assessment:   1. Charcot's joint of left foot   2. Venous stasis ulcer of left lower leg with edema of left lower leg (HCC)   3. Ulcer of foot, left, with fat layer exposed (HCC)    Plan:  Patient was evaluated and treated and all questions answered.  Ulcer right leg -Dressed with silvadene, foam border dressing and tubigrip -Will order venous reflux to r/o DVT and evaluate the   Charcot foot left -No active ulceration noted. -Discussed importance of wearing his boot to prevent ulceration. He is at very high risk given deformity and previous hx of ulceration.  Return in about 4 weeks (around 12/16/2021) for Wound Care.

## 2021-11-23 ENCOUNTER — Telehealth: Payer: Self-pay | Admitting: *Deleted

## 2021-11-23 NOTE — Telephone Encounter (Signed)
Called and spoke with patient stating that the patient had an appointment at [facility] on 11-26-2021 at 1:00 pm. Samuel Bailey

## 2021-12-30 ENCOUNTER — Ambulatory Visit: Payer: Medicaid Other | Admitting: Podiatry

## 2021-12-30 ENCOUNTER — Other Ambulatory Visit: Payer: Self-pay

## 2021-12-30 ENCOUNTER — Ambulatory Visit (INDEPENDENT_AMBULATORY_CARE_PROVIDER_SITE_OTHER): Payer: Medicaid Other

## 2021-12-30 DIAGNOSIS — I83018 Varicose veins of right lower extremity with ulcer other part of lower leg: Secondary | ICD-10-CM | POA: Diagnosis not present

## 2021-12-30 DIAGNOSIS — M14672 Charcot's joint, left ankle and foot: Secondary | ICD-10-CM

## 2021-12-30 DIAGNOSIS — M86172 Other acute osteomyelitis, left ankle and foot: Secondary | ICD-10-CM | POA: Diagnosis not present

## 2021-12-30 DIAGNOSIS — L97811 Non-pressure chronic ulcer of other part of right lower leg limited to breakdown of skin: Secondary | ICD-10-CM | POA: Diagnosis not present

## 2021-12-30 DIAGNOSIS — I83892 Varicose veins of left lower extremities with other complications: Secondary | ICD-10-CM

## 2021-12-30 DIAGNOSIS — R600 Localized edema: Secondary | ICD-10-CM

## 2021-12-30 DIAGNOSIS — L97522 Non-pressure chronic ulcer of other part of left foot with fat layer exposed: Secondary | ICD-10-CM | POA: Diagnosis not present

## 2021-12-30 DIAGNOSIS — L97929 Non-pressure chronic ulcer of unspecified part of left lower leg with unspecified severity: Secondary | ICD-10-CM

## 2021-12-31 ENCOUNTER — Encounter: Payer: Self-pay | Admitting: Podiatry

## 2021-12-31 LAB — CBC WITH DIFFERENTIAL/PLATELET
Basophils Absolute: 0.1 10*3/uL (ref 0.0–0.2)
Basos: 1 %
EOS (ABSOLUTE): 0.3 10*3/uL (ref 0.0–0.4)
Eos: 3 %
Hematocrit: 39.8 % (ref 37.5–51.0)
Hemoglobin: 13.3 g/dL (ref 13.0–17.7)
Immature Grans (Abs): 0.1 10*3/uL (ref 0.0–0.1)
Immature Granulocytes: 1 %
Lymphocytes Absolute: 2.5 10*3/uL (ref 0.7–3.1)
Lymphs: 30 %
MCH: 28.6 pg (ref 26.6–33.0)
MCHC: 33.4 g/dL (ref 31.5–35.7)
MCV: 86 fL (ref 79–97)
Monocytes Absolute: 0.6 10*3/uL (ref 0.1–0.9)
Monocytes: 7 %
Neutrophils Absolute: 5.1 10*3/uL (ref 1.4–7.0)
Neutrophils: 58 %
Platelets: 326 10*3/uL (ref 150–450)
RBC: 4.65 x10E6/uL (ref 4.14–5.80)
RDW: 12.4 % (ref 11.6–15.4)
WBC: 8.6 10*3/uL (ref 3.4–10.8)

## 2021-12-31 LAB — C-REACTIVE PROTEIN: CRP: 84 mg/L — ABNORMAL HIGH (ref 0–10)

## 2021-12-31 LAB — SEDIMENTATION RATE: Sed Rate: 78 mm/hr — ABNORMAL HIGH (ref 0–30)

## 2022-01-05 ENCOUNTER — Telehealth: Payer: Self-pay | Admitting: *Deleted

## 2022-01-05 NOTE — Telephone Encounter (Signed)
Called patient and I was trying to leave a message and the mail box has not been set up and spoke with Apolonio Schneiders from [facility] and the procedure code [id number] needs an authorization and it was approved and the authorization number is [id number] and is valid from 01-04-2022 thru 02-18-2022 and called and spoke with [facility] and got an appointment for 01-06-2022 at 1:15 pm. Lattie Haw

## 2022-01-06 NOTE — Progress Notes (Signed)
°  Subjective:  Patient ID: Samuel Bailey, male    DOB: June 06, 1961,  MRN: [id number]  Chief Complaint  Patient presents with   Foot Ulcer    I have a few spots on the left leg that I am wrapping and I have a spot on the bottom of the left heel that I am using the unna boot and it is draining    61 y.o. male presents for wound care. States the left foot ulcerated about 3 weeks ago but did not inform us about it. Has not been wearing the boot, has been wearing his slide sandals. Has been caring for the wound himself. Had the US done on the right side but was not told results. Objective:  Physical Exam:  Small venous leg ulcer right medial leg with mild periwound erythema. No purulence. No fluctuance.  Right Foot: No active warmth, erythema or edema to the right foot. Stable deformity at the medial arch; deformity medially with obvious bone dislocation, pain to palpation but no crepitus at the mid tarsal joints. Rocker bottom deformity.   Left Foot: mild midfoot warmth, POP, edema, visible midfoot deformity.  Large 3x3 cm ulceration with serous dressing, no warmth, erythema, signs of acute infection. Wound fully granular. No probe to bone. Assessment:   1. Charcot's joint of left foot   2. Ulcer of foot, left, with fat layer exposed (HCC)    Plan:  Patient was evaluated and treated and all questions answered.  Ulcer right leg -Resolved. Reviewed venous studies with patient - no e/o DVT. MLC reapplied Right  Procedure: Multilayer Compression dressing Rationale: venous insufficiency Technique: Unna boot, cast padding, Coban compression dressing applied Disposition: Patient tolerated procedure well.   Charcot foot left -He now has a large ulceration at the midfoot. It does not appear clinically infected, however I am concerned for underlying osteomyelitis. -Order MRI for eval. -Check infectious markers. -Discussed again the importance of compliance with WB restrictions and use of his  boot. The area has ulcerated due to non-compliance. He stands a large chance at limb loss because of this. Patient informed us he would wear his boot. -Discussed importance of daily dressing changes. Dressed with betadine DSD. -If no infection is noted will referto [facility] for further care, but will hold referral until MRI.  Return in about 2 weeks (around 01/13/2022).

## 2022-01-13 ENCOUNTER — Ambulatory Visit: Payer: Medicaid Other | Admitting: Podiatry

## 2022-03-07 ENCOUNTER — Other Ambulatory Visit: Payer: Self-pay

## 2022-04-23 ENCOUNTER — Other Ambulatory Visit: Payer: Self-pay | Admitting: Cardiology

## 2022-07-14 ENCOUNTER — Emergency Department (HOSPITAL_COMMUNITY): Payer: Medicaid Other

## 2022-07-14 ENCOUNTER — Other Ambulatory Visit: Payer: Self-pay

## 2022-07-14 ENCOUNTER — Encounter (HOSPITAL_COMMUNITY): Payer: Self-pay | Admitting: Emergency Medicine

## 2022-07-14 ENCOUNTER — Emergency Department (HOSPITAL_COMMUNITY)
Admission: EM | Admit: 2022-07-14 | Discharge: 2022-07-14 | Disposition: A | Discharge status: Home / Self Care | Payer: Medicaid Other | Attending: Emergency Medicine | Admitting: Emergency Medicine

## 2022-07-14 DIAGNOSIS — I10 Essential (primary) hypertension: Secondary | ICD-10-CM | POA: Insufficient documentation

## 2022-07-14 DIAGNOSIS — Z794 Long term (current) use of insulin: Secondary | ICD-10-CM | POA: Insufficient documentation

## 2022-07-14 DIAGNOSIS — Z79899 Other long term (current) drug therapy: Secondary | ICD-10-CM | POA: Diagnosis not present

## 2022-07-14 DIAGNOSIS — Z7982 Long term (current) use of aspirin: Secondary | ICD-10-CM | POA: Insufficient documentation

## 2022-07-14 DIAGNOSIS — R079 Chest pain, unspecified: Secondary | ICD-10-CM | POA: Insufficient documentation

## 2022-07-14 LAB — BASIC METABOLIC PANEL
Anion gap: 11 (ref 5–15)
BUN: 17 mg/dL (ref 6–20)
CO2: 21 mmol/L — ABNORMAL LOW (ref 22–32)
Calcium: 9 mg/dL (ref 8.9–10.3)
Chloride: 101 mmol/L (ref 98–111)
Creatinine, Ser: 1.02 mg/dL (ref 0.61–1.24)
GFR, Estimated: >60 mL/min (ref >60)
Glucose, Bld: 258 mg/dL — ABNORMAL HIGH (ref 70–99)
Potassium: 3.8 mmol/L (ref 3.5–5.1)
Sodium: 133 mmol/L — ABNORMAL LOW (ref 135–145)

## 2022-07-14 LAB — CBC
HCT: 40.5 % (ref 39.0–52.0)
Hemoglobin: 14.2 g/dL (ref 13.0–17.0)
MCH: 29.8 pg (ref 26.0–34.0)
MCHC: 35.1 g/dL (ref 30.0–36.0)
MCV: 84.9 fL (ref 80.0–100.0)
Platelets: 200 10*3/uL (ref 150–400)
RBC: 4.77 MIL/uL (ref 4.22–5.81)
RDW: 12.6 % (ref 11.5–15.5)
WBC: 7 10*3/uL (ref 4.0–10.5)
nRBC: 0 % (ref 0.0–0.2)

## 2022-07-14 LAB — TROPONIN I (HIGH SENSITIVITY)
Troponin I (High Sensitivity): 12 ng/L (ref <18)
Troponin I (High Sensitivity): 12 ng/L (ref <18)

## 2022-07-14 NOTE — ED Triage Notes (Signed)
Pt c/o CP onset 1300, found by EMS laying in bed, advised he was "too weak to get out." Sharp R sided CP radiating to L neck, R arm. Worse w movement, exacerbation. Hx COPD, MI/stent in November  2NTG PTA EMS, denied a 3rd 324 ASA by EMS 18g R FA  180/110 HR 96

## 2022-07-14 NOTE — ED Provider Notes (Signed)
[facility] EMERGENCY DEPARTMENT Provider Note   CSN: [id number] Arrival date & time: 07/14/22  1812     History  Chief Complaint  Patient presents with   Generalized Body Aches   Chest Pain    Samuel Bailey is a 61 y.o. male.  Patient reports he had an episode of chest pain today.  patient reports he had pain in the left side of his chest.  Patient reports he took a nitroglycerin and pain resolved.  Patient reports he has had chest pain in the past and takes nitroglycerin for discomfort.  Patient reports he came to the emergency department because he felt weaker than usual today as well.  Patient denies any current chest pain he denies any shortness of breath.  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  Chest Pain Pain location:  L chest      Home Medications Prior to Admission medications   Medication Sig Start Date End Date Taking? Authorizing Provider  aspirin 81 MG EC tablet Take 1 tablet (81 mg total) by mouth daily. 03/29/21   Tobb, Kardie, DO  atorvastatin (LIPITOR) 40 MG tablet Take 1 tablet (40 mg total) by mouth daily. 03/29/21   Tobb, Kardie, DO  B-D ULTRAFINE III SHORT PEN 31G X 8 MM MISC Inject into the skin daily. 05/31/21   [provider]  DALIRESP 500 MCG TABS tablet Take 500 mcg by mouth daily. 11/21/20   [provider]  DUREZOL 0.05 % EMUL START AFTER SURGERY, INSTILL 1 DROP IN LEFT EYE DAILY FOR 1 MONTH 01/26/21   [provider]  ezetimibe (ZETIA) 10 MG tablet Take 1 tablet (10 mg total) by mouth daily. 03/29/21   Tobb, Kardie, DO  Fenofibrate 150 MG CAPS Take 1 capsule (150 mg total) by mouth daily. 11/09/20   Tobb, Kardie, DO  FLUoxetine (PROZAC) 20 MG capsule Take 20 mg by mouth  daily. 01/11/21   [provider]  furosemide (LASIX) 20 MG tablet Take 1 tablet (20 mg total) by mouth 2 (two) times a week. Please take this on Tuesday and Saturday 03/29/21 06/27/21  Tobb, Kardie, DO  gabapentin (NEURONTIN) 300 MG capsule Take 300 mg by mouth 3 (three) times daily.     [provider]  insulin aspart (NOVOLOG) 100 UNIT/ML injection Inject 3-10 Units into the skin 3 (three) times daily with meals. Per sliding scale    [provider]  insulin glargine (LANTUS) 100 unit/mL SOPN Inject 70 Units into the skin 2 (two) times daily. 12/14/20   Duke, Roe Rutherford, PA  isosorbide mononitrate (IMDUR) 30 MG 24 hr tablet Take 1 tablet (30 mg total) by mouth daily. 03/29/21   Tobb, Kardie, DO  losartan (COZAAR) 25 MG tablet TAKE 1/2 TABLET (12.5 MG TOTAL) BY MOUTH DAILY. 03/29/21 03/29/22  Tobb, Kardie, DO  metoprolol succinate (TOPROL-XL) 25 MG 24 hr tablet TAKE 1/2 TABLET (12.5 MG TOTAL) BY MOUTH DAILY. 03/29/21 03/29/22  Tobb, Kardie, DO  nitroGLYCERIN (NITROSTAT) 0.4 MG SL tablet PLACE 1 TABLET (0.4 MG TOTAL) UNDER THE TONGUE EVERY FIVE MINUTES X 3 DOSES AS NEEDED FOR CHEST PAIN. 03/29/21 03/29/22  Tobb, Kardie, DO  ofloxacin (OCUFLOX) 0.3 % ophthalmic solution START AFTER SURGERY, INTILL 1 DROP IN LEFT EYE 4 TIMES  DIALY FOR 1 WEEK THEN STOP 01/26/21   [provider]  omeprazole (PRILOSEC) 40 MG capsule Take 40 mg by mouth in the morning and at bedtime.     [provider]  potassium chloride SA (KLOR-CON) 20 MEQ tablet Take 1 tablet (20 mEq total) by mouth 2 (two) times daily. 03/29/21   Tobb, Kardie, DO  ranolazine (RANEXA) 500 MG 12 hr tablet TAKE 1 TABLET BY MOUTH TWICE A DAY 04/25/22   Tobb, Kardie, DO  sulfamethoxazole-trimethoprim (BACTRIM) 400-80 MG tablet Take 1 tablet by mouth 2 (two) times daily. 06/01/21   Landis Martins, DPM  tiotropium (SPIRIVA) 18 MCG inhalation capsule Place 18 mcg into inhaler and inhale daily.    [provider]       Allergies    Lovastatin    Review of Systems   Review of Systems  Cardiovascular:  Positive for chest pain.  All other systems reviewed and are negative.   Physical Exam Updated Vital Signs BP (!) 146/88   Pulse 77   Temp 98.3 F (36.8 C) (Oral)   Resp 19   SpO2 97%  Physical Exam Vitals and nursing note reviewed.  Constitutional:      Appearance: He is well-developed.  HENT:     Head: Normocephalic.  Cardiovascular:     Rate and Rhythm: Normal rate and regular rhythm.     Heart sounds: Normal heart sounds.  Pulmonary:     Effort: Pulmonary effort is normal.     Breath sounds: Normal breath sounds.  Abdominal:     General: Bowel sounds are normal. There is no distension.     Palpations: Abdomen is soft.  Musculoskeletal:        General: Normal range of motion.     Cervical back: Normal range of motion.  Skin:    General: Skin is warm.  Neurological:     General: No focal deficit present.     Mental Status: He is alert and oriented to person, place, and time.  Psychiatric:        Mood and Affect: Mood normal.     ED Results / Procedures / Treatments   Labs (all labs ordered are listed, but only abnormal results are displayed) Labs Reviewed  BASIC METABOLIC PANEL - Abnormal; Notable for the following components:      Result Value   Sodium 133 (*)    CO2 21 (*)    Glucose, Bld 258 (*)    All other components within normal limits  CBC  TROPONIN I (HIGH SENSITIVITY)  TROPONIN I (HIGH SENSITIVITY)    EKG EKG Interpretation  Date/Time:  Thursday July 14 2022 22:35:52 EDT Ventricular Rate:  76 PR Interval:  176 QRS Duration: 96 QT Interval:  397 QTC Calculation: 447 R Axis:   22 Text Interpretation: Sinus rhythm Probable left atrial enlargement RSR' in V1 or V2, probably normal variant Minimal ST elevation, anterior leads Confirmed by Davonna Belling [phone]) on 07/14/2022 10:40:31 PM  Radiology DG Chest 2 View  Result Date: 07/14/2022 CLINICAL  DATA:  Chest pain EXAM: CHEST - 2 VIEW COMPARISON:  01/08/2022 FINDINGS: Cardiac and mediastinal contours are within normal limits. No focal pulmonary opacity. No pleural effusion or pneumothorax. No acute osseous abnormality. IMPRESSION: No acute cardiopulmonary process. Electronically Signed   By: Merilyn Baba M.D.   On: 07/14/2022 18:50    Procedures Procedures    Medications Ordered in ED Medications - No data to display  ED Course/ Medical Decision Making/  A&P                           Medical Decision Making Patient reports he had an episode of chest pain.  He reports he went to come to the emergency department to be checked as he has had heart disease in the past  Amount and/or Complexity of Data Reviewed External Data Reviewed: notes.    Details: Primary care and cardiology notes reviewed Labs: ordered. Decision-making details documented in ED Course.    Details: Labs ordered reviewed and interpreted patient has 2 negative troponins Radiology: ordered and independent interpretation performed. Decision-making details documented in ED Course.    Details: Chest x-ray ordered and reviewed radiologist reports no acute cardiopulmonary process ECG/medicine tests: ordered and independent interpretation performed. Decision-making details documented in ED Course.    Details: EKG ordered and reviewed no acute changes found  Risk Risk Details: Discussed patient's labs and findings with him.  Patient is adamant that he does not want to be admitted to the hospital.  Counseled on risk.  Patient reports he is not currently having any pain.  I advised him to call his cardiologist tomorrow to schedule an appointment for recheck he agrees to return to the emergency department if symptoms return.           Final Clinical Impression(s) / ED Diagnoses Final diagnoses:  Chest pain, unspecified type    Rx / DC Orders ED Discharge Orders     None      An After Visit Summary was  printed and given to the patient.    Osie Cheeks 07/14/22 2307    Benjiman Core, MD 07/14/22 [phone]

## 2022-07-14 NOTE — Discharge Instructions (Signed)
Schedule to see Dr. Servando Salina for evaltuion.  Return to the Emergency department if symptoms return.
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
## 2022-12-28 ENCOUNTER — Other Ambulatory Visit: Payer: Self-pay

## 2023-05-19 ENCOUNTER — Inpatient Hospital Stay (HOSPITAL_COMMUNITY)
Admission: EM | Admit: 2023-05-19 | Discharge: 2023-05-25 | DRG: 872 | Disposition: A | Discharge status: Home Health | Payer: Medicaid Other | Attending: Internal Medicine | Admitting: Internal Medicine

## 2023-05-19 ENCOUNTER — Encounter (HOSPITAL_COMMUNITY): Payer: Self-pay | Admitting: Emergency Medicine

## 2023-05-19 ENCOUNTER — Other Ambulatory Visit: Payer: Self-pay

## 2023-05-19 ENCOUNTER — Emergency Department (HOSPITAL_COMMUNITY): Payer: Medicaid Other

## 2023-05-19 DIAGNOSIS — A4101 Sepsis due to Methicillin susceptible Staphylococcus aureus: Principal | ICD-10-CM | POA: Diagnosis present

## 2023-05-19 DIAGNOSIS — Z539 Procedure and treatment not carried out, unspecified reason: Secondary | ICD-10-CM | POA: Diagnosis present

## 2023-05-19 DIAGNOSIS — I5032 Chronic diastolic (congestive) heart failure: Secondary | ICD-10-CM | POA: Diagnosis present

## 2023-05-19 DIAGNOSIS — J449 Chronic obstructive pulmonary disease, unspecified: Secondary | ICD-10-CM | POA: Diagnosis not present

## 2023-05-19 DIAGNOSIS — R7881 Bacteremia: Secondary | ICD-10-CM | POA: Diagnosis not present

## 2023-05-19 DIAGNOSIS — I10 Essential (primary) hypertension: Secondary | ICD-10-CM | POA: Diagnosis not present

## 2023-05-19 DIAGNOSIS — E871 Hypo-osmolality and hyponatremia: Secondary | ICD-10-CM | POA: Diagnosis present

## 2023-05-19 DIAGNOSIS — Z635 Disruption of family by separation and divorce: Secondary | ICD-10-CM

## 2023-05-19 DIAGNOSIS — L02219 Cutaneous abscess of trunk, unspecified: Secondary | ICD-10-CM

## 2023-05-19 DIAGNOSIS — Z87891 Personal history of nicotine dependence: Secondary | ICD-10-CM | POA: Diagnosis not present

## 2023-05-19 DIAGNOSIS — Z823 Family history of stroke: Secondary | ICD-10-CM | POA: Diagnosis not present

## 2023-05-19 DIAGNOSIS — J4489 Other specified chronic obstructive pulmonary disease: Secondary | ICD-10-CM

## 2023-05-19 DIAGNOSIS — Z79899 Other long term (current) drug therapy: Secondary | ICD-10-CM | POA: Diagnosis not present

## 2023-05-19 DIAGNOSIS — Z8249 Family history of ischemic heart disease and other diseases of the circulatory system: Secondary | ICD-10-CM

## 2023-05-19 DIAGNOSIS — F419 Anxiety disorder, unspecified: Secondary | ICD-10-CM | POA: Diagnosis present

## 2023-05-19 DIAGNOSIS — E861 Hypovolemia: Secondary | ICD-10-CM | POA: Diagnosis present

## 2023-05-19 DIAGNOSIS — L03312 Cellulitis of back [any part except buttock]: Secondary | ICD-10-CM | POA: Diagnosis present

## 2023-05-19 DIAGNOSIS — E1151 Type 2 diabetes mellitus with diabetic peripheral angiopathy without gangrene: Secondary | ICD-10-CM | POA: Diagnosis present

## 2023-05-19 DIAGNOSIS — I2510 Atherosclerotic heart disease of native coronary artery without angina pectoris: Secondary | ICD-10-CM

## 2023-05-19 DIAGNOSIS — R6520 Severe sepsis without septic shock: Secondary | ICD-10-CM

## 2023-05-19 DIAGNOSIS — L03319 Cellulitis of trunk, unspecified: Secondary | ICD-10-CM

## 2023-05-19 DIAGNOSIS — E7800 Pure hypercholesterolemia, unspecified: Secondary | ICD-10-CM | POA: Diagnosis present

## 2023-05-19 DIAGNOSIS — J869 Pyothorax without fistula: Secondary | ICD-10-CM | POA: Diagnosis not present

## 2023-05-19 DIAGNOSIS — B9561 Methicillin susceptible Staphylococcus aureus infection as the cause of diseases classified elsewhere: Secondary | ICD-10-CM

## 2023-05-19 DIAGNOSIS — J9611 Chronic respiratory failure with hypoxia: Secondary | ICD-10-CM | POA: Diagnosis present

## 2023-05-19 DIAGNOSIS — N179 Acute kidney failure, unspecified: Secondary | ICD-10-CM

## 2023-05-19 DIAGNOSIS — Z8673 Personal history of transient ischemic attack (TIA), and cerebral infarction without residual deficits: Secondary | ICD-10-CM | POA: Diagnosis not present

## 2023-05-19 DIAGNOSIS — G40909 Epilepsy, unspecified, not intractable, without status epilepticus: Secondary | ICD-10-CM | POA: Diagnosis present

## 2023-05-19 DIAGNOSIS — Z7982 Long term (current) use of aspirin: Secondary | ICD-10-CM

## 2023-05-19 DIAGNOSIS — A419 Sepsis, unspecified organism: Secondary | ICD-10-CM

## 2023-05-19 DIAGNOSIS — L02413 Cutaneous abscess of right upper limb: Secondary | ICD-10-CM | POA: Diagnosis present

## 2023-05-19 DIAGNOSIS — Z89612 Acquired absence of left leg above knee: Secondary | ICD-10-CM | POA: Diagnosis not present

## 2023-05-19 DIAGNOSIS — Z833 Family history of diabetes mellitus: Secondary | ICD-10-CM

## 2023-05-19 DIAGNOSIS — Z825 Family history of asthma and other chronic lower respiratory diseases: Secondary | ICD-10-CM

## 2023-05-19 DIAGNOSIS — L02212 Cutaneous abscess of back [any part, except buttock]: Secondary | ICD-10-CM | POA: Diagnosis present

## 2023-05-19 DIAGNOSIS — Z888 Allergy status to other drugs, medicaments and biological substances status: Secondary | ICD-10-CM | POA: Diagnosis not present

## 2023-05-19 DIAGNOSIS — F17200 Nicotine dependence, unspecified, uncomplicated: Secondary | ICD-10-CM

## 2023-05-19 DIAGNOSIS — Z5982 Transportation insecurity: Secondary | ICD-10-CM

## 2023-05-19 DIAGNOSIS — Z8711 Personal history of peptic ulcer disease: Secondary | ICD-10-CM

## 2023-05-19 DIAGNOSIS — E119 Type 2 diabetes mellitus without complications: Secondary | ICD-10-CM | POA: Diagnosis present

## 2023-05-19 DIAGNOSIS — I110 Hypertensive heart disease with heart failure: Secondary | ICD-10-CM | POA: Diagnosis present

## 2023-05-19 DIAGNOSIS — Z794 Long term (current) use of insulin: Secondary | ICD-10-CM

## 2023-05-19 DIAGNOSIS — K219 Gastro-esophageal reflux disease without esophagitis: Secondary | ICD-10-CM | POA: Diagnosis present

## 2023-05-19 DIAGNOSIS — E1165 Type 2 diabetes mellitus with hyperglycemia: Secondary | ICD-10-CM

## 2023-05-19 LAB — COMPREHENSIVE METABOLIC PANEL
ALT: 9 U/L (ref 0–44)
AST: 7 U/L — ABNORMAL LOW (ref 15–41)
Albumin: 2.2 g/dL — ABNORMAL LOW (ref 3.5–5.0)
Alkaline Phosphatase: 108 U/L (ref 38–126)
Anion gap: 13 (ref 5–15)
BUN: 12 mg/dL (ref 8–23)
CO2: 23 mmol/L (ref 22–32)
Calcium: 8.3 mg/dL — ABNORMAL LOW (ref 8.9–10.3)
Chloride: 91 mmol/L — ABNORMAL LOW (ref 98–111)
Creatinine, Ser: 1.62 mg/dL — ABNORMAL HIGH (ref 0.61–1.24)
GFR, Estimated: 48 mL/min — ABNORMAL LOW (ref >60)
Glucose, Bld: 312 mg/dL — ABNORMAL HIGH (ref 70–99)
Potassium: 3.7 mmol/L (ref 3.5–5.1)
Sodium: 127 mmol/L — ABNORMAL LOW (ref 135–145)
Total Bilirubin: 0.6 mg/dL (ref 0.3–1.2)
Total Protein: 7.2 g/dL (ref 6.5–8.1)

## 2023-05-19 LAB — URINALYSIS, W/ REFLEX TO CULTURE (INFECTION SUSPECTED)
Bilirubin Urine: NEGATIVE
Glucose, UA: >=500 mg/dL — AB
Ketones, ur: 5 mg/dL — AB
Leukocytes,Ua: NEGATIVE
Nitrite: NEGATIVE
Protein, ur: >=300 mg/dL — AB
Specific Gravity, Urine: 1.023 (ref 1.005–1.030)
pH: 5 (ref 5.0–8.0)

## 2023-05-19 LAB — CBC WITH DIFFERENTIAL/PLATELET
Abs Immature Granulocytes: 0.14 10*3/uL — ABNORMAL HIGH (ref 0.00–0.07)
Basophils Absolute: 0.1 10*3/uL (ref 0.0–0.1)
Basophils Relative: 0 %
Eosinophils Absolute: 0.2 10*3/uL (ref 0.0–0.5)
Eosinophils Relative: 1 %
HCT: 39.6 % (ref 39.0–52.0)
Hemoglobin: 13 g/dL (ref 13.0–17.0)
Immature Granulocytes: 1 %
Lymphocytes Relative: 12 %
Lymphs Abs: 1.7 10*3/uL (ref 0.7–4.0)
MCH: 28 pg (ref 26.0–34.0)
MCHC: 32.8 g/dL (ref 30.0–36.0)
MCV: 85.3 fL (ref 80.0–100.0)
Monocytes Absolute: 1.1 10*3/uL — ABNORMAL HIGH (ref 0.1–1.0)
Monocytes Relative: 8 %
Neutro Abs: 11.5 10*3/uL — ABNORMAL HIGH (ref 1.7–7.7)
Neutrophils Relative %: 78 %
Platelets: 273 10*3/uL (ref 150–400)
RBC: 4.64 MIL/uL (ref 4.22–5.81)
RDW: 12.8 % (ref 11.5–15.5)
WBC: 14.7 10*3/uL — ABNORMAL HIGH (ref 4.0–10.5)
nRBC: 0 % (ref 0.0–0.2)

## 2023-05-19 LAB — PROTIME-INR
INR: 1 (ref 0.8–1.2)
Prothrombin Time: 13.2 seconds (ref 11.4–15.2)

## 2023-05-19 LAB — CBG MONITORING, ED: Glucose-Capillary: 268 mg/dL — ABNORMAL HIGH (ref 70–99)

## 2023-05-19 LAB — APTT: aPTT: 32 seconds (ref 24–36)

## 2023-05-19 LAB — LACTIC ACID, PLASMA: Lactic Acid, Venous: 1 mmol/L (ref 0.5–1.9)

## 2023-05-19 LAB — AEROBIC CULTURE W GRAM STAIN (SUPERFICIAL SPECIMEN)

## 2023-05-19 MED ORDER — LACTATED RINGERS IV BOLUS
1000.0000 mL | Freq: Once | INTRAVENOUS | Status: AC
  Administered 2023-05-19: 1000 mL via INTRAVENOUS

## 2023-05-19 MED ORDER — ACETAMINOPHEN 650 MG RE SUPP: 650.0000 mg | Freq: Four times a day (QID) | RECTAL | Status: DC | PRN

## 2023-05-19 MED ORDER — FENTANYL CITRATE PF 50 MCG/ML IJ SOSY
50.0000 ug | PREFILLED_SYRINGE | Freq: Once | INTRAMUSCULAR | Status: AC
  Administered 2023-05-19: 50 ug via INTRAVENOUS
  Filled 2023-05-19: qty 1

## 2023-05-19 MED ORDER — OXYCODONE HCL 5 MG PO TABS
5.0000 mg | ORAL_TABLET | ORAL | Status: DC | PRN
  Administered 2023-05-20 – 2023-05-25 (×8): 5 mg via ORAL
  Filled 2023-05-19 (×8): qty 1

## 2023-05-19 MED ORDER — VANCOMYCIN HCL 2000 MG/400ML IV SOLN
2000.0000 mg | Freq: Once | INTRAVENOUS | Status: AC
  Administered 2023-05-19: 2000 mg via INTRAVENOUS
  Filled 2023-05-19: qty 400

## 2023-05-19 MED ORDER — ATORVASTATIN CALCIUM 40 MG PO TABS
40.0000 mg | ORAL_TABLET | Freq: Every day | ORAL | Status: DC
  Administered 2023-05-20 – 2023-05-25 (×6): 40 mg via ORAL
  Filled 2023-05-19 (×6): qty 1

## 2023-05-19 MED ORDER — INSULIN ASPART 100 UNIT/ML IJ SOLN
0.0000 [IU] | Freq: Three times a day (TID) | INTRAMUSCULAR | Status: DC
  Administered 2023-05-20: 5 [IU] via SUBCUTANEOUS
  Administered 2023-05-20: 3 [IU] via SUBCUTANEOUS
  Administered 2023-05-20: 5 [IU] via SUBCUTANEOUS
  Administered 2023-05-21 (×2): 3 [IU] via SUBCUTANEOUS
  Administered 2023-05-21: 2 [IU] via SUBCUTANEOUS
  Administered 2023-05-22: 15 [IU] via SUBCUTANEOUS

## 2023-05-19 MED ORDER — IPRATROPIUM-ALBUTEROL 0.5-2.5 (3) MG/3ML IN SOLN: 3.0000 mL | RESPIRATORY_TRACT | Status: DC | PRN

## 2023-05-19 MED ORDER — SODIUM CHLORIDE 0.9 % IV SOLN
2.0000 g | INTRAVENOUS | Status: DC
  Administered 2023-05-20: 2 g via INTRAVENOUS
  Filled 2023-05-19: qty 20

## 2023-05-19 MED ORDER — SENNOSIDES-DOCUSATE SODIUM 8.6-50 MG PO TABS
1.0000 | ORAL_TABLET | Freq: Every evening | ORAL | Status: DC | PRN
  Administered 2023-05-22: 1 via ORAL
  Filled 2023-05-19 (×2): qty 1

## 2023-05-19 MED ORDER — SODIUM CHLORIDE 0.9% FLUSH
3.0000 mL | Freq: Two times a day (BID) | INTRAVENOUS | Status: DC
  Administered 2023-05-20 – 2023-05-25 (×10): 3 mL via INTRAVENOUS

## 2023-05-19 MED ORDER — METOPROLOL SUCCINATE ER 25 MG PO TB24
12.5000 mg | ORAL_TABLET | Freq: Every day | ORAL | Status: DC
  Administered 2023-05-20 – 2023-05-25 (×6): 12.5 mg via ORAL
  Filled 2023-05-19 (×6): qty 1

## 2023-05-19 MED ORDER — SODIUM CHLORIDE 0.9 % IV SOLN
2.0000 g | Freq: Once | INTRAVENOUS | Status: AC
  Administered 2023-05-19: 2 g via INTRAVENOUS
  Filled 2023-05-19: qty 12.5

## 2023-05-19 MED ORDER — HEPARIN SODIUM (PORCINE) 5000 UNIT/ML IJ SOLN
5000.0000 [IU] | Freq: Three times a day (TID) | INTRAMUSCULAR | Status: DC
  Administered 2023-05-19 – 2023-05-25 (×16): 5000 [IU] via SUBCUTANEOUS
  Filled 2023-05-19 (×16): qty 1

## 2023-05-19 MED ORDER — SODIUM CHLORIDE 0.9 % IV SOLN: INTRAVENOUS | Status: AC

## 2023-05-19 MED ORDER — VANCOMYCIN HCL 1750 MG/350ML IV SOLN: 1750.0000 mg | INTRAVENOUS | Status: DC

## 2023-05-19 MED ORDER — ACETAMINOPHEN 500 MG PO TABS
1000.0000 mg | ORAL_TABLET | ORAL | Status: AC
  Administered 2023-05-19: 1000 mg via ORAL
  Filled 2023-05-19: qty 2

## 2023-05-19 MED ORDER — ACETAMINOPHEN 325 MG PO TABS
650.0000 mg | ORAL_TABLET | Freq: Four times a day (QID) | ORAL | Status: DC | PRN
  Administered 2023-05-20 – 2023-05-21 (×2): 650 mg via ORAL
  Filled 2023-05-19 (×2): qty 2

## 2023-05-19 MED ORDER — ISOSORBIDE MONONITRATE ER 30 MG PO TB24
30.0000 mg | ORAL_TABLET | Freq: Every day | ORAL | Status: DC
  Administered 2023-05-20 – 2023-05-25 (×6): 30 mg via ORAL
  Filled 2023-05-19 (×6): qty 1

## 2023-05-19 MED ORDER — HYDROMORPHONE HCL 1 MG/ML IJ SOLN
0.5000 mg | INTRAMUSCULAR | Status: DC | PRN
  Administered 2023-05-20: 0.5 mg via INTRAVENOUS
  Filled 2023-05-19: qty 0.5

## 2023-05-19 MED ORDER — ONDANSETRON HCL 4 MG/2ML IJ SOLN
4.0000 mg | Freq: Four times a day (QID) | INTRAMUSCULAR | Status: DC | PRN
  Administered 2023-05-20 – 2023-05-25 (×4): 4 mg via INTRAVENOUS
  Filled 2023-05-19 (×3): qty 2

## 2023-05-19 MED ORDER — LIDOCAINE-EPINEPHRINE (PF) 2 %-1:200000 IJ SOLN
20.0000 mL | Freq: Once | INTRAMUSCULAR | Status: AC
  Administered 2023-05-19: 20 mL
  Filled 2023-05-19: qty 20

## 2023-05-19 MED ORDER — INSULIN ASPART 100 UNIT/ML IJ SOLN
0.0000 [IU] | Freq: Every day | INTRAMUSCULAR | Status: DC
  Administered 2023-05-19: 4 [IU] via SUBCUTANEOUS
  Administered 2023-05-20: 2 [IU] via SUBCUTANEOUS
  Administered 2023-05-21: 5 [IU] via SUBCUTANEOUS

## 2023-05-19 MED ORDER — INSULIN GLARGINE-YFGN 100 UNIT/ML ~~LOC~~ SOLN
25.0000 [IU] | Freq: Two times a day (BID) | SUBCUTANEOUS | Status: DC
  Administered 2023-05-19 – 2023-05-22 (×6): 25 [IU] via SUBCUTANEOUS
  Filled 2023-05-19 (×8): qty 0.25

## 2023-05-19 MED ORDER — ASPIRIN 81 MG PO TBEC
81.0000 mg | DELAYED_RELEASE_TABLET | Freq: Every day | ORAL | Status: DC
  Administered 2023-05-20 – 2023-05-25 (×6): 81 mg via ORAL
  Filled 2023-05-19 (×6): qty 1

## 2023-05-19 MED ORDER — ONDANSETRON HCL 4 MG PO TABS: 4.0000 mg | ORAL_TABLET | Freq: Four times a day (QID) | ORAL | Status: DC | PRN
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
## 2023-05-19 NOTE — ED Provider Notes (Addendum)
[place] EMERGENCY DEPARTMENT AT [facility] Provider Note   CSN: [id number] Arrival date & time: 05/19/23  1833     History  Chief Complaint  Patient presents with   Abscess    Samuel Bailey is a 62 y.o. male.   Abscess Patient is a 62 year old male who has poorly controlled diabetes, HTN, seizure disorder, CAD, COPD, tobacco use disorder, HLD, diabetic neuropathy today, left BKA patient is present emergency room today with complaints of 2-week long worsening pain to his right posterior thorax.  Has a large wound here that has been progressively worsening.  He denies any nausea or vomiting.  He states that he has had no appetite over this period of time and over the past few days he has had no food because of persistently feeling unwell  He has not taken any Tylenol or Motrin since yesterday.       Home Medications Prior to Admission medications   Medication Sig Start Date End Date Taking? Authorizing Provider  aspirin 81 MG EC tablet Take 1 tablet (81 mg total) by mouth daily. 03/29/21   Tobb, Kardie, DO  atorvastatin (LIPITOR) 40 MG tablet Take 1 tablet (40 mg total) by mouth daily. 03/29/21   Tobb, Kardie, DO  B-D ULTRAFINE III SHORT PEN 31G X 8 MM MISC Inject into the skin daily. 05/31/21   [provider]  DALIRESP 500 MCG TABS tablet Take 500 mcg by mouth daily. 11/21/20   [provider]  DUREZOL 0.05 % EMUL START AFTER SURGERY, INSTILL 1 DROP IN LEFT EYE DAILY FOR 1 MONTH 01/26/21   [provider]  ezetimibe (ZETIA) 10 MG tablet Take 1 tablet (10 mg total) by mouth daily. 03/29/21   Tobb, Kardie, DO  Fenofibrate 150 MG CAPS Take 1 capsule (150 mg total) by mouth daily. 11/09/20   Tobb, Kardie, DO  FLUoxetine (PROZAC) 20 MG capsule Take 20 mg by mouth daily. 01/11/21   [provider]  furosemide (LASIX) 20 MG tablet Take 1 tablet (20 mg total) by mouth 2 (two) times a week. Please take this on Tuesday and Saturday 03/29/21  06/27/21  Tobb, Kardie, DO  gabapentin (NEURONTIN) 300 MG capsule Take 300 mg by mouth 3 (three) times daily.     [provider]  insulin aspart (NOVOLOG) 100 UNIT/ML injection Inject 3-10 Units into the skin 3 (three) times daily with meals. Per sliding scale    [provider]  insulin glargine (LANTUS) 100 unit/mL SOPN Inject 70 Units into the skin 2 (two) times daily. 12/14/20   Duke, Roe Rutherford, PA  isosorbide mononitrate (IMDUR) 30 MG 24 hr tablet Take 1 tablet (30 mg total) by mouth daily. 03/29/21   Tobb, Kardie, DO  losartan (COZAAR) 25 MG tablet TAKE 1/2 TABLET (12.5 MG TOTAL) BY MOUTH DAILY. 03/29/21 03/29/22  Tobb, Kardie, DO  metoprolol succinate (TOPROL-XL) 25 MG 24 hr tablet TAKE 1/2 TABLET (12.5 MG TOTAL) BY MOUTH DAILY. 03/29/21 03/29/22  Tobb, Kardie, DO  nitroGLYCERIN (NITROSTAT) 0.4 MG SL tablet PLACE 1 TABLET (0.4 MG TOTAL) UNDER THE TONGUE EVERY FIVE MINUTES X 3 DOSES AS NEEDED FOR CHEST PAIN. 03/29/21 03/29/22  Tobb, Kardie, DO  ofloxacin (OCUFLOX) 0.3 % ophthalmic solution START AFTER SURGERY, INTILL 1 DROP IN LEFT EYE 4 TIMES DIALY FOR 1 WEEK THEN STOP 01/26/21   [provider]  omeprazole (PRILOSEC) 40 MG capsule Take 40 mg by mouth in the morning and at bedtime.     [provider]  potassium chloride SA (KLOR-CON) 20 MEQ tablet Take 1 tablet (20 mEq total) by mouth 2 (two) times daily. 03/29/21   Tobb, Kardie, DO  ranolazine (RANEXA) 500 MG 12 hr tablet TAKE 1 TABLET BY MOUTH TWICE A DAY 04/25/22   Tobb, Kardie, DO  sulfamethoxazole-trimethoprim (BACTRIM) 400-80 MG tablet Take 1 tablet by mouth 2 (two) times daily. 06/01/21   Asencion Islam, DPM  tiotropium (SPIRIVA) 18 MCG inhalation capsule Place 18 mcg into inhaler and inhale daily.    [provider]      Allergies    Lovastatin    Review of Systems   Review of Systems  Physical Exam Updated Vital Signs BP 121/73   Pulse (!) 112   Temp 99.4 F (37.4 C) (Oral)   Resp (!)  21   Ht 5\' 8"  (1.727 m)   Wt 99.8 kg   SpO2 95%   BMI 33.45 kg/m  Physical Exam Vitals and nursing note reviewed.  Constitutional:      General: He is not in acute distress.    Appearance: He is obese. He is ill-appearing.  HENT:     Head: Normocephalic and atraumatic.     Nose: Nose normal.     Mouth/Throat:     Mouth: Mucous membranes are dry.  Eyes:     General: No scleral icterus. Cardiovascular:     Rate and Rhythm: Regular rhythm. Tachycardia present.     Pulses: Normal pulses.     Heart sounds: Normal heart sounds.  Pulmonary:     Effort: Pulmonary effort is normal. No respiratory distress.     Breath sounds: No wheezing.  Abdominal:     Palpations: Abdomen is soft.     Tenderness: There is no abdominal tenderness.  Musculoskeletal:     Cervical back: Normal range of motion.     Comments: BKA, stump without cellulitic changes  Skin:    General: Skin is warm and dry.     Capillary Refill: Capillary refill takes less than 2 seconds.  Neurological:     Mental Status: He is alert. Mental status is at baseline.  Psychiatric:        Mood and Affect: Mood normal.        Behavior: Behavior normal.        ED Results / Procedures / Treatments   Labs (all labs ordered are listed, but only abnormal results are displayed) Labs Reviewed  COMPREHENSIVE METABOLIC PANEL - Abnormal; Notable for the following components:      Result Value   Sodium 127 (*)    Chloride 91 (*)    Glucose, Bld 312 (*)    Creatinine, Ser 1.62 (*)    Calcium 8.3 (*)    Albumin 2.2 (*)    AST 7 (*)    GFR, Estimated 48 (*)    All other components within normal limits  CBC WITH DIFFERENTIAL/PLATELET - Abnormal; Notable for the following components:   WBC 14.7 (*)    Neutro Abs 11.5 (*)    Monocytes Absolute 1.1 (*)    Abs Immature Granulocytes 0.14 (*)    All other components within normal limits  URINALYSIS, W/ REFLEX TO CULTURE (INFECTION SUSPECTED) - Abnormal; Notable for the  following components:   APPearance HAZY (*)    Glucose, UA >=500 (*)    Hgb urine dipstick SMALL (*)    Ketones, ur 5 (*)    Protein, ur >=300 (*)    Bacteria, UA RARE (*)  All other components within normal limits  CBG MONITORING, ED - Abnormal; Notable for the following components:   Glucose-Capillary 268 (*)    All other components within normal limits  AEROBIC CULTURE W GRAM STAIN (SUPERFICIAL SPECIMEN)  CULTURE, BLOOD (ROUTINE X 2)  CULTURE, BLOOD (ROUTINE X 2)  LACTIC ACID, PLASMA  PROTIME-INR  APTT    EKG EKG Interpretation  Date/Time:  Friday May 19 2023 18:57:17 EDT Ventricular Rate:  117 PR Interval:  162 QRS Duration: 84 QT Interval:  332 QTC Calculation: 463 R Axis:   10 Text Interpretation: Sinus tachycardia Minimal voltage criteria for LVH, may be normal variant ( R in aVL ) Borderline ECG No significant change since last tracing Since last tracing rate faster Otherwise no significant change Confirmed by Melene Plan [phone]) on 05/19/2023 7:00:31 PM  Radiology DG Chest Port 1 View  Result Date: 05/19/2023 CLINICAL DATA:  Question of sepsis EXAM: PORTABLE CHEST 1 VIEW COMPARISON:  07/14/2022 FINDINGS: Shallow inspiration. Heart size and pulmonary vascularity are normal for technique. Lungs are clear. No pleural effusions. No pneumothorax. Mediastinal contours appear intact. Postoperative changes in the left shoulder. IMPRESSION: No active disease. Electronically Signed   By: Burman Nieves M.D.   On: 05/19/2023 19:43    Procedures .Critical Care  Performed by: Gailen Shelter, PA Authorized by: Gailen Shelter, PA   Critical care provider statement:    Critical care time (minutes):  35   Critical care time was exclusive of:  Separately billable procedures and treating other patients and teaching time   Critical care was necessary to treat or prevent imminent or life-threatening deterioration of the following conditions:  Sepsis   Critical care was time  spent personally by me on the following activities:  Development of treatment plan with patient or surrogate, review of old charts, re-evaluation of patient's condition, pulse oximetry, ordering and review of radiographic studies, ordering and review of laboratory studies, ordering and performing treatments and interventions, obtaining history from patient or surrogate, examination of patient and evaluation of patient's response to treatment   Care discussed with: admitting provider   .Marland KitchenIncision and Drainage  Date/Time: 05/19/2023 11:12 PM  Performed by: Gailen Shelter, PA Authorized by: Gailen Shelter, PA   Consent:    Consent obtained:  Verbal   Consent given by:  Patient   Risks discussed:  Bleeding, incomplete drainage, pain and damage to other organs   Alternatives discussed:  No treatment Universal protocol:    Procedure explained and questions answered to patient or proxy's satisfaction: yes     Relevant documents present and verified: yes     Test results available : yes     Imaging studies available: yes     Required blood products, implants, devices, and special equipment available: yes     Site/side marked: yes     Immediately prior to procedure, a time out was called: yes     Patient identity confirmed:  Verbally with patient Location:    Type:  Abscess   Size:  5x5 cm   Location:  Trunk   Trunk location:  Back Pre-procedure details:    Skin preparation:  Betadine and chlorhexidine Anesthesia:    Anesthesia method:  Local infiltration   Local anesthetic:  Lidocaine 2% WITH epi Procedure type:    Complexity:  Complex Procedure details:    Incision types:  Cruciate   Incision depth:  Subcutaneous   Wound management:  Probed and deloculated, irrigated with saline and  extensive cleaning   Drainage:  Purulent   Drainage amount:  Copious   Packing materials:  1/4 in iodoform gauze Post-procedure details:    Procedure completion:  Tolerated well, no immediate  complications     Medications Ordered in ED Medications  acetaminophen (TYLENOL) tablet 1,000 mg (1,000 mg Oral Given 05/19/23 1937)  lactated ringers bolus 1,000 mL (0 mLs Intravenous Stopped 05/19/23 2049)  lidocaine-EPINEPHrine (XYLOCAINE W/EPI) 2 %-1:200000 (PF) injection 20 mL (20 mLs Infiltration Given 05/19/23 1941)  ceFEPIme (MAXIPIME) 2 g in sodium chloride 0.9 % 100 mL IVPB (0 g Intravenous Stopped 05/19/23 2020)  vancomycin (VANCOREADY) IVPB 2000 mg/400 mL (0 mg Intravenous Stopped 05/19/23 2220)  fentaNYL (SUBLIMAZE) injection 50 mcg (50 mcg Intravenous Given 05/19/23 2051)    ED Course/ Medical Decision Making/ A&P                             Medical Decision Making Risk OTC drugs. Prescription drug management. Decision regarding hospitalization.   This patient presents to the ED for concern of wound, this involves a number of treatment options, and is a complaint that carries with it a high risk of complications and morbidity.  I considered abscess, cellulitis, necrotizing fasciitis I think that this is most likely representing an abscess   Co morbidities: Discussed in HPI   Brief History:  Patient is a 62 year old male who has poorly controlled diabetes, HTN, seizure disorder, CAD, COPD, tobacco use disorder, HLD, diabetic neuropathy today, left BKA patient is present emergency room today with complaints of 2-week long worsening pain to his right posterior thorax.  Has a large wound here that has been progressively worsening.  He denies any nausea or vomiting.  He states that he has had no appetite over this period of time and over the past few days he has had no food because of persistently feeling unwell  He has not taken any Tylenol or Motrin since yesterday.    EMR reviewed including pt PMHx, past surgical history and past visits to ER.   See HPI for more details   Lab Tests:   I ordered and independently interpreted labs. Labs notable for CBC with  leukocytosis with left shift, CMP with hyponatremia, hyperglycemia, mild increase in creatinine from baseline, UA without evidence of acute infection  I personally obtained wound swab  Imaging Studies:  NAD. I personally reviewed all imaging studies and no acute abnormality found. I agree with radiology interpretation.    Cardiac Monitoring:  The patient was maintained on a cardiac monitor.  I personally viewed and interpreted the cardiac monitored which showed an underlying rhythm of: sinus tachycardia  EKG non-ischemic   Medicines ordered:  I ordered medication including vanc/cefepime, fentanyl, lidocaine inj for abscess  for pain/infection. Reevaluation of the patient after these medicines showed that the patient improved I have reviewed the patients home medicines and have made adjustments as needed   Critical Interventions:     Consults/Attending Physician    discussed with Dr. Antionette Char of hospitalist service who will admit  I discussed this case with my attending physician who cosigned this note including patient's presenting symptoms, physical exam, and planned diagnostics and interventions. Attending physician stated agreement with plan or made changes to plan which were implemented.   Attending physician assessed patient at bedside.   Reevaluation:  After the interventions noted above I re-evaluated patient and found that they have :improved   Social Determinants of Health:  The patient's social determinants of health were a factor in the care of this patient    Problem List / ED Course:  Patient has a large cutaneous abscess with redness swelling and indurated tissue he has systemic symptoms and is positive for sepsis criteria given abscess with cellulitis as source and tachypnea, tachycardia, hyperthermia and leukocytosis present.  Administered broad-spectrum antibiotics and admitted to the hospitalist service.   Dispostion:  After consideration of the  diagnostic results and the patients response to treatment, I feel that the patent would benefit from admission.    Final Clinical Impression(s) / ED Diagnoses Final diagnoses:  Thoracic abscess [facility])    Rx / DC Orders ED Discharge Orders     None         Gailen Shelter, [place] 05/19/23 2311    Gailen Shelter, [place] 05/19/23 2312    Jacalyn Lefevre, MD 05/19/23 2318

## 2023-05-19 NOTE — ED Provider Triage Note (Signed)
Emergency Medicine Provider Triage Evaluation Note  ELMAR SCHIEK , a 62 y.o. male  was evaluated in triage.  Pt complains of abscess. Started as an itch on right shoulder x2 week ago and has progressed to abscess 1 week ago. Endorses headaches, non-bloody vomiting x3 days, lightheadedness.  Review of Systems  Positive: abscess Negative: Fever, syncope, abdominal pain, chest pain, dyspnea  Physical Exam  BP (!) 125/98 (BP Location: Right Arm)   Pulse (!) 118   Temp 99.8 F (37.7 C) (Oral)   Resp 20   Ht 5\' 8"  (1.727 m)   Wt 99.8 kg   SpO2 100%   BMI 33.45 kg/m  Gen:   Awake, no distress   Resp:  Normal effort  MSK:   Moves extremities without difficulty  Other:    Medical Decision Making  Medically screening exam initiated at 6:47 PM.  Appropriate orders placed.  RYU ASEBEDO was informed that the remainder of the evaluation will be completed by another provider, this initial triage assessment does not replace that evaluation, and the importance of remaining in the ED until their evaluation is complete.  Abscess present on right back. Patient meeting SIRs criteria (101.17F and 118HR); obtaining CBC, CMP, LA, Blood Cultures. Recommended patient be moved to room as soon as possible.   Dorthy Cooler, [place] 05/19/23 1904

## 2023-05-19 NOTE — ED Triage Notes (Addendum)
Pt BIB EMS from home for abscess on R shoulder/mid back, started as an itch 1 week ago that then developed into abscess.  EMS VS: HR 112 96% 2L [place] at baseline 138/78 CBG 300

## 2023-05-19 NOTE — Progress Notes (Signed)
Pharmacy Antibiotic Note  Samuel Bailey is a 62 y.o. male for which pharmacy has been consulted for vancomycin dosing for  abscess .  Patient with a history of diabetes, HTN, seizure disorder, CAD, COPD, tobacco use disorder, HLD, diabetic neuropathy, left BKA .  SCr 1.62 - above baseline WBC 14.7; LA 1; T 99.4; HR 112; RR 21  Plan: Cefepime per MD Vancomycin 2000 mg once then 1750 mg q48hr (eAUC 502) unless change in renal function Trend WBC, Fever, Renal function F/u cultures, clinical course, WBC De-escalate when able Levels at steady state  Height: 5\' 8"  (172.7 cm) Weight: 99.8 kg (220 lb) IBW/kg (Calculated) : 68.4  Temp (24hrs), Avg:100.7 F (38.2 C), Min:99.8 F (37.7 C), Max:101.4 F (38.6 C)  Recent Labs  Lab 05/19/23 1905  WBC 14.7*    CrCl cannot be calculated (Patient's most recent lab result is older than the maximum 21 days allowed.).    Allergies  Allergen Reactions   Lovastatin     diarrhea   Microbiology results: Pending  Thank you for allowing pharmacy to be a part of this patient's care.  Delmar Landau, PharmD, BCPS 05/19/2023 7:53 PM ED Clinical Pharmacist -  [phone]

## 2023-05-20 ENCOUNTER — Inpatient Hospital Stay (HOSPITAL_COMMUNITY): Payer: Medicaid Other

## 2023-05-20 ENCOUNTER — Other Ambulatory Visit (HOSPITAL_COMMUNITY): Payer: Medicaid Other

## 2023-05-20 DIAGNOSIS — L03312 Cellulitis of back [any part except buttock]: Secondary | ICD-10-CM

## 2023-05-20 DIAGNOSIS — B9561 Methicillin susceptible Staphylococcus aureus infection as the cause of diseases classified elsewhere: Secondary | ICD-10-CM

## 2023-05-20 DIAGNOSIS — R7881 Bacteremia: Secondary | ICD-10-CM

## 2023-05-20 DIAGNOSIS — E1165 Type 2 diabetes mellitus with hyperglycemia: Secondary | ICD-10-CM | POA: Diagnosis not present

## 2023-05-20 DIAGNOSIS — J869 Pyothorax without fistula: Secondary | ICD-10-CM | POA: Diagnosis not present

## 2023-05-20 DIAGNOSIS — N179 Acute kidney failure, unspecified: Secondary | ICD-10-CM | POA: Diagnosis not present

## 2023-05-20 LAB — GLUCOSE, CAPILLARY
Glucose-Capillary: 154 mg/dL — ABNORMAL HIGH (ref 70–99)
Glucose-Capillary: 206 mg/dL — ABNORMAL HIGH (ref 70–99)
Glucose-Capillary: 201 mg/dL — ABNORMAL HIGH (ref 70–99)
Glucose-Capillary: 206 mg/dL — ABNORMAL HIGH (ref 70–99)

## 2023-05-20 LAB — BLOOD CULTURE ID PANEL (REFLEXED) - BCID2

## 2023-05-20 LAB — BASIC METABOLIC PANEL
Anion gap: 10 (ref 5–15)
BUN: 14 mg/dL (ref 8–23)
CO2: 21 mmol/L — ABNORMAL LOW (ref 22–32)
Calcium: 7.7 mg/dL — ABNORMAL LOW (ref 8.9–10.3)
Chloride: 97 mmol/L — ABNORMAL LOW (ref 98–111)
Creatinine, Ser: 1.68 mg/dL — ABNORMAL HIGH (ref 0.61–1.24)
GFR, Estimated: 46 mL/min — ABNORMAL LOW (ref >60)
Glucose, Bld: 187 mg/dL — ABNORMAL HIGH (ref 70–99)
Potassium: 3.3 mmol/L — ABNORMAL LOW (ref 3.5–5.1)
Sodium: 128 mmol/L — ABNORMAL LOW (ref 135–145)

## 2023-05-20 LAB — CBC
HCT: 33.7 % — ABNORMAL LOW (ref 39.0–52.0)
Hemoglobin: 11.5 g/dL — ABNORMAL LOW (ref 13.0–17.0)
MCH: 28.9 pg (ref 26.0–34.0)
MCHC: 34.1 g/dL (ref 30.0–36.0)
MCV: 84.7 fL (ref 80.0–100.0)
Platelets: 207 10*3/uL (ref 150–400)
RBC: 3.98 MIL/uL — ABNORMAL LOW (ref 4.22–5.81)
RDW: 12.8 % (ref 11.5–15.5)
WBC: 16.4 10*3/uL — ABNORMAL HIGH (ref 4.0–10.5)
nRBC: 0 % (ref 0.0–0.2)

## 2023-05-20 LAB — AEROBIC CULTURE W GRAM STAIN (SUPERFICIAL SPECIMEN)

## 2023-05-20 LAB — HIV ANTIBODY (ROUTINE TESTING W REFLEX): HIV Screen 4th Generation wRfx: NONREACTIVE

## 2023-05-20 LAB — CBG MONITORING, ED: Glucose-Capillary: 304 mg/dL — ABNORMAL HIGH (ref 70–99)

## 2023-05-20 MED ORDER — POTASSIUM CHLORIDE CRYS ER 20 MEQ PO TBCR
60.0000 meq | EXTENDED_RELEASE_TABLET | Freq: Once | ORAL | Status: AC
  Administered 2023-05-20: 60 meq via ORAL
  Filled 2023-05-20: qty 3

## 2023-05-20 MED ORDER — GADOBUTROL 1 MMOL/ML IV SOLN
10.0000 mL | Freq: Once | INTRAVENOUS | Status: AC | PRN
  Administered 2023-05-20: 10 mL via INTRAVENOUS

## 2023-05-20 MED ORDER — CEFAZOLIN SODIUM-DEXTROSE 2-4 GM/100ML-% IV SOLN
2.0000 g | Freq: Three times a day (TID) | INTRAVENOUS | Status: DC
  Administered 2023-05-20 – 2023-05-25 (×15): 2 g via INTRAVENOUS
  Filled 2023-05-20 (×14): qty 100

## 2023-05-20 NOTE — Consult Note (Signed)
[facility]    Date of Admission:  05/19/2023     Reason for Consult: MSSA bacteremia     Referring Physician: Candy Sledge auto consult  Current antibiotics: Vancomycin Ceftriaxone   ASSESSMENT:    62 y.o. male admitted with:  Cellulitis and abscess of back with secondary MSSA bacteremia: Patient presenting with sepsis due to back cellulitis/abscess and found to have MSSA bacteremia with positive blood cultures from 05/19/23 and BC ID noting MSSA. Lumbar back pain: History of lumbar degenerative disc disease per patient.  He reports worsening low back pain over the past week raising concern for infection in the setting of bacteremia. Acute kidney injury: Patient presenting with serum creatinine 1.6 on admission in the setting of sepsis. Poorly controlled type 2 diabetes: Previous A1c was 12.5.  Repeat measurement pending.  He reports his sugars at home have been around 300.  RECOMMENDATIONS:    Change vancomycin and ceftriaxone to cefazolin 2 g every 8 hours for targeted MSSA coverage MRI lumbar spine for further evaluation of infection Recommend general surgery consult to see if incision and drainage in the OR is warranted for his abscess TTE Repeat blood cultures tomorrow Wound care Glycemic control Following   Principal Problem:   Cellulitis and abscess of trunk Active Problems:   Uncontrolled type 2 diabetes mellitus with hyperglycemia (HCC)   COPD with chronic bronchitis (HCC)   Mild CAD   HTN (hypertension)   Severe sepsis (HCC)   AKI (acute kidney injury) (HCC)   Chronic respiratory failure with hypoxia (HCC)   Hyponatremia   MEDICATIONS:    Scheduled Meds:  aspirin EC  81 mg Oral Daily   atorvastatin  40 mg Oral Daily   heparin  5,000 Units Subcutaneous Q8H   insulin aspart  0-15 Units Subcutaneous TID WC   insulin aspart  0-5 Units Subcutaneous QHS   insulin glargine-yfgn  25 Units Subcutaneous BID   isosorbide mononitrate  30  mg Oral Daily   metoprolol succinate  12.5 mg Oral Daily   sodium chloride flush  3 mL Intravenous Q12H   Continuous Infusions:  sodium chloride 100 mL/hr at 05/20/23 1048   cefTRIAXone (ROCEPHIN)  IV 2 g (05/20/23 0831)   [START ON 05/21/2023] vancomycin     PRN Meds:.acetaminophen **OR** acetaminophen, HYDROmorphone (DILAUDID) injection, ipratropium-albuterol, ondansetron **OR** ondansetron (ZOFRAN) IV, oxyCODONE, senna-docusate  HPI:    Samuel Bailey is a 62 y.o. male with past medical history notable for uncontrolled type 2 diabetes, CAD, PAD, diabetic foot infection status post left AKA presenting to the emergency department last night with malaise, decreased appetite, and an abscess on his back that had progressively worsened for the past 2 weeks.  In the emergency department, he was noted to be febrile with tachycardia.  He had leukocytosis and hyperglycemia.  He was given IV fluids and started on broad-spectrum antibiotics after blood cultures were obtained.  His blood cultures have now returned positive for MSSA and we are automatically consulted for antibiotic recommendations.  There was also a superficial wound culture obtained yesterday evening which is showing gram-positive cocci on the Gram stain.  Patient reports today that the abscess on his back is tender and has been draining.  He also reports worsening of his lumbar back pain.   Past Medical History:  Diagnosis Date   Anxiety    Blister of toe of right foot 12/02/2019   Candidal intertrigo    Cellulitis of right foot 10/31/2019  Chest pain 10/31/2019   Chronic headaches    Chronic pain    COPD with chronic bronchitis 02/25/2015   03/23/2015 PFTs:  FeV1 101% Fvc 105%  Good response to BDs     Diabetic neuropathy (HCC) 12/02/2019   Generalized weakness 12/02/2019   GERD (gastroesophageal reflux disease)    High cholesterol    History of CVA (cerebrovascular accident) 12/02/2019   History of peptic ulcer 12/02/2019   HTN  (hypertension)    Hyperlipemia    Hyperlipidemia    Irreducible hernia of anterior abdominal wall    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   Lumbar radiculopathy    Marijuana smoker 12/02/2019   Mild CAD 02/25/2015   Heart cath 12/2014 [facility]:  Mid LAD 30% Mid RCA 30% nL LVEF>>> risk factor modification only    Obesity (BMI 30-39.9) 12/02/2019   Peripheral neuropathy    Poorly controlled diabetes mellitus (HCC) 12/02/2019   Right hip pain    Sciatica of right side    Seizure disorder (HCC)    SIRS (systemic inflammatory response syndrome) (HCC) 12/02/2019   Syncope and collapse 12/02/2019   Tobacco use disorder 02/24/2015   Uncontrolled type 2 diabetes with neuropathy    Unstable angina (HCC) 12/02/2019   Wrist pain     Social History   Tobacco Use   Smoking status: Former    Packs/day: 1.00    Years: 35.00    Additional pack years: 0.00    Total pack years: 35.00    Types: Cigarettes    Quit date: 02/19/2015    Years since quitting: 8.2   Smokeless tobacco: Current  Vaping Use   Vaping Use: Never used  Substance Use Topics   Alcohol use: Yes    Alcohol/week: 6.0 standard drinks of alcohol    Types: 6 Standard drinks or equivalent per week   Drug use: Yes    Types: Marijuana    Family History  Problem Relation Age of Onset   Prostate cancer Father    Asthma Mother    Hypertension Mother    Diabetes Mother    CVA Mother    Heart disease Mother    Colon cancer Mother    Diabetes Sister    COPD Brother     Allergies  Allergen Reactions   Lovastatin     diarrhea    Review of Systems  All other systems reviewed and are negative. Except as otherwise noted above in the HPI. OBJECTIVE:   Blood pressure 120/69, pulse 99, temperature 98.3 F (36.8 C), resp. rate 18, height 5\' 8"  (1.727 m), weight 109.7 kg, SpO2 98 %. Body mass index is 36.77 kg/m.  Physical Exam Constitutional:      General: He is not in acute distress. HENT:     Head: Normocephalic and  atraumatic.  Eyes:     Extraocular Movements: Extraocular movements intact.     Conjunctiva/sclera: Conjunctivae normal.  Cardiovascular:     Rate and Rhythm: Normal rate and regular rhythm.  Pulmonary:     Effort: Pulmonary effort is normal. No respiratory distress.  Abdominal:     Palpations: Abdomen is soft.     Tenderness: There is no abdominal tenderness.  Musculoskeletal:     Cervical back: Normal range of motion and neck supple.     Comments: Status post left AKA Right lower extremity with chronic changes of PAD Right mid back with erythema and induration consistent with large abscess that is draining purulent fluid Midline lumbar spine tenderness to palpation  Neurological:     Mental Status: He is alert.      Lab Results: Lab Results  Component Value Date   WBC 16.4 (H) 05/20/2023   HGB 11.5 (L) 05/20/2023   HCT 33.7 (L) 05/20/2023   MCV 84.7 05/20/2023   PLT 207 05/20/2023    Lab Results  Component Value Date   NA 128 (L) 05/20/2023   K 3.3 (L) 05/20/2023   CO2 21 (L) 05/20/2023   GLUCOSE 187 (H) 05/20/2023   BUN 14 05/20/2023   CREATININE 1.68 (H) 05/20/2023   CALCIUM 7.7 (L) 05/20/2023   GFRNONAA 46 (L) 05/20/2023   GFRAA 125 12/28/2020    Lab Results  Component Value Date   ALT 9 05/19/2023   AST 7 (L) 05/19/2023   ALKPHOS 108 05/19/2023   BILITOT 0.6 05/19/2023       Component Value Date/Time   CRP 84 (H) 12/30/2021 1013       Component Value Date/Time   ESRSEDRATE 78 (H) 12/30/2021 1013    I have reviewed the micro and lab results in Epic.  Imaging: DG Chest Port 1 View  Result Date: 05/19/2023 CLINICAL DATA:  Question of sepsis EXAM: PORTABLE CHEST 1 VIEW COMPARISON:  07/14/2022 FINDINGS: Shallow inspiration. Heart size and pulmonary vascularity are normal for technique. Lungs are clear. No pleural effusions. No pneumothorax. Mediastinal contours appear intact. Postoperative changes in the left shoulder. IMPRESSION: No active disease.  Electronically Signed   By: Burman Nieves M.D.   On: 05/19/2023 19:43     Imaging independently reviewed in Epic.  Vedia Coffer for Infectious Disease [facility] [phone] pager 05/20/2023, 11:46 AM

## 2023-05-20 NOTE — Progress Notes (Signed)
PHARMACY - PHYSICIAN COMMUNICATION CRITICAL VALUE ALERT - BLOOD CULTURE IDENTIFICATION (BCID)  Samuel Bailey is an 62 y.o. male who presented to [facility] on 05/19/2023 with a chief complaint of Cellulitis  Assessment:  1 of 4 blood cultures with staph aureus (no resistance)  Name of physician (or Provider) Contacted: Dr. Rhona Leavens  Current antibiotics: Ceftriaxone + Vancomycin  Changes to prescribed antibiotics recommended:  De-escalate to cefazolin monotherapy. ID consulted.  Results for orders placed or performed during the hospital encounter of 05/19/23  Blood Culture ID Panel (Reflexed) (Collected: 05/19/2023  7:16 PM)  Result Value Ref Range   Enterococcus faecalis NOT DETECTED NOT DETECTED   Enterococcus Faecium NOT DETECTED NOT DETECTED   Listeria monocytogenes NOT DETECTED NOT DETECTED   Staphylococcus species DETECTED (A) NOT DETECTED   Staphylococcus aureus (BCID) DETECTED (A) NOT DETECTED   Staphylococcus epidermidis NOT DETECTED NOT DETECTED   Staphylococcus lugdunensis NOT DETECTED NOT DETECTED   Streptococcus species NOT DETECTED NOT DETECTED   Streptococcus agalactiae NOT DETECTED NOT DETECTED   Streptococcus pneumoniae NOT DETECTED NOT DETECTED   Streptococcus pyogenes NOT DETECTED NOT DETECTED   A.calcoaceticus-baumannii NOT DETECTED NOT DETECTED   Bacteroides fragilis NOT DETECTED NOT DETECTED   Enterobacterales NOT DETECTED NOT DETECTED   Enterobacter cloacae complex NOT DETECTED NOT DETECTED   Escherichia coli NOT DETECTED NOT DETECTED   Klebsiella aerogenes NOT DETECTED NOT DETECTED   Klebsiella oxytoca NOT DETECTED NOT DETECTED   Klebsiella pneumoniae NOT DETECTED NOT DETECTED   Proteus species NOT DETECTED NOT DETECTED   Salmonella species NOT DETECTED NOT DETECTED   Serratia marcescens NOT DETECTED NOT DETECTED   Haemophilus influenzae NOT DETECTED NOT DETECTED   Neisseria meningitidis NOT DETECTED NOT DETECTED   Pseudomonas aeruginosa NOT DETECTED NOT  DETECTED   Stenotrophomonas maltophilia NOT DETECTED NOT DETECTED   Candida albicans NOT DETECTED NOT DETECTED   Candida auris NOT DETECTED NOT DETECTED   Candida glabrata NOT DETECTED NOT DETECTED   Candida krusei NOT DETECTED NOT DETECTED   Candida parapsilosis NOT DETECTED NOT DETECTED   Candida tropicalis NOT DETECTED NOT DETECTED   Cryptococcus neoformans/gattii NOT DETECTED NOT DETECTED   Meth resistant mecA/C and MREJ NOT DETECTED NOT DETECTED    Ellis Savage, PharmD Clinical Pharmacist 05/20/2023  12:16 PM
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
## 2023-05-20 NOTE — Hospital Course (Signed)
62 y.o. 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   Patient reports that he developed pruritus involving his upper back on the right side roughly 2 weeks ago.  He noted a small tender nodule and surrounding erythema at that site which continued to enlarge.  He has had spontaneous purulent drainage
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
## 2023-05-20 NOTE — Progress Notes (Signed)
  Progress Note   Patient: Samuel Bailey ZOX:[id number] DOB: 05-24-1961 DOA: 05/19/2023     1 DOS: the patient was seen and examined on 05/20/2023   Brief hospital course: 62 y.o. 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   Patient reports that he developed pruritus involving his upper back on the right side roughly 2 weeks ago.  He noted a small tender nodule and surrounding erythema at that site which continued to enlarge.  He has had spontaneous purulent drainage  Assessment and Plan: 1. Severe sepsis d/t cellulitis and abscess present on admit - Abscess is s/p bedside I&D in ED; there is significant surrounding cellulitis  - Continue broad-spectrum antibiotics -Pt noted to be bacteremic with MSSA bacteremia -area of questionable tissue around the area of abscess. Have consulted General Surgery to see if pt would benefit from debridement to this area   2. AKI  - SCr is 1.62 on admission, up from 1.02 in July 2023   - Suspect this is acute in setting of sepsis and loss of appetite  - Hold Lasix and losartan, renally-dose medications -cont IVF -Recheck bmet in AM   3. Insulin-dependent DM  - A1c was 12.5% in January 2023  - cont semglee 25 units BID with SSI   4. COPD; chronic hypoxic respiratory failure  - Not in exacerbation on admission  - Continue supplemental O2, as-needed DuoNebs     5. Hypertension  - Hold losartan as above, continue metoprolol as tolerated   -BP stable and controlled   6. CAD  - No angina  - Continue ASA, Lipitor, and metoprolol    7. Chronic HFpEF - Appears hypovolemic on admission  - Hold Lasix and losartan in light of AKI, monitor weight and I/Os   8. Hyponatremia  -  Serum sodium corrects to 130 when accounting for hyperglycemia; this is in the setting of hypovolemia  - Continue IVF hydration -Na trending up slightly -Will check serum osm, urine osm, urine Na -recheck bmet in AM   9. Hx of CVA  - Continue ASA and Lipitor    Subjective: Complaining of pain around cellulitis area  Physical Exam: Vitals:   05/20/23 0500 05/20/23 0752 05/20/23 1500 05/20/23 1600  BP:  120/69  (!) 111/57  Pulse:  99  88  Resp:  18 (!) 22 (!) 21  Temp:  98.3 F (36.8 C)  99.6 F (37.6 C)  TempSrc:      SpO2:  98%  96%  Weight: 109.7 kg     Height:       General exam: Awake, laying in bed, in nad Respiratory system: Normal respiratory effort, no wheezing Cardiovascular system: regular rate, s1, s2 Gastrointestinal system: Soft, nondistended, positive BS Central nervous system: CN2-12 grossly intact, strength intact Extremities: Perfused, no clubbing Skin: Normal skin turgor, no notable skin lesions seen Psychiatry: Mood normal // no visual hallucinations   Data Reviewed:  Labs reviewed: Na 128, K 3.3, Cr 1.68, WBC 16.4  Family Communication: Pt in room, family not at bedside  Disposition: Status is: Inpatient Remains inpatient appropriate because: Severity of illness  Planned Discharge Destination: Home    Author: Rickey Barbara, MD 05/20/2023 6:20 PM  For on call review [email or web address].

## 2023-05-21 ENCOUNTER — Inpatient Hospital Stay (HOSPITAL_COMMUNITY): Payer: Medicaid Other

## 2023-05-21 ENCOUNTER — Other Ambulatory Visit: Payer: Self-pay

## 2023-05-21 ENCOUNTER — Encounter (HOSPITAL_COMMUNITY): Payer: Self-pay | Admitting: Family Medicine

## 2023-05-21 ENCOUNTER — Encounter (HOSPITAL_COMMUNITY)
Admission: EM | Disposition: A | Discharge status: Home Health | Payer: Self-pay | Source: Home / Self Care | Attending: Internal Medicine

## 2023-05-21 ENCOUNTER — Inpatient Hospital Stay (HOSPITAL_COMMUNITY): Payer: Medicaid Other | Admitting: Anesthesiology

## 2023-05-21 DIAGNOSIS — B9561 Methicillin susceptible Staphylococcus aureus infection as the cause of diseases classified elsewhere: Secondary | ICD-10-CM | POA: Diagnosis not present

## 2023-05-21 DIAGNOSIS — I10 Essential (primary) hypertension: Secondary | ICD-10-CM

## 2023-05-21 DIAGNOSIS — J869 Pyothorax without fistula: Secondary | ICD-10-CM | POA: Diagnosis not present

## 2023-05-21 DIAGNOSIS — Z87891 Personal history of nicotine dependence: Secondary | ICD-10-CM

## 2023-05-21 DIAGNOSIS — J449 Chronic obstructive pulmonary disease, unspecified: Secondary | ICD-10-CM

## 2023-05-21 DIAGNOSIS — R7881 Bacteremia: Secondary | ICD-10-CM | POA: Diagnosis not present

## 2023-05-21 DIAGNOSIS — L02212 Cutaneous abscess of back [any part, except buttock]: Secondary | ICD-10-CM | POA: Diagnosis not present

## 2023-05-21 DIAGNOSIS — Z794 Long term (current) use of insulin: Secondary | ICD-10-CM

## 2023-05-21 DIAGNOSIS — I2510 Atherosclerotic heart disease of native coronary artery without angina pectoris: Secondary | ICD-10-CM | POA: Diagnosis not present

## 2023-05-21 DIAGNOSIS — E1151 Type 2 diabetes mellitus with diabetic peripheral angiopathy without gangrene: Secondary | ICD-10-CM

## 2023-05-21 HISTORY — PX: IRRIGATION AND DEBRIDEMENT ABDOMEN: SHX6600

## 2023-05-21 LAB — AEROBIC CULTURE W GRAM STAIN (SUPERFICIAL SPECIMEN): Gram Stain: NONE SEEN

## 2023-05-21 LAB — CBC
HCT: 33 % — ABNORMAL LOW (ref 39.0–52.0)
Hemoglobin: 10.8 g/dL — ABNORMAL LOW (ref 13.0–17.0)
MCH: 28.6 pg (ref 26.0–34.0)
MCHC: 32.7 g/dL (ref 30.0–36.0)
MCV: 87.5 fL (ref 80.0–100.0)
Platelets: 213 10*3/uL (ref 150–400)
RBC: 3.77 MIL/uL — ABNORMAL LOW (ref 4.22–5.81)
RDW: 13 % (ref 11.5–15.5)
WBC: 9.1 10*3/uL (ref 4.0–10.5)
nRBC: 0 % (ref 0.0–0.2)

## 2023-05-21 LAB — GLUCOSE, CAPILLARY
Glucose-Capillary: 182 mg/dL — ABNORMAL HIGH (ref 70–99)
Glucose-Capillary: 151 mg/dL — ABNORMAL HIGH (ref 70–99)
Glucose-Capillary: 101 mg/dL — ABNORMAL HIGH (ref 70–99)
Glucose-Capillary: 145 mg/dL — ABNORMAL HIGH (ref 70–99)
Glucose-Capillary: 365 mg/dL — ABNORMAL HIGH (ref 70–99)

## 2023-05-21 LAB — BASIC METABOLIC PANEL
Anion gap: 13 (ref 5–15)
BUN: 13 mg/dL (ref 8–23)
CO2: 19 mmol/L — ABNORMAL LOW (ref 22–32)
Calcium: 8 mg/dL — ABNORMAL LOW (ref 8.9–10.3)
Chloride: 100 mmol/L (ref 98–111)
Creatinine, Ser: 1.58 mg/dL — ABNORMAL HIGH (ref 0.61–1.24)
GFR, Estimated: 49 mL/min — ABNORMAL LOW (ref >60)
Glucose, Bld: 202 mg/dL — ABNORMAL HIGH (ref 70–99)
Potassium: 3.8 mmol/L (ref 3.5–5.1)
Sodium: 132 mmol/L — ABNORMAL LOW (ref 135–145)

## 2023-05-21 LAB — SURGICAL PCR SCREEN
MRSA, PCR: NEGATIVE
Staphylococcus aureus: POSITIVE — AB

## 2023-05-21 LAB — AEROBIC/ANAEROBIC CULTURE W GRAM STAIN (SURGICAL/DEEP WOUND)

## 2023-05-21 SURGERY — IRRIGATION AND DEBRIDEMENT ABDOMEN
Anesthesia: General | Site: Back | Laterality: Right

## 2023-05-21 MED ORDER — INSULIN ASPART 100 UNIT/ML IJ SOLN: 0.0000 [IU] | INTRAMUSCULAR | Status: DC | PRN

## 2023-05-21 MED ORDER — OXYCODONE HCL 5 MG PO TABS: 5.0000 mg | ORAL_TABLET | Freq: Once | ORAL | Status: DC | PRN

## 2023-05-21 MED ORDER — PROMETHAZINE HCL 25 MG/ML IJ SOLN: 6.2500 mg | INTRAMUSCULAR | Status: DC | PRN

## 2023-05-21 MED ORDER — LIDOCAINE 2% (20 MG/ML) 5 ML SYRINGE
INTRAMUSCULAR | Status: DC | PRN
  Administered 2023-05-21: 40 mg via INTRAVENOUS

## 2023-05-21 MED ORDER — MEPERIDINE HCL 25 MG/ML IJ SOLN: 6.2500 mg | INTRAMUSCULAR | Status: DC | PRN

## 2023-05-21 MED ORDER — MIDAZOLAM HCL 2 MG/2ML IJ SOLN
INTRAMUSCULAR | Status: AC
  Filled 2023-05-21: qty 2

## 2023-05-21 MED ORDER — LACTATED RINGERS IV SOLN: INTRAVENOUS | Status: DC

## 2023-05-21 MED ORDER — LIDOCAINE 2% (20 MG/ML) 5 ML SYRINGE
INTRAMUSCULAR | Status: AC
  Filled 2023-05-21: qty 5

## 2023-05-21 MED ORDER — PROPOFOL 10 MG/ML IV BOLUS
INTRAVENOUS | Status: DC | PRN
  Administered 2023-05-21: 150 mg via INTRAVENOUS

## 2023-05-21 MED ORDER — MIDAZOLAM HCL 2 MG/2ML IJ SOLN: 0.5000 mg | Freq: Once | INTRAMUSCULAR | Status: DC | PRN

## 2023-05-21 MED ORDER — ONDANSETRON HCL 4 MG/2ML IJ SOLN
INTRAMUSCULAR | Status: AC
  Filled 2023-05-21: qty 2

## 2023-05-21 MED ORDER — FENTANYL CITRATE (PF) 250 MCG/5ML IJ SOLN
INTRAMUSCULAR | Status: DC | PRN
  Administered 2023-05-21 (×2): 50 ug via INTRAVENOUS

## 2023-05-21 MED ORDER — SUCCINYLCHOLINE CHLORIDE 200 MG/10ML IV SOSY
PREFILLED_SYRINGE | INTRAVENOUS | Status: AC
  Filled 2023-05-21: qty 10

## 2023-05-21 MED ORDER — CEFAZOLIN SODIUM 1 G IJ SOLR
INTRAMUSCULAR | Status: AC
  Filled 2023-05-21: qty 20

## 2023-05-21 MED ORDER — 0.9 % SODIUM CHLORIDE (POUR BTL) OPTIME
TOPICAL | Status: DC | PRN
  Administered 2023-05-21: 1000 mL

## 2023-05-21 MED ORDER — ORAL CARE MOUTH RINSE: 15.0000 mL | Freq: Once | OROMUCOSAL | Status: AC

## 2023-05-21 MED ORDER — CHLORHEXIDINE GLUCONATE CLOTH 2 % EX PADS
6.0000 | MEDICATED_PAD | Freq: Every day | CUTANEOUS | Status: AC
  Administered 2023-05-21 – 2023-05-25 (×5): 6 via TOPICAL

## 2023-05-21 MED ORDER — MUPIROCIN 2 % EX OINT
1.0000 | TOPICAL_OINTMENT | Freq: Two times a day (BID) | CUTANEOUS | Status: DC
  Administered 2023-05-21 – 2023-05-25 (×9): 1 via NASAL
  Filled 2023-05-21 (×2): qty 22

## 2023-05-21 MED ORDER — FENTANYL CITRATE (PF) 250 MCG/5ML IJ SOLN
INTRAMUSCULAR | Status: AC
  Filled 2023-05-21: qty 5

## 2023-05-21 MED ORDER — MIDAZOLAM HCL 2 MG/2ML IJ SOLN
INTRAMUSCULAR | Status: DC | PRN
  Administered 2023-05-21: 2 mg via INTRAVENOUS

## 2023-05-21 MED ORDER — ROCURONIUM BROMIDE 10 MG/ML (PF) SYRINGE
PREFILLED_SYRINGE | INTRAVENOUS | Status: DC | PRN
  Administered 2023-05-21: 50 mg via INTRAVENOUS

## 2023-05-21 MED ORDER — DEXAMETHASONE SODIUM PHOSPHATE 10 MG/ML IJ SOLN
INTRAMUSCULAR | Status: AC
  Filled 2023-05-21: qty 1

## 2023-05-21 MED ORDER — DEXAMETHASONE SODIUM PHOSPHATE 10 MG/ML IJ SOLN
INTRAMUSCULAR | Status: DC | PRN
  Administered 2023-05-21: 10 mg via INTRAVENOUS

## 2023-05-21 MED ORDER — OXYCODONE HCL 5 MG/5ML PO SOLN: 5.0000 mg | Freq: Once | ORAL | Status: DC | PRN

## 2023-05-21 MED ORDER — SUGAMMADEX SODIUM 200 MG/2ML IV SOLN
INTRAVENOUS | Status: DC | PRN
  Administered 2023-05-21: 400 mg via INTRAVENOUS

## 2023-05-21 MED ORDER — CHLORHEXIDINE GLUCONATE CLOTH 2 % EX PADS
6.0000 | MEDICATED_PAD | Freq: Every day | CUTANEOUS | Status: DC
  Administered 2023-05-21 – 2023-05-25 (×4): 6 via TOPICAL

## 2023-05-21 MED ORDER — HYDROMORPHONE HCL 1 MG/ML IJ SOLN: 0.2500 mg | INTRAMUSCULAR | Status: DC | PRN

## 2023-05-21 MED ORDER — CHLORHEXIDINE GLUCONATE 0.12 % MT SOLN
15.0000 mL | Freq: Once | OROMUCOSAL | Status: AC
  Administered 2023-05-21: 15 mL via OROMUCOSAL

## 2023-05-21 MED ORDER — ROCURONIUM BROMIDE 10 MG/ML (PF) SYRINGE
PREFILLED_SYRINGE | INTRAVENOUS | Status: AC
  Filled 2023-05-21: qty 10

## 2023-05-21 MED ORDER — PROPOFOL 10 MG/ML IV BOLUS
INTRAVENOUS | Status: AC
  Filled 2023-05-21: qty 20

## 2023-05-21 SURGICAL SUPPLY — 3 items
GAUZE PAD ABD 8X10 STRL (GAUZE/BANDAGES/DRESSINGS) IMPLANT
GAUZE SPONGE 4X4 12PLY STRL (GAUZE/BANDAGES/DRESSINGS) IMPLANT
TAPE CLOTH SURG 4X10 WHT LF (GAUZE/BANDAGES/DRESSINGS) IMPLANT

## 2023-05-21 NOTE — Progress Notes (Signed)
Echo attempted, patient is off the floor for I and D at this time. Will retry as schedule permits. [facility] Aritzel Krusemark RDCS

## 2023-05-21 NOTE — Interval H&P Note (Signed)
History and Physical Interval Note:  05/21/2023 12:52 PM  Samuel Bailey  has presented today for surgery, with the diagnosis of ABSCESS RIGHT UPPER BACK.  The various methods of treatment have been discussed with the patient and family. After consideration of risks, benefits and other options for treatment, the patient has consented to  Procedure(s): IRRIGATION AND DEBRIDEMENT RIGHT UPPER BACK (Right) as a surgical intervention.  The patient's history has been reviewed, patient examined, no change in status, stable for surgery.  I have reviewed the patient's chart and labs.  Questions were answered to the patient's satisfaction.     Daylin Eads Lollie Sails

## 2023-05-21 NOTE — Progress Notes (Signed)
  Progress Note   Patient: Samuel Bailey UEA:[id number] DOB: 02/21/1961 DOA: 05/19/2023     2 DOS: the patient was seen and examined on 05/21/2023   Brief hospital course: 62 y.o. 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   Patient reports that he developed pruritus involving his upper back on the right side roughly 2 weeks ago.  He noted a small tender nodule and surrounding erythema at that site which continued to enlarge.  He has had spontaneous purulent drainage  Assessment and Plan: 1. Severe sepsis d/t cellulitis and abscess present on admit - Abscess is s/p bedside I&D in ED; there is significant surrounding cellulitis  - Continue broad-spectrum antibiotics -Pt noted to be bacteremic with MSSA bacteremia -General Surgery was consulted regarding debridement of back abscess. Pt was taken to OR 6/2 where wound was debrided   2. AKI  - SCr is 1.62 on admission, up from 1.02 in July 2023   - Suspect this is acute in setting of sepsis and loss of appetite  -cont IVF with improvement in Cr -Recheck bmet in AM   3. Insulin-dependent DM  - A1c was 12.5% in January 2023  - cont semglee 25 units BID with SSI   4. COPD; chronic hypoxic respiratory failure  - Not in exacerbation on admission  - Continue supplemental O2, as-needed DuoNebs     5. Hypertension  - Hold losartan as above, continue metoprolol as tolerated   -BP stable and controlled   6. CAD  - No chest pain - Continue ASA, Lipitor, and metoprolol    7. Chronic HFpEF - Appears hypovolemic on admission  - Hold Lasix and losartan in light of AKI, monitor weight and I/Os   8. Hyponatremia  - Continue IVF hydration -Na trending up -recheck bmet  in AM   9. Hx of CVA  - Continue ASA and Lipitor    Subjective: Pt seen prior to surgery. Without complaints  Physical Exam: Vitals:   05/21/23 1415 05/21/23 1430 05/21/23 1445 05/21/23 1502  BP: 114/67 (!) 111/59 (!) 107/56 116/71  Pulse: 78 73 75 80  Resp: 10 18 17 16   Temp:   98.6 F (37 C)   TempSrc:      SpO2: 92% 97% 93% 91%  Weight:      Height:       General exam: Conversant, in no acute distress Respiratory system: normal chest rise, clear, no audible wheezing Cardiovascular system: regular rhythm, s1-s2 Gastrointestinal system: Nondistended, nontender, pos BS Central nervous system: No seizures, no tremors Extremities: No cyanosis, no joint deformities Skin: No rashes, dressings applied over R back Psychiatry: Affect normal // no auditory hallucinations   Data Reviewed:  Labs reviewed: Na 132, K 3.8, Cr 1.58, Hgb 10.8  Family Communication: Pt in room, family not at bedside  Disposition: Status is: Inpatient Remains inpatient appropriate because: Severity of illness  Planned Discharge Destination: Home    Author: Rickey Barbara, MD 05/21/2023 4:47 PM  For on call review [email or web address].

## 2023-05-21 NOTE — Anesthesia Postprocedure Evaluation (Signed)
Anesthesia Post Note  Patient: Samuel Bailey  Procedure(s) Performed: IRRIGATION AND DEBRIDEMENT RIGHT UPPER BACK (Right: Back)     Patient location during evaluation: PACU Anesthesia Type: General Level of consciousness: awake and alert, patient cooperative and oriented Pain management: pain level controlled Vital Signs Assessment: post-procedure vital signs reviewed and stable Respiratory status: spontaneous breathing, nonlabored ventilation and respiratory function stable Cardiovascular status: blood pressure returned to baseline and stable Postop Assessment: no apparent nausea or vomiting Anesthetic complications: no   No notable events documented.  Last Vitals:  Vitals:   05/21/23 0740 05/21/23 1208  BP: (!) 159/75 117/69  Pulse: 80 78  Resp: 17 20  Temp: 37 C 36.9 C  SpO2: 93% 95%    Last Pain:  Vitals:   05/21/23 1208  TempSrc: Oral  PainSc:                  Chalonda Schlatter,E. Hubert Raatz

## 2023-05-21 NOTE — Plan of Care (Signed)
  Problem: Fluid Volume: Goal: Hemodynamic stability will improve Outcome: Progressing   Problem: Clinical Measurements: Goal: Diagnostic test results will improve Outcome: Progressing   Problem: Metabolic: Goal: Ability to maintain appropriate glucose levels will improve Outcome: Progressing   Problem: Skin Integrity: Goal: Risk for impaired skin integrity will decrease Outcome: Progressing   Problem: Tissue Perfusion: Goal: Adequacy of tissue perfusion will improve Outcome: Progressing   Problem: Pain Managment: Goal: General experience of comfort will improve Outcome: Progressing

## 2023-05-21 NOTE — Anesthesia Preprocedure Evaluation (Addendum)
Anesthesia Evaluation  Patient identified by MRN, date of birth, ID band Patient awake    Reviewed: Allergy & Precautions, NPO status , Patient's Chart, lab work & pertinent test results  History of Anesthesia Complications Negative for: history of anesthetic complications  Airway Mallampati: II  TM Distance: >3 FB Neck ROM: Full    Dental  (+) Edentulous Upper, Edentulous Lower   Pulmonary COPD,  COPD inhaler, former smoker   breath sounds clear to auscultation       Cardiovascular hypertension, (-) angina + CAD ('16 cath: non-obstructive/non-obstructive) and + Peripheral Vascular Disease   Rhythm:Regular Rate:Normal  '21 Cath: No significant obstructive coronary disease is noted.  Diffuse three-vessel luminal irregularities. Distal RCA intermediate stenosis from 2020 has resolved luminal irregularities.  PDA contains moderate diffuse disease without focal stenosis. Low normal LVF,  EF 50%  '23 ECHO: Left ventricle size is normal. Wall thickness is normal. Systolic function is moderately abnormal. EF: 40-45%. moderate hypokinesis of the left ventricle. Doppler parameters indicate normal diastolic function. No significant valvular abnormalities    Neuro/Psych  Headaches  Anxiety     Chronic back pain    GI/Hepatic Neg liver ROS,GERD  Controlled and Medicated,,(+)       alcohol use  Endo/Other  diabetes (glu 182), Insulin Dependent  BMI 37  Renal/GU Renal InsufficiencyRenal disease     Musculoskeletal   Abdominal  (+) + obese  Peds  Hematology  (+) Blood dyscrasia (Hb 10.8, plt 213k), anemia   Anesthesia Other Findings   Reproductive/Obstetrics                              Anesthesia Physical Anesthesia Plan  ASA: 3  Anesthesia Plan: General   Post-op Pain Management: Tylenol PO (pre-op)*   Induction: Intravenous  PONV Risk Score and Plan: 2 and Ondansetron and  Dexamethasone  Airway Management Planned: Oral ETT  Additional Equipment: None  Intra-op Plan:   Post-operative Plan: Extubation in OR  Informed Consent: I have reviewed the patients History and Physical, chart, labs and discussed the procedure including the risks, benefits and alternatives for the proposed anesthesia with the patient or authorized representative who has indicated his/her understanding and acceptance.       Plan Discussed with: CRNA and Surgeon  Anesthesia Plan Comments:          Anesthesia Quick Evaluation

## 2023-05-21 NOTE — Anesthesia Procedure Notes (Signed)
Procedure Name: Intubation Date/Time: 05/21/2023 1:13 PM  Performed by: Evlyn Courier, CRNAPre-anesthesia Checklist: Patient identified, Emergency Drugs available, Suction available and Patient being monitored Patient Re-evaluated:Patient Re-evaluated prior to induction Oxygen Delivery Method: Circle System Utilized Preoxygenation: Pre-oxygenation with 100% oxygen Induction Type: IV induction Ventilation: Mask ventilation without difficulty Laryngoscope Size: Mac and 4 Grade View: Grade I Tube type: Oral Tube size: 7.5 mm Number of attempts: 1 Airway Equipment and Method: Stylet and Oral airway Placement Confirmation: ETT inserted through vocal cords under direct vision, positive ETCO2 and breath sounds checked- equal and bilateral Tube secured with: Tape Dental Injury: Teeth and Oropharynx as per pre-operative assessment

## 2023-05-21 NOTE — Op Note (Signed)
Operative Note  Samuel Bailey  [id number]  [id number]  05/21/2023   Surgeon: Phylliss Blakes MD FACS   Procedure performed: Excision and debridement of right upper back abscess and soft tissue infection, wound 6 x 5 x 3 cm   Preop diagnosis: Right upper back abscess and soft tissue infection Post-op diagnosis/intraop findings: Same   Specimens: Cultures Retained items: Packing EBL: Minimal cc Complications: none   Description of procedure: After obtaining informed consent the patient was taken to the operating room and placed supine on operating room table where general endotracheal anesthesia was initiated, preoperative antibiotics were administered, SCDs applied, and a formal timeout was performed.  He was then repositioned in the left lateral decubitus position with all pressure points appropriately padded.  The right upper back was prepped and draped in usual sterile fashion.  Cautery was then used to excise the tenuous skin overlying the phlegmon on the right upper back, creating a wound 6 x 5 x 3 cm in dimension.  Hemostasis was ensured with cautery.  We were able to enter a deeper abscess cavity and additional purulent fluid was drained, in addition to which purulent fluid was seeping from multiple small openings around the edges of the wound.  All this was expressed.  The wound was then packed with saline dampened 4 x 4's followed by dry 4 x 4's and ABD pad and tape.  The patient was then awakened, extubated and taken to PACU in stable condition.    All counts were correct at the completion of the case.

## 2023-05-21 NOTE — Transfer of Care (Signed)
Immediate Anesthesia Transfer of Care Note  Patient: Samuel Bailey  Procedure(s) Performed: IRRIGATION AND DEBRIDEMENT RIGHT UPPER BACK (Right: Back)  Patient Location: PACU  Anesthesia Type:General  Level of Consciousness: awake  Airway & Oxygen Therapy: Patient Spontanous Breathing  Post-op Assessment: Report given to RN  Post vital signs: Reviewed and stable  Last Vitals:  Vitals Value Taken Time  BP 107/47 05/21/23 1400  Temp    Pulse 77 05/21/23 1410  Resp 19 05/21/23 1410  SpO2 93 % 05/21/23 1410  Vitals shown include unvalidated device data.  Last Pain:  Vitals:   05/21/23 1208  TempSrc: Oral  PainSc:          Complications: No notable events documented.

## 2023-05-22 ENCOUNTER — Encounter (HOSPITAL_COMMUNITY): Payer: Self-pay | Admitting: Surgery

## 2023-05-22 ENCOUNTER — Inpatient Hospital Stay (HOSPITAL_COMMUNITY): Payer: Medicaid Other

## 2023-05-22 DIAGNOSIS — R7881 Bacteremia: Secondary | ICD-10-CM

## 2023-05-22 DIAGNOSIS — J869 Pyothorax without fistula: Secondary | ICD-10-CM | POA: Diagnosis not present

## 2023-05-22 DIAGNOSIS — B9561 Methicillin susceptible Staphylococcus aureus infection as the cause of diseases classified elsewhere: Secondary | ICD-10-CM | POA: Diagnosis not present

## 2023-05-22 LAB — HEMOGLOBIN A1C
Hgb A1c MFr Bld: 12.9 % — ABNORMAL HIGH (ref 4.8–5.6)
Mean Plasma Glucose: 324 mg/dL

## 2023-05-22 LAB — ECHOCARDIOGRAM COMPLETE
Area-P 1/2: 3.16 cm2
Calc EF: 48.9 %
Est EF: 50
Height: 67.992 in
MV M vel: 3.65 m/s
MV Peak grad: 53.3 mmHg
MV VTI: 1.69 cm2
S' Lateral: 4.1 cm
Single Plane A2C EF: 42.8 %
Single Plane A4C EF: 52.8 %
Weight: 3887.15 oz

## 2023-05-22 LAB — CBC
HCT: 31.8 % — ABNORMAL LOW (ref 39.0–52.0)
Hemoglobin: 10.3 g/dL — ABNORMAL LOW (ref 13.0–17.0)
MCH: 27.6 pg (ref 26.0–34.0)
MCHC: 32.4 g/dL (ref 30.0–36.0)
MCV: 85.3 fL (ref 80.0–100.0)
Platelets: 241 10*3/uL (ref 150–400)
RBC: 3.73 MIL/uL — ABNORMAL LOW (ref 4.22–5.81)
RDW: 12.9 % (ref 11.5–15.5)
WBC: 9.1 10*3/uL (ref 4.0–10.5)
nRBC: 0 % (ref 0.0–0.2)

## 2023-05-22 LAB — BASIC METABOLIC PANEL
Anion gap: 10 (ref 5–15)
BUN: 19 mg/dL (ref 8–23)
CO2: 22 mmol/L (ref 22–32)
Calcium: 8.1 mg/dL — ABNORMAL LOW (ref 8.9–10.3)
Chloride: 97 mmol/L — ABNORMAL LOW (ref 98–111)
Creatinine, Ser: 1.55 mg/dL — ABNORMAL HIGH (ref 0.61–1.24)
GFR, Estimated: 51 mL/min — ABNORMAL LOW (ref >60)
Glucose, Bld: 376 mg/dL — ABNORMAL HIGH (ref 70–99)
Potassium: 4.6 mmol/L (ref 3.5–5.1)
Sodium: 129 mmol/L — ABNORMAL LOW (ref 135–145)

## 2023-05-22 LAB — GLUCOSE, CAPILLARY
Glucose-Capillary: 389 mg/dL — ABNORMAL HIGH (ref 70–99)
Glucose-Capillary: 425 mg/dL — ABNORMAL HIGH (ref 70–99)
Glucose-Capillary: 280 mg/dL — ABNORMAL HIGH (ref 70–99)
Glucose-Capillary: 148 mg/dL — ABNORMAL HIGH (ref 70–99)

## 2023-05-22 LAB — CULTURE, BLOOD (ROUTINE X 2): Special Requests: ADEQUATE

## 2023-05-22 LAB — AEROBIC/ANAEROBIC CULTURE W GRAM STAIN (SURGICAL/DEEP WOUND)

## 2023-05-22 MED ORDER — POLYETHYLENE GLYCOL 3350 17 G PO PACK
17.0000 g | PACK | Freq: Once | ORAL | Status: AC
  Administered 2023-05-22: 17 g via ORAL
  Filled 2023-05-22: qty 1

## 2023-05-22 MED ORDER — INSULIN ASPART 100 UNIT/ML IJ SOLN
10.0000 [IU] | Freq: Three times a day (TID) | INTRAMUSCULAR | Status: DC
  Administered 2023-05-22 – 2023-05-25 (×10): 10 [IU] via SUBCUTANEOUS

## 2023-05-22 MED ORDER — INSULIN ASPART 100 UNIT/ML IJ SOLN
0.0000 [IU] | Freq: Every day | INTRAMUSCULAR | Status: DC
  Administered 2023-05-23: 2 [IU] via SUBCUTANEOUS

## 2023-05-22 MED ORDER — INSULIN GLARGINE-YFGN 100 UNIT/ML ~~LOC~~ SOLN
60.0000 [IU] | Freq: Two times a day (BID) | SUBCUTANEOUS | Status: DC
  Filled 2023-05-22: qty 0.6

## 2023-05-22 MED ORDER — INSULIN GLARGINE-YFGN 100 UNIT/ML ~~LOC~~ SOLN
70.0000 [IU] | Freq: Two times a day (BID) | SUBCUTANEOUS | Status: DC
  Administered 2023-05-23 – 2023-05-24 (×4): 70 [IU] via SUBCUTANEOUS
  Filled 2023-05-22 (×5): qty 0.7

## 2023-05-22 MED ORDER — INSULIN GLARGINE-YFGN 100 UNIT/ML ~~LOC~~ SOLN
70.0000 [IU] | Freq: Two times a day (BID) | SUBCUTANEOUS | Status: DC
Start: 2023-05-22 — End: 2023-05-22

## 2023-05-22 MED ORDER — INSULIN ASPART 100 UNIT/ML IJ SOLN
0.0000 [IU] | Freq: Three times a day (TID) | INTRAMUSCULAR | Status: DC
  Administered 2023-05-22: 20 [IU] via SUBCUTANEOUS
  Administered 2023-05-22: 11 [IU] via SUBCUTANEOUS
  Administered 2023-05-23 (×2): 4 [IU] via SUBCUTANEOUS
  Administered 2023-05-23: 7 [IU] via SUBCUTANEOUS
  Administered 2023-05-24: 3 [IU] via SUBCUTANEOUS
  Administered 2023-05-24: 4 [IU] via SUBCUTANEOUS
  Administered 2023-05-24: 7 [IU] via SUBCUTANEOUS
  Administered 2023-05-25: 4 [IU] via SUBCUTANEOUS
  Administered 2023-05-25: 7 [IU] via SUBCUTANEOUS

## 2023-05-22 MED ORDER — INSULIN GLARGINE-YFGN 100 UNIT/ML ~~LOC~~ SOLN
35.0000 [IU] | Freq: Once | SUBCUTANEOUS | Status: AC
  Administered 2023-05-22: 35 [IU] via SUBCUTANEOUS
  Filled 2023-05-22: qty 0.35

## 2023-05-22 MED ORDER — GABAPENTIN 300 MG PO CAPS
300.0000 mg | ORAL_CAPSULE | Freq: Three times a day (TID) | ORAL | Status: DC
  Administered 2023-05-22 – 2023-05-25 (×10): 300 mg via ORAL
  Filled 2023-05-22 (×10): qty 1

## 2023-05-22 NOTE — Progress Notes (Signed)
[facility] Progress Note  1 Day Post-Op  Subjective: CC:  Pain controlled, tolerating PO. Expresses he is eager to go home. 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  Objective: Vital signs in last 24 hours: Temp:  [97.7 F (36.5 C)-98.8 F (37.1 C)] 97.7 F (36.5 C) (06/03 0833) Pulse Rate:  [73-87] 80 (06/03 0833) Resp:  [10-20] 18 (06/03 0833) BP: (107-153)/(47-134) 125/90 (06/03 0833) SpO2:  [91 %-97 %] 94 % (06/03 0833) Weight:  [109.7 kg-110.2 kg] 110.2 kg (06/03 0500) Last BM Date : 05/20/23  Intake/Output from previous day: 06/02 0701 - 06/03 0700 In: 610 [P.O.:610] Out: 2900 [Urine:2900] Intake/Output this shift: No intake/output data recorded.  PE: Gen:  Alert, NAD, pleasant Pulm:  Normal effort ORA Abd: Soft, non-tender, non-distended Skin: abscess of right mid/upper back with about 3 cm of surrounding cellulitis, improved  compared to prior markings. Wound bed c/d/I. Some sanguinous oozing from wound edges controlled with pressure.  Psych: A&Ox3   Lab Results:  Recent Labs    05/21/23 0705 05/22/23 0338  WBC 9.1 9.1  HGB 10.8* 10.3*  HCT 33.0* 31.8*  PLT 213 241   BMET Recent Labs    05/21/23 0705 05/22/23 0338  NA 132* 129*  K 3.8 4.6  CL 100 97*  CO2 19* 22  GLUCOSE 202* 376*  BUN 13 19  CREATININE 1.58* 1.55*  CALCIUM 8.0* 8.1*   PT/INR Recent Labs    05/19/23 1905  LABPROT 13.2  INR 1.0   CMP     Component Value Date/Time   NA 129 (L) 05/22/2023 0338   NA 136 03/29/2021 0924   K 4.6 05/22/2023 0338   CL 97 (L) 05/22/2023 0338   CO2 22 05/22/2023 0338   GLUCOSE 376 (H) 05/22/2023 0338   BUN 19 05/22/2023 0338   BUN 14 03/29/2021 0924   CREATININE 1.55 (H) 05/22/2023 0338   CALCIUM 8.1 (L) 05/22/2023 0338   PROT 7.2 05/19/2023 1905   ALBUMIN 2.2 (L) 05/19/2023 1905   AST 7 (L) 05/19/2023 1905   ALT 9 05/19/2023 1905   ALKPHOS  108 05/19/2023 1905   BILITOT 0.6 05/19/2023 1905   GFRNONAA 51 (L) 05/22/2023 0338   GFRAA 125 12/28/2020 1037   Lipase  No results found for: "LIPASE"     Studies/Results: MR Lumbar Spine W Wo Contrast  Result Date: 05/20/2023 CLINICAL DATA:  Low back pain, infection suspected. EXAM: MRI LUMBAR SPINE WITHOUT AND WITH CONTRAST TECHNIQUE: Multiplanar and multiecho pulse sequences of the lumbar spine were obtained without and with intravenous contrast. 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 Lowest rib-bearing segment is labeled as T12 (corresponding to axial series 5, image 8). Alignment:  Physiologic. Vertebrae:  No fracture, evidence of discitis, or bone lesion. Conus medullaris and cauda equina: Conus extends to the L1 level. Conus and cauda equina appear normal. Paraspinal and other soft tissues: No acute abnormality. Disc levels: T12-L1: No significant disc protrusion, foraminal stenosis, or canal stenosis. L1-L2: No disc protrusion. Mild bilateral facet hypertrophy. No foraminal or canal stenosis. L2-L3: Mild annular disc bulge and mild bilateral facet hypertrophy. Annular fissure in the right foraminal zone. Impress upon the ventral thecal sac with borderline-mild canal stenosis. Mild right foraminal stenosis. L3-L4: Annular disc bulge with moderate bilateral facet  arthropathy. No significant canal stenosis. Mild-to-moderate bilateral foraminal stenosis. L4-L5: Annular disc bulge with endplate spurring. Moderate bilateral facet arthropathy. No canal stenosis. Moderate right and mild left foraminal stenosis. L5-S1: Transitional level.  No impingement. IMPRESSION: 1. 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 2. Multilevel lumbar spondylosis, most pronounced at L4-5 where there is moderate right and mild left foraminal stenosis. 3.  Mild-to-moderate bilateral foraminal stenosis at L3-4. 4. Borderline-mild canal stenosis at L2-3. Electronically Signed   By: Duanne Guess D.O.   On: 05/20/2023 17:37    Anti-infectives: Anti-infectives (From admission, onward)    Start     Dose/Rate Route Frequency Ordered Stop   05/21/23 2000  vancomycin (VANCOREADY) IVPB 1750 mg/350 mL  Status:  Discontinued        1,750 mg 175 mL/hr over 120 Minutes Intravenous Every 48 hours 05/19/23 2256 05/20/23 1211   05/20/23 2200  ceFAZolin (ANCEF) IVPB 2g/100 mL premix        2 g 200 mL/hr over 30 Minutes Intravenous Every 8 hours 05/20/23 1212     05/20/23 0800  cefTRIAXone (ROCEPHIN) 2 g in sodium chloride 0.9 % 100 mL IVPB  Status:  Discontinued        2 g 200 mL/hr over 30 Minutes Intravenous Every 24 hours 05/19/23 2302 05/20/23 1211   05/19/23 2000  ceFEPIme (MAXIPIME) 2 g in sodium chloride 0.9 % 100 mL IVPB        2 g 200 mL/hr over 30 Minutes Intravenous  Once 05/19/23 1945 05/19/23 2020   05/19/23 2000  vancomycin (VANCOREADY) IVPB 2000 mg/400 mL        2,000 mg 200 mL/hr over 120 Minutes Intravenous  Once 05/19/23 1952 05/19/23 2220        Assessment/Plan Right back abscess S/p I&D 6/2 Dr. Fredricka Bonine, wound cx pending, GS w. Moderate GPC in clusters  - would continue IV abx today given cellulitis and wait on wound culture data  - anticipate discharge home as early as tomorrow if wound improving and culture data resulted.  - daily dressing changes with moist-to-dry dressings, patient may shower  - ok for lovenox/SQH from surgical standpoint   LOS: 3 days   I reviewed nursing notes, hospitalist notes, last 24 h vitals and pain scores, last 48 h intake and output, last 24 h labs and trends, and last 24 h imaging results.  This care required straight-forward level of medical decision making.   Hosie Spangle, PA-C [facility] Please see Amion for pager number during day hours 7:00am-4:30pm

## 2023-05-22 NOTE — Progress Notes (Signed)
    [facility] has been requested to perform a transesophageal echocardiogram on Samuel Bailey for evaluation of possible endocarditis after blood cultures found positive for MSSA.  The risks and benefits of transesophageal echocardiogram were explained including risks of esophageal damage, perforation (1:10,000 risk), bleeding, pharyngeal hematoma as well as other potential complications associated with conscious sedation including aspiration, arrhythmia, respiratory failure and death. Alternatives to treatment were discussed, questions were answered. After discussion, patient DECLINED transesophageal echocardiogram. Patient's primary team informed. Procedure cancelled.  Perlie Gold PA-C 05/22/2023 3:04 PM

## 2023-05-22 NOTE — TOC Initial Note (Addendum)
Transition of Care [facility]) - Initial/Assessment Note    Patient Details  Name: Samuel Bailey MRN: [id number] Date of Birth: 07/26/1961  [facility] CM/SW Contact:    Epifanio Lesches, RN Phone Number: 05/22/2023, 2:58 PM  Clinical Narrative:                  Presents  with R back abscess  - S/p I&D 6/2   From home with niece. States lady friend to assist with care once d/c. States independent with ADL's PTA, no DME usage. Pt without RX meds concerns or transportation issues.  Per MD ? D/C for tomorrow, wound cultures pending.   TOC team following for needs....  Expected Discharge Plan: Home/Self Care Barriers to Discharge: Continued Medical Work up   Patient Goals and CMS Choice            Expected Discharge Plan and Services   Discharge Planning Services: CM Consult   Living arrangements for the past 2 months: Mobile Home                                      Prior Living Arrangements/Services Living arrangements for the past 2 months: Mobile Home Lives with::  (niece 25y/o ( disable)) Patient language and need for interpreter reviewed:: Yes Do you feel safe going back to the place where you live?: Yes      Need for Family Participation in Patient Care: Yes (Comment) Care giver support system in place?: Yes (comment)   Criminal Activity/Legal Involvement Pertinent to Current Situation/Hospitalization: No - Comment as needed  Activities of Daily Living Home Assistive Devices/Equipment: Wheelchair ADL Screening (condition at time of admission) Patient's cognitive ability adequate to safely complete daily activities?: Yes Is the patient deaf or have difficulty hearing?: No Does the patient have difficulty seeing, even when wearing glasses/contacts?: No Does the patient have difficulty concentrating, remembering, or making decisions?: No Patient able to express need for assistance with ADLs?: Yes Does the patient have difficulty dressing or  bathing?: No Independently performs ADLs?: Yes (appropriate for developmental age) Does the patient have difficulty walking or climbing stairs?: Yes Weakness of Legs: Left Weakness of Arms/Hands: None  Permission Sought/Granted                  Emotional Assessment       Orientation: : Oriented to Self, Oriented to Place, Oriented to  Time, Oriented to Situation Alcohol / Substance Use: Not Applicable Psych Involvement: No (comment)  Admission diagnosis:  Cellulitis and abscess of trunk [L03.319, L02.219] Thoracic abscess (HCC) [J86.9] Patient Active Problem List   Diagnosis Date Noted   MSSA bacteremia 05/20/2023   Cellulitis and abscess of trunk 05/19/2023   Severe sepsis (HCC) 05/19/2023   AKI (acute kidney injury) (HCC) 05/19/2023   Chronic respiratory failure with hypoxia (HCC) 05/19/2023   Hyponatremia 05/19/2023   HTN (hypertension)    Dyspnea on exertion 12/10/2020   Unstable angina pectoris (HCC) 12/10/2020   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) 11/18/2020   Acute on chronic diastolic heart failure (HCC) 11/09/2020   Bilateral leg edema 10/05/2020   Wrist pain    Sciatica of right side    Right hip pain    Irreducible hernia of anterior abdominal wall    Hyperlipemia    High cholesterol    Candidal intertrigo    Anxiety  Blister of toe of right foot 12/02/2019   SIRS (systemic inflammatory response syndrome) (HCC) 12/02/2019   Diabetic neuropathy (HCC) 12/02/2019   Generalized weakness 12/02/2019   History of CVA (cerebrovascular accident) 12/02/2019   History of peptic ulcer 12/02/2019   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   Marijuana smoker 12/02/2019   Obesity (BMI 30-39.9) 12/02/2019   Poorly controlled diabetes mellitus (HCC) 12/02/2019   Unstable angina (HCC) 12/02/2019   Syncope and collapse 12/02/2019   Chest pain 10/31/2019   Cellulitis of right foot 10/31/2019   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 [place] [address] Phone: [phone] Fax: [phone]  [facility] [address] [place] [address] Phone: [phone] Fax: [phone]     Social Determinants of Health (SDOH) Social History: SDOH Screenings   Food Insecurity: No Food Insecurity (05/20/2023)  Housing: Low Risk  (05/20/2023)  Transportation Needs: Unmet Transportation Needs (05/20/2023)  Utilities: Not At Risk (05/20/2023)  Tobacco Use: High Risk (05/22/2023)   SDOH Interventions:     Readmission Risk Interventions     No data to display

## 2023-05-22 NOTE — Progress Notes (Signed)
[facility]    Date of Admission:  05/19/2023   Total days of antibiotics 4/cefazolin          ID: Samuel Bailey is a 62 y.o. male with MSSA bacteremia with thoracic wall - abscess s/p I x D Principal Problem:   MSSA bacteremia Active Problems:   Uncontrolled type 2 diabetes mellitus with hyperglycemia (HCC)   COPD with chronic bronchitis (HCC)   Mild CAD   HTN (hypertension)   Cellulitis and abscess of trunk   Severe sepsis (HCC)   AKI (acute kidney injury) (HCC)   Chronic respiratory failure with hypoxia (HCC)   Hyponatremia    Subjective: Afebrile. Feels improved since I x D  Medications:   aspirin EC  81 mg Oral Daily   atorvastatin  40 mg Oral Daily   Chlorhexidine Gluconate Cloth  6 each Topical Q0600   Chlorhexidine Gluconate Cloth  6 each Topical Q0600   gabapentin  300 mg Oral TID   heparin  5,000 Units Subcutaneous Q8H   insulin aspart  0-20 Units Subcutaneous TID WC   insulin aspart  0-5 Units Subcutaneous QHS   insulin aspart  10 Units Subcutaneous TID WC   insulin glargine-yfgn  60 Units Subcutaneous BID   isosorbide mononitrate  30 mg Oral Daily   metoprolol succinate  12.5 mg Oral Daily   mupirocin ointment  1 Application Nasal BID   sodium chloride flush  3 mL Intravenous Q12H    Objective: Vital signs in last 24 hours: Temp:  [97.7 F (36.5 C)-98.8 F (37.1 C)] 98.1 F (36.7 C) (06/03 1427) Pulse Rate:  [77-85] 77 (06/03 1427) Resp:  [15-19] 16 (06/03 1427) BP: (114-140)/(71-90) 114/76 (06/03 1427) SpO2:  [91 %-97 %] 97 % (06/03 1427) Weight:  [110.2 kg] 110.2 kg (06/03 0500)  Physical Exam  Constitutional: He is oriented to person, place, and time. He appears well-developed and well-nourished. No distress.  HENT:  Mouth/Throat: Oropharynx is clear and moist. No oropharyngeal exudate.  Cardiovascular: Normal rate, regular rhythm and normal heart sounds. Exam reveals no gallop and no friction rub.  No murmur  heard.  Pulmonary/Chest: Effort normal and breath sounds normal. No respiratory distress. He has no wheezes.  Abdominal: Soft. Bowel sounds are normal. He exhibits no distension. There is no tenderness.  Back = I x D incision, with wet to dry packing-serosanginous drainage Neurological: He is alert and oriented to person, place, and time.  Skin: Skin is warm and dry. No rash noted. No erythema.  Psychiatric: He has a normal mood and affect. His behavior is normal.    Lab Results Recent Labs    05/21/23 0705 05/22/23 0338  WBC 9.1 9.1  HGB 10.8* 10.3*  HCT 33.0* 31.8*  NA 132* 129*  K 3.8 4.6  CL 100 97*  CO2 19* 22  BUN 13 19  CREATININE 1.58* 1.55*   Liver Panel Recent Labs    05/19/23 1905  PROT 7.2  ALBUMIN 2.2*  AST 7*  ALT 9  ALKPHOS 108  BILITOT 0.6   Sedimentation Rate No results for input(s): "ESRSEDRATE" in the last 72 hours. C-Reactive Protein No results for input(s): "CRP" in the last 72 hours.  Microbiology: 5/31 blood mssa 6/2 blood cx ngtd 6/2 abscess MSSA Studies/Results: ECHOCARDIOGRAM COMPLETE  Result Date: 05/22/2023    ECHOCARDIOGRAM REPORT   Patient Name:   Samuel Bailey Date of Exam: 05/22/2023 Medical Rec #:  [id number]  Height:       68.0 in Accession #:    [id number]    Weight:       242.9 lb Date of Birth:  10/14/1961     BSA:          2.220 m Patient Age:    62 years      BP:           125/90 mmHg Patient Gender: M             HR:           77 bpm. Exam Location:  Inpatient Procedure: 2D Echo, Color Doppler and Cardiac Doppler Indications:    Bacteremia  History:        Patient has prior history of Echocardiogram examinations, most                 recent 12/11/2020. HFpEF, COPD, PAD and Stroke; Risk                 Factors:Diabetes. Chronic respiratory failure.  Sonographer:    Milda Smart Referring Phys: [id number] CHELSEA A CONNOR  Sonographer Comments: Image acquisition challenging due to respiratory motion. IMPRESSIONS  1. Left ventricular  ejection fraction, by estimation, is 50%. The left ventricle has low normal function. The left ventricle has no regional wall motion abnormalities. There is mild concentric left ventricular hypertrophy. Left ventricular diastolic parameters were normal.  2. Right ventricular systolic function is normal. The right ventricular size is normal.  3. Left atrial size was mildly dilated.  4. The mitral valve is normal in structure. Mild mitral valve regurgitation. No evidence of mitral stenosis.  5. The aortic valve is tricuspid. There is mild calcification of the aortic valve. Aortic valve regurgitation is not visualized. No aortic stenosis is present.  6. Aortic dilatation noted. There is mild dilatation of the ascending aorta, measuring 42 mm.  7. The inferior vena cava is normal in size with greater than 50% respiratory variability, suggesting right atrial pressure of 3 mmHg. Conclusion(s)/Recommendation(s): No evidence of valvular vegetations on this transthoracic echocardiogram. Consider a transesophageal echocardiogram to exclude infective endocarditis if clinically indicated. FINDINGS  Left Ventricle: Left ventricular ejection fraction, by estimation, is 50%. The left ventricle has low normal function. The left ventricle has no regional wall motion abnormalities. The left ventricular internal cavity size was normal in size. There is mild concentric left ventricular hypertrophy. Left ventricular diastolic parameters were normal. Right Ventricle: The right ventricular size is normal. No increase in right ventricular wall thickness. Right ventricular systolic function is normal. Left Atrium: Left atrial size was mildly dilated. Right Atrium: Right atrial size was normal in size. Pericardium: There is no evidence of pericardial effusion. Mitral Valve: The mitral valve is normal in structure. Mild mitral valve regurgitation. No evidence of mitral valve stenosis. MV peak gradient, 6.7 mmHg. The mean mitral valve gradient  is 3.0 mmHg. Tricuspid Valve: The tricuspid valve is normal in structure. Tricuspid valve regurgitation is trivial. No evidence of tricuspid stenosis. Aortic Valve: The aortic valve is tricuspid. There is mild calcification of the aortic valve. Aortic valve regurgitation is not visualized. No aortic stenosis is present. Pulmonic Valve: The pulmonic valve was normal in structure. Pulmonic valve regurgitation is trivial. No evidence of pulmonic stenosis. Aorta: Aortic dilatation noted. There is mild dilatation of the ascending aorta, measuring 42 mm. Venous: The inferior vena cava is normal in size with greater than 50% respiratory variability, suggesting right atrial pressure of 3 mmHg.  IAS/Shunts: No atrial level shunt detected by color flow Doppler.  LEFT VENTRICLE PLAX 2D LVIDd:         5.20 cm      Diastology LVIDs:         4.10 cm      LV e' medial:    5.87 cm/s LV PW:         1.20 cm      LV E/e' medial:  17.5 LV IVS:        1.20 cm      LV e' lateral:   7.83 cm/s LVOT diam:     2.00 cm      LV E/e' lateral: 13.2 LV SV:         72 LV SV Index:   33 LVOT Area:     3.14 cm  LV Volumes (MOD) LV vol d, MOD A2C: 148.0 ml LV vol d, MOD A4C: 184.0 ml LV vol s, MOD A2C: 84.7 ml LV vol s, MOD A4C: 86.8 ml LV SV MOD A2C:     63.3 ml LV SV MOD A4C:     184.0 ml LV SV MOD BP:      82.2 ml RIGHT VENTRICLE             IVC RV S prime:     14.30 cm/s  IVC diam: 2.30 cm TAPSE (M-mode): 2.4 cm LEFT ATRIUM             Index        RIGHT ATRIUM          Index LA diam:        4.20 cm 1.89 cm/m   RA Area:     9.72 cm LA Vol (A2C):   58.2 ml 26.22 ml/m  RA Volume:   13.90 ml 6.26 ml/m LA Vol (A4C):   48.4 ml 21.80 ml/m LA Biplane Vol: 53.3 ml 24.01 ml/m  AORTIC VALVE LVOT Vmax:   107.00 cm/s LVOT Vmean:  78.300 cm/s LVOT VTI:    0.230 m  AORTA Ao Root diam: 3.40 cm Ao Asc diam:  4.20 cm MITRAL VALVE                TRICUSPID VALVE MV Area (PHT): 3.16 cm     TR Peak grad:   15.1 mmHg MV Area VTI:   1.69 cm     TR Vmax:         194.00 cm/s MV Peak grad:  6.7 mmHg MV Mean grad:  3.0 mmHg     SHUNTS MV Vmax:       1.29 m/s     Systemic VTI:  0.23 m MV Vmean:      88.8 cm/s    Systemic Diam: 2.00 cm MV Decel Time: 240 msec MR Peak grad: 53.3 mmHg MR Vmax:      365.00 cm/s MV E velocity: 103.00 cm/s MV A velocity: 86.80 cm/s MV E/A ratio:  1.19 Arvilla Meres MD Electronically signed by Arvilla Meres MD Signature Date/Time: 05/22/2023/11:17:41 AM    Final    MR Lumbar Spine W Wo Contrast  Result Date: 05/20/2023 CLINICAL DATA:  Low back pain, infection suspected. EXAM: MRI LUMBAR SPINE WITHOUT AND WITH CONTRAST TECHNIQUE: Multiplanar and multiecho pulse sequences of the lumbar spine were obtained without and with intravenous contrast. 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 Lowest rib-bearing segment is  labeled as T12 (corresponding to axial series 5, image 8). Alignment:  Physiologic. Vertebrae:  No fracture, evidence of discitis, or bone lesion. Conus medullaris and cauda equina: Conus extends to the L1 level. Conus and cauda equina appear normal. Paraspinal and other soft tissues: No acute abnormality. Disc levels: T12-L1: No significant disc protrusion, foraminal stenosis, or canal stenosis. L1-L2: No disc protrusion. Mild bilateral facet hypertrophy. No foraminal or canal stenosis. L2-L3: Mild annular disc bulge and mild bilateral facet hypertrophy. Annular fissure in the right foraminal zone. Impress upon the ventral thecal sac with borderline-mild canal stenosis. Mild right foraminal stenosis. L3-L4: Annular disc bulge with moderate bilateral facet arthropathy. No significant canal stenosis. Mild-to-moderate bilateral foraminal stenosis. L4-L5: Annular disc bulge with endplate spurring. Moderate bilateral facet arthropathy. No canal stenosis. Moderate right and mild left foraminal stenosis.  L5-S1: Transitional level.  No impingement. IMPRESSION: 1. 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 2. Multilevel lumbar spondylosis, most pronounced at L4-5 where there is moderate right and mild left foraminal stenosis. 3. Mild-to-moderate bilateral foraminal stenosis at L3-4. 4. Borderline-mild canal stenosis at L2-3. Electronically Signed   By: Duanne Guess D.O.   On: 05/20/2023 17:37     Assessment/Plan: Thoracic wall abscess complicated with MSSA bacteremia = continue with cefazolin, currently on day 4.   Will pursue TEE to rule out endocarditis  - continue packing wound per shift with wet to dry dressing  [facility] Pager: [phone]  05/22/2023, 2:51 PM

## 2023-05-22 NOTE — Progress Notes (Signed)
  Echocardiogram 2D Echocardiogram has been performed.  Milda Smart 05/22/2023, 10:50 AM

## 2023-05-22 NOTE — Progress Notes (Signed)
Progress Note   Patient: Samuel Bailey ZOX:[id number] DOB: 05-Dec-1961 DOA: 05/19/2023     3 DOS: the patient was seen and examined on 05/22/2023   Brief hospital course: 62 y.o. 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   Patient reports that he developed pruritus involving his upper back on the right side roughly 2 weeks ago.  He noted a small tender nodule and surrounding erythema at that site which continued to enlarge.  He has had spontaneous purulent drainage  Assessment and Plan: 1. Severe sepsis d/t cellulitis and abscess with MSSA bacteremia present on admit - Abscess is s/p bedside I&D in ED; there is significant surrounding cellulitis  - Continue broad-spectrum antibiotics -Pt noted to be bacteremic with MSSA bacteremia -General Surgery was consulted regarding debridement of back abscess. Pt was taken to OR 6/2 where wound was debrided -ID is following. Plans to continue cefazolin -Given duration of illness, TEE was recommended by ID to r/u endocarditis -Cardiology was consulted, however pt later refused procedure   2. AKI  - SCr is 1.62 on admission, up from 1.02 in July 2023   - Suspect this is acute in setting of sepsis and loss of appetite  -cont IVF with Cr down to 1.55 -Recheck bmet in AM   3. Insulin-dependent DM  - A1c was 12.5% in January 2023  - glucose poorly controlled this AM, into the 400's - Pt is on 70 units BID long-acting at home, will resume   4. COPD; chronic hypoxic respiratory failure  - Not in exacerbation on admission  - Continue supplemental O2, as-needed DuoNebs     5. Hypertension  - Hold losartan as above, continue metoprolol as tolerated   -BP stable and  controlled   6. CAD  - No chest pain - Continue ASA, Lipitor, and metoprolol    7. Chronic HFpEF - Appears hypovolemic on admission  - Held Lasix and losartan in light of AKI   8. Hyponatremia  - Continue IVF hydration -recheck bmet in AM   9. Hx of CVA  - Continue ASA and Lipitor    Subjective: Feeling better. Asking about going home  Physical Exam: Vitals:   05/21/23 1940 05/22/23 0500 05/22/23 0833 05/22/23 1427  BP: (!) 140/77  (!) 125/90 114/76  Pulse:   80 77  Resp: 19  18 16   Temp: 98.8 F (37.1 C)  97.7 F (36.5 C) 98.1 F (36.7 C)  TempSrc: Oral  Oral Oral  SpO2: 95%  94% 97%  Weight:  110.2 kg    Height:       General exam: Conversant, in no acute distress Respiratory system: normal chest rise, clear, no audible wheezing Cardiovascular system: regular rhythm, s1-s2 Gastrointestinal system: Nondistended, nontender, pos BS Central nervous system: No seizures, no tremors Extremities: No cyanosis, no joint deformities Skin: No rashes, no pallor, post-op dressings over back wound Psychiatry: Affect normal // no auditory hallucinations   Data Reviewed:  Labs reviewed: Na 129, K 4.6, Cr 1.55, hgb 10.3  Family Communication: Pt in room, family not at bedside  Disposition: Status is: Inpatient Remains inpatient appropriate because: Severity of illness  Planned Discharge Destination: Home    Author: Rickey Barbara, MD 05/22/2023 4:09 PM  For on call  review [email or web address].

## 2023-05-23 ENCOUNTER — Other Ambulatory Visit (HOSPITAL_COMMUNITY): Payer: Self-pay

## 2023-05-23 DIAGNOSIS — J869 Pyothorax without fistula: Secondary | ICD-10-CM | POA: Diagnosis not present

## 2023-05-23 DIAGNOSIS — B9561 Methicillin susceptible Staphylococcus aureus infection as the cause of diseases classified elsewhere: Secondary | ICD-10-CM | POA: Diagnosis not present

## 2023-05-23 DIAGNOSIS — R7881 Bacteremia: Secondary | ICD-10-CM | POA: Diagnosis not present

## 2023-05-23 LAB — BASIC METABOLIC PANEL
Anion gap: 11 (ref 5–15)
BUN: 21 mg/dL (ref 8–23)
CO2: 24 mmol/L (ref 22–32)
Calcium: 8.3 mg/dL — ABNORMAL LOW (ref 8.9–10.3)
Chloride: 98 mmol/L (ref 98–111)
Creatinine, Ser: 1.52 mg/dL — ABNORMAL HIGH (ref 0.61–1.24)
GFR, Estimated: 52 mL/min — ABNORMAL LOW (ref >60)
Glucose, Bld: 244 mg/dL — ABNORMAL HIGH (ref 70–99)
Potassium: 4.2 mmol/L (ref 3.5–5.1)
Sodium: 133 mmol/L — ABNORMAL LOW (ref 135–145)

## 2023-05-23 LAB — GLUCOSE, CAPILLARY
Glucose-Capillary: 187 mg/dL — ABNORMAL HIGH (ref 70–99)
Glucose-Capillary: 207 mg/dL — ABNORMAL HIGH (ref 70–99)
Glucose-Capillary: 166 mg/dL — ABNORMAL HIGH (ref 70–99)
Glucose-Capillary: 233 mg/dL — ABNORMAL HIGH (ref 70–99)

## 2023-05-23 LAB — CBC
HCT: 33.1 % — ABNORMAL LOW (ref 39.0–52.0)
Hemoglobin: 10.7 g/dL — ABNORMAL LOW (ref 13.0–17.0)
MCH: 28.3 pg (ref 26.0–34.0)
MCHC: 32.3 g/dL (ref 30.0–36.0)
MCV: 87.6 fL (ref 80.0–100.0)
Platelets: 255 10*3/uL (ref 150–400)
RBC: 3.78 MIL/uL — ABNORMAL LOW (ref 4.22–5.81)
RDW: 13 % (ref 11.5–15.5)
WBC: 9 10*3/uL (ref 4.0–10.5)
nRBC: 0.2 % (ref 0.0–0.2)

## 2023-05-23 LAB — CULTURE, BLOOD (ROUTINE X 2): Special Requests: ADEQUATE

## 2023-05-23 MED ORDER — SENNOSIDES-DOCUSATE SODIUM 8.6-50 MG PO TABS
1.0000 | ORAL_TABLET | Freq: Every day | ORAL | Status: DC
  Administered 2023-05-23 – 2023-05-25 (×3): 1 via ORAL
  Filled 2023-05-23 (×3): qty 1

## 2023-05-23 MED ORDER — POLYETHYLENE GLYCOL 3350 17 G PO PACK
17.0000 g | PACK | Freq: Every day | ORAL | Status: DC | PRN
  Administered 2023-05-23: 17 g via ORAL
  Filled 2023-05-23: qty 1

## 2023-05-23 NOTE — Inpatient Diabetes Management (Signed)
Inpatient Diabetes Program Recommendations  AACE/ADA: New Consensus Statement on Inpatient Glycemic Control   Target Ranges:  Prepandial:   less than 140 mg/dL      Peak postprandial:   less than 180 mg/dL (1-2 hours)      Critically ill patients:  140 - 180 mg/dL    Latest Reference Range & Units 05/22/23 08:40 05/22/23 11:00 05/22/23 15:31 05/22/23 20:39 05/23/23 07:20  Glucose-Capillary 70 - 99 mg/dL 981 (H) 191 (H) 478 (H) 148 (H) 187 (H)    Latest Reference Range & Units 10/31/19 08:10 12/10/20 23:06 05/20/23 06:03  Hemoglobin A1C 4.8 - 5.6 % 12.7 (H) 13.4 (H) 12.9 (H)   Review of Glycemic Control  Diabetes history: DM2 Outpatient Diabetes medications: Lantus 60 units BID, Novolog 6-10 units 3-4 times a day Current orders for Inpatient glycemic control: Semglee 70 units BID, Novolog 0-20 units TID with meals, Novolog 0-5 units QHS, Novolog 10 units TID with meals  Inpatient Diabetes Program Recommendations:    HbgA1C: A1C 12.9% on 05/20/23 indicating average glucose of 324 mg/dl over the past 2-3 months.  NOTE: Spoke with patient over the phone (coordinator working remotely) about diabetes and home regimen for diabetes control. Patient reports being followed by PCP for diabetes management and currently taking Lantus 60 units BID and Novolog 6-10 units 3-4 times a day as an outpatient for diabetes control. Patient reports taking DM medications as prescribed. Patient reports checking glucose 3-4 times per day and that it is usually in the 300's mg/dl in the morning and 295-621'H mg/dl during the day.  Patient states that he has been trying to get a continuous glucose monitoring sensor (CGM) but he has not been able to get on it yet due to insurance. Pateint does not have smart phone and would need a reader device as well.  Discussed A1C results (12.9% on 05/20/23) and explained that current A1C indicates an average glucose of 324 mg/dl over the past 2-3 months. Discussed glucose and A1C goals.  Discussed importance of checking CBGs and maintaining good CBG control to prevent long-term and short-term complications. Stressed to the patient the importance of improving glycemic control to prevent further complications from uncontrolled diabetes especially since he has abscess.  Discussed impact of nutrition, exercise, stress, sickness, and medications on diabetes control. Informed patient that his outpatient DM medications may be adjusted at discharge and asked that he pay attention to see if insulin dosages are changed. Encouraged patient to follow up with PCP within a week of discharge regarding DM control. Patient states that he plans to call his PCP to get an appointment. Informed patient I would ask outpatient [facility] pharmacy to check on CGM.   Patient verbalized understanding of information discussed and reports no further questions at this time related to diabetes. Sent chat message to Roland Earl, CPhT to check and see if insurance covers CGM; informed that insurance requires prior authorization for FreeStyle Libre3 and for Dexcom G7 CGM. Dr. Rhona Leavens would prefer that patient work with his PCP to obtain prior authorization. Called patient back to let him know that CGM requires prior authorization and encouraged patient to call PCP about working with insurance to get prior authorization completed.   Thanks, Orlando Penner, RN, MSN, CDE Diabetes Coordinator Inpatient Diabetes Program [phone] (Team Pager)

## 2023-05-23 NOTE — Progress Notes (Signed)
PHARMACY CONSULT NOTE FOR:  OUTPATIENT  PARENTERAL ANTIBIOTIC THERAPY (OPAT)  Indication: MSSA bacteremia Regimen: Cefazolin 2g IV every 8 hours End date: 07/02/23 (6 weeks from OR and neg BCx on 05/21/23)  IV antibiotic discharge orders are pended. To discharging provider:  please sign these orders via discharge navigator,  Select New Orders & click on the button choice - Manage This Unsigned Work.     Thank you for allowing pharmacy to be a part of this patient's care.  Georgina Pillion, PharmD, BCPS Infectious Diseases Clinical Pharmacist 05/24/2023 11:18 AM   **Pharmacist phone directory can now be found on [email or web address] ([email or web address]).  Listed under [facility].

## 2023-05-23 NOTE — TOC Progression Note (Signed)
Transition of Care [facility]) - Progression Note    Patient Details  Name: Samuel Bailey MRN: [id number] Date of Birth: 1961/05/04  Transition of Care [facility] CM/SW Contact  Lawerance Sabal, RN Phone Number: 05/23/2023, 9:57 AM  Clinical Narrative:     Sherron Monday w patient at bedside, explained IV Abx process for home. He states that his friend who lives on his property has been helping him at home for the last 6 months will be able to assist with IV Abx as well.  I encouraged him to reach out to her so we can coordinate teaching once he gets his PICC.  Reached out to [employer] to follow for IV med set up and requested assistance with [facility] RN through [place] or [place] if possible. Mr Adler states that his friend at home will be able to continue to do his dressing changes.  He states he has all needed DME at home.    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 per patient, he has all needed DME) Living arrangements for the past 2 months: Mobile Home                 DME Arranged: N/A         HH Arranged: IV Antibiotics, RN HH Agency: [facility] Date HH Agency Contacted: 05/23/23 Time HH Agency Contacted: [phone] Representative spoke with at [facility] Agency: Pam   Social Determinants of Health (SDOH) Interventions SDOH Screenings   Food Insecurity: No Food Insecurity (05/20/2023)  Housing: Low Risk  (05/20/2023)  Transportation Needs: Unmet Transportation Needs (05/20/2023)  Utilities: Not At Risk (05/20/2023)  Tobacco Use: High Risk (05/22/2023)    Readmission Risk Interventions     No data to display

## 2023-05-23 NOTE — Progress Notes (Signed)
  Progress Note   Patient: Samuel Bailey ZOX:[id number] DOB: 1961/02/22 DOA: 05/19/2023     4 DOS: the patient was seen and examined on 05/23/2023   Brief hospital course: 62 y.o. 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   Patient reports that he developed pruritus involving his upper back on the right side roughly 2 weeks ago.  He noted a small tender nodule and surrounding erythema at that site which continued to enlarge.  He has had spontaneous purulent drainage  Assessment and Plan: 1. Severe sepsis d/t cellulitis and abscess with MSSA bacteremia present on admit -Pt noted to be bacteremic with MSSA bacteremia -General Surgery was consulted and pt is now s/p debridement on 6/2 -ID is following. Given duration of infection, there was concern for endocarditis with TEE recommended. Pt refused -Repeat blood cx pending . If neg, then plan PICC and 6 wks cefazolin   2. AKI  - SCr is 1.62 on admission, up from 1.02 in July 2023   - Suspect this is acute in setting of sepsis and loss of appetite  -Cr down to 1.52 -Recheck bmet in AM   3. Insulin-dependent DM  - A1c was 12.5% in January 2023  - Pt is on 70 units BID long-acting at home, will continue   4. COPD; chronic hypoxic respiratory failure  - Not in exacerbation on admission  - Continue supplemental O2, as-needed DuoNebs     5. Hypertension  - Hold losartan as above, continue metoprolol as tolerated   -BP stable and controlled   6. CAD  - No chest pain - Continue ASA, Lipitor, and metoprolol    7. Chronic HFpEF - Appears hypovolemic on admission  - Held Lasix and losartan in light of AKI   8. Hyponatremia  - Improved with IVF hydration   9. Hx  of CVA  - Continue ASA and Lipitor    Subjective: States feeling better and eager to go home soon  Physical Exam: Vitals:   05/22/23 1943 05/23/23 0420 05/23/23 0724 05/23/23 1600  BP: 126/66 (!) 149/74 139/79 113/80  Pulse: 87 70 69 77  Resp: 20 17 17 16   Temp:   98.3 F (36.8 C) 98.4 F (36.9 C)  TempSrc:   Oral Oral  SpO2: 98% 97% 97% 99%  Weight:      Height:       General exam: Awake, laying in bed, in nad Respiratory system: Normal respiratory effort, no wheezing Cardiovascular system: regular rate, s1, s2 Gastrointestinal system: Soft, nondistended, positive BS Central nervous system: CN2-12 grossly intact, strength intact Extremities: Perfused, no clubbing Skin: Normal skin turgor, no notable skin lesions seen Psychiatry: Mood normal // no visual hallucinations   Data Reviewed:  Labs reviewed: Na 133, K 4.2, Cr 1.52, WBC 9.0, Hgb 10.7, Plts 255  Family Communication: Pt in room, family not at bedside  Disposition: Status is: Inpatient Remains inpatient appropriate because: Severity of illness  Planned Discharge Destination: Home    Author: Rickey Barbara, MD 05/23/2023 6:18 PM  For on call review [email or web address].

## 2023-05-23 NOTE — Progress Notes (Signed)
[facility]  Date of Admission:  05/19/2023     Total days of antibiotics 5         ASSESSMENT:  Mr. Palazzolo is POD #2 from debridement of right upper back abscess with course complicated by development of MSSA bacteremia.  Blood culture from 05/21/23 have been without growth to date. Continue to hold PICC line placement pending clearance of blood cultures. Has refused TEE and discussed the benefits and risks associated with TEE completion. Reviewed plan of care for antibiotic therapy for 6 weeks from date of surgery. Continue current dose of Cefazolin and monitor blood cultures for clearance of bacteremia. Wound care per General Surgery recommendations. Remaining medical and supportive care per Internal Medicine.   PLAN:  Continue current dose of Cefazolin. Hold PICC line placement pending blood culture clearance.  Monitor cultures for clearance of bacteremia. Wound care per General Surgery. Remaining medical and supportive care per Internal Medicine.   I have personally spent  27 minutes involved in face-to-face and non-face-to-face activities for this patient on the day of the visit. Professional time spent includes the following activities: Preparing to see the patient (review of tests), Obtaining and/or reviewing separately obtained history (admission/discharge record), Performing a medically appropriate examination and/or evaluation , Ordering medications/tests/procedures, referring and communicating with other health care professionals, Documenting clinical information in the EMR, Independently interpreting results (not separately reported), Communicating results to the patient/family/caregiver, Counseling and educating the patient/family/caregiver and Care coordination (not separately reported).   Principal Problem:   MSSA bacteremia Active Problems:   Uncontrolled type 2 diabetes mellitus with hyperglycemia (HCC)   COPD with chronic bronchitis (HCC)   Mild  CAD   HTN (hypertension)   Cellulitis and abscess of trunk   Severe sepsis (HCC)   AKI (acute kidney injury) (HCC)   Chronic respiratory failure with hypoxia (HCC)   Hyponatremia    aspirin EC  81 mg Oral Daily   atorvastatin  40 mg Oral Daily   Chlorhexidine Gluconate Cloth  6 each Topical Q0600   Chlorhexidine Gluconate Cloth  6 each Topical Q0600   gabapentin  300 mg Oral TID   heparin  5,000 Units Subcutaneous Q8H   insulin aspart  0-20 Units Subcutaneous TID WC   insulin aspart  0-5 Units Subcutaneous QHS   insulin aspart  10 Units Subcutaneous TID WC   insulin glargine-yfgn  70 Units Subcutaneous BID   isosorbide mononitrate  30 mg Oral Daily   metoprolol succinate  12.5 mg Oral Daily   mupirocin ointment  1 Application Nasal BID   sodium chloride flush  3 mL Intravenous Q12H    SUBJECTIVE:  Afebrile overnight with no acute events. Refused TEE. Overall feeling better and tolerating antibiotics. Dressing recently changed.    Allergies  Allergen Reactions   Lovastatin     diarrhea     Review of Systems: Review of Systems  Constitutional:  Negative for chills, fever and weight loss.  Respiratory:  Negative for cough, shortness of breath and wheezing.   Cardiovascular:  Negative for chest pain and leg swelling.  Gastrointestinal:  Negative for abdominal pain, constipation, diarrhea, nausea and vomiting.  Skin:  Negative for rash.      OBJECTIVE: Vitals:   05/22/23 1427 05/22/23 1943 05/23/23 0420 05/23/23 0724  BP: 114/76 126/66 (!) 149/74 139/79  Pulse: 77 87 70 69  Resp: 16 20 17 17   Temp: 98.1 F (36.7 C)   98.3 F (36.8 C)  TempSrc:  Oral   Oral  SpO2: 97% 98% 97% 97%  Weight:      Height:       Body mass index is 36.95 kg/m.  Physical Exam Constitutional:      General: He is not in acute distress.    Appearance: He is well-developed.  Cardiovascular:     Rate and Rhythm: Normal rate and regular rhythm.     Heart sounds: Normal heart sounds.   Pulmonary:     Effort: Pulmonary effort is normal.     Breath sounds: Normal breath sounds.  Skin:    General: Skin is warm and dry.     Comments: Dressing in place.   Neurological:     Mental Status: He is alert and oriented to person, place, and time.     Lab Results Lab Results  Component Value Date   WBC 9.0 05/23/2023   HGB 10.7 (L) 05/23/2023   HCT 33.1 (L) 05/23/2023   MCV 87.6 05/23/2023   PLT 255 05/23/2023    Lab Results  Component Value Date   CREATININE 1.52 (H) 05/23/2023   BUN 21 05/23/2023   NA 133 (L) 05/23/2023   K 4.2 05/23/2023   CL 98 05/23/2023   CO2 24 05/23/2023    Lab Results  Component Value Date   ALT 9 05/19/2023   AST 7 (L) 05/19/2023   ALKPHOS 108 05/19/2023   BILITOT 0.6 05/19/2023     Microbiology: Recent Results (from the past 240 hour(s))  Blood Culture (routine x 2)     Status: None (Preliminary result)   Collection Time: 05/19/23  7:05 PM   Specimen: BLOOD  Result Value Ref Range Status   Specimen Description BLOOD SITE NOT SPECIFIED  Final   Special Requests   Final    BOTTLES DRAWN AEROBIC AND ANAEROBIC Blood Culture adequate volume   Culture   Final    NO GROWTH 3 DAYS Performed at [facility], [address] [place], [place] [address]    Report Status PENDING  Incomplete  Blood Culture (routine x 2)     Status: Abnormal   Collection Time: 05/19/23  7:16 PM   Specimen: BLOOD  Result Value Ref Range Status   Specimen Description BLOOD SITE NOT SPECIFIED  Final   Special Requests   Final    BOTTLES DRAWN AEROBIC AND ANAEROBIC Blood Culture adequate volume   Culture  Setup Time   Final    GRAM POSITIVE COCCI IN BOTH AEROBIC AND ANAEROBIC BOTTLES CRITICAL RESULT CALLED TO, READ BACK BY AND VERIFIED WITH: PHARMD LAUREN BELL 05/20/23 AT 1057 BY Berline Chough, MT Performed at [facility], [address], [place], [place] [address]    Culture STAPHYLOCOCCUS AUREUS (A)  Final   Report Status 05/22/2023 FINAL   Final   Organism ID, Bacteria STAPHYLOCOCCUS AUREUS  Final      Susceptibility   Staphylococcus aureus - MIC*    CIPROFLOXACIN >=8 RESISTANT Resistant     ERYTHROMYCIN <=0.25 SENSITIVE Sensitive     GENTAMICIN <=0.5 SENSITIVE Sensitive     OXACILLIN <=0.25 SENSITIVE Sensitive     TETRACYCLINE <=1 SENSITIVE Sensitive     VANCOMYCIN <=0.5 SENSITIVE Sensitive     TRIMETH/SULFA >=320 RESISTANT Resistant     CLINDAMYCIN <=0.25 SENSITIVE Sensitive     RIFAMPIN <=0.5 SENSITIVE Sensitive     Inducible Clindamycin NEGATIVE Sensitive     LINEZOLID 2 SENSITIVE Sensitive     * STAPHYLOCOCCUS AUREUS  Blood  Culture ID Panel (Reflexed)     Status: Abnormal   Collection Time: 05/19/23  7:16 PM  Result Value Ref Range Status   Enterococcus faecalis NOT DETECTED NOT DETECTED Final   Enterococcus Faecium NOT DETECTED NOT DETECTED Final   Listeria monocytogenes NOT DETECTED NOT DETECTED Final   Staphylococcus species DETECTED (A) NOT DETECTED Final    Comment: CRITICAL RESULT CALLED TO, READ BACK BY AND VERIFIED WITH: PHARMD LAUREN BELL 05/20/23 1057 BY J RAZZAK, MT    Staphylococcus aureus (BCID) DETECTED (A) NOT DETECTED Final    Comment: CRITICAL RESULT CALLED TO, READ BACK BY AND VERIFIED WITH: PHARMD LAUREN BELL 05/20/23 1057 BY J RAZZAK, MT    Staphylococcus epidermidis NOT DETECTED NOT DETECTED Final   Staphylococcus lugdunensis NOT DETECTED NOT DETECTED Final   Streptococcus species NOT DETECTED NOT DETECTED Final   Streptococcus agalactiae NOT DETECTED NOT DETECTED Final   Streptococcus pneumoniae NOT DETECTED NOT DETECTED Final   Streptococcus pyogenes NOT DETECTED NOT DETECTED Final   A.calcoaceticus-baumannii NOT DETECTED NOT DETECTED Final   Bacteroides fragilis NOT DETECTED NOT DETECTED Final   Enterobacterales NOT DETECTED NOT DETECTED Final   Enterobacter cloacae complex NOT DETECTED NOT DETECTED Final   Escherichia coli NOT DETECTED NOT DETECTED Final   Klebsiella aerogenes NOT  DETECTED NOT DETECTED Final   Klebsiella oxytoca NOT DETECTED NOT DETECTED Final   Klebsiella pneumoniae NOT DETECTED NOT DETECTED Final   Proteus species NOT DETECTED NOT DETECTED Final   Salmonella species NOT DETECTED NOT DETECTED Final   Serratia marcescens NOT DETECTED NOT DETECTED Final   Haemophilus influenzae NOT DETECTED NOT DETECTED Final   Neisseria meningitidis NOT DETECTED NOT DETECTED Final   Pseudomonas aeruginosa NOT DETECTED NOT DETECTED Final   Stenotrophomonas maltophilia NOT DETECTED NOT DETECTED Final   Candida albicans NOT DETECTED NOT DETECTED Final   Candida auris NOT DETECTED NOT DETECTED Final   Candida glabrata NOT DETECTED NOT DETECTED Final   Candida krusei NOT DETECTED NOT DETECTED Final   Candida parapsilosis NOT DETECTED NOT DETECTED Final   Candida tropicalis NOT DETECTED NOT DETECTED Final   Cryptococcus neoformans/gattii NOT DETECTED NOT DETECTED Final   Meth resistant mecA/C and MREJ NOT DETECTED NOT DETECTED Final    Comment: Performed at [facility], [address] [place], [place] [address]  Aerobic Culture w Gram Stain (superficial specimen)     Status: None   Collection Time: 05/19/23  9:04 PM   Specimen: Thoracic  Result Value Ref Range Status   Specimen Description THORACIC  Final   Special Requests NONE  Final   Gram Stain   Final    NO WBC SEEN RARE GRAM POSITIVE COCCI Performed at [facility], [address] [place], [place] [address]    Culture MODERATE STAPHYLOCOCCUS AUREUS  Final   Report Status 05/21/2023 FINAL  Final   Organism ID, Bacteria STAPHYLOCOCCUS AUREUS  Final      Susceptibility   Staphylococcus aureus - MIC*    CIPROFLOXACIN >=8 RESISTANT Resistant     ERYTHROMYCIN <=0.25 SENSITIVE Sensitive     GENTAMICIN <=0.5 SENSITIVE Sensitive     OXACILLIN 0.5 SENSITIVE Sensitive     TETRACYCLINE <=1 SENSITIVE Sensitive     VANCOMYCIN <=0.5 SENSITIVE Sensitive     TRIMETH/SULFA >=320 RESISTANT Resistant      CLINDAMYCIN <=0.25 SENSITIVE Sensitive     RIFAMPIN <=0.5 SENSITIVE Sensitive     Inducible Clindamycin NEGATIVE Sensitive     LINEZOLID 2 SENSITIVE  Sensitive     * MODERATE STAPHYLOCOCCUS AUREUS  Surgical pcr screen     Status: Abnormal   Collection Time: 05/21/23  5:47 AM   Specimen: Nasal Mucosa; Nasal Swab  Result Value Ref Range Status   MRSA, PCR NEGATIVE NEGATIVE Final   Staphylococcus aureus POSITIVE (A) NEGATIVE Final    Comment: (NOTE) The Xpert SA Assay (FDA approved for NASAL specimens in patients 69 years of age and older), is one component of a comprehensive surveillance program. It is not intended to diagnose infection nor to guide or monitor treatment. Performed at [facility], [address], [place], [place] [address]   Culture, blood (Routine X 2) w Reflex to ID Panel     Status: None (Preliminary result)   Collection Time: 05/21/23  7:05 AM   Specimen: BLOOD  Result Value Ref Range Status   Specimen Description BLOOD BLOOD RIGHT HAND  Final   Special Requests   Final    BOTTLES DRAWN AEROBIC AND ANAEROBIC Blood Culture results may not be optimal due to an inadequate volume of blood received in culture bottles   Culture   Final    NO GROWTH 1 DAY Performed at [facility], [address], [place], [place] [address]    Report Status PENDING  Incomplete  Culture, blood (Routine X 2) w Reflex to ID Panel     Status: None (Preliminary result)   Collection Time: 05/21/23  7:05 AM   Specimen: BLOOD  Result Value Ref Range Status   Specimen Description BLOOD BLOOD LEFT HAND  Final   Special Requests   Final    BOTTLES DRAWN AEROBIC AND ANAEROBIC Blood Culture adequate volume   Culture   Final    NO GROWTH 1 DAY Performed at [facility], [address], [place], [place] [address]    Report Status PENDING  Incomplete  Aerobic/Anaerobic Culture w Gram Stain (surgical/deep wound)     Status: None (Preliminary result)   Collection Time: 05/21/23   1:43 PM   Specimen: Abscess  Result Value Ref Range Status   Specimen Description ABSCESS  Final   Special Requests BACK  Final   Gram Stain   Final    ABUNDANT WBC PRESENT, PREDOMINANTLY PMN MODERATE GRAM POSITIVE COCCI IN CLUSTERS    Culture   Final    MODERATE STAPHYLOCOCCUS AUREUS SUSCEPTIBILITIES TO FOLLOW Performed at [facility], [address], [place], [place] [address]    Report Status PENDING  Incomplete     Marcos Eke, NP [facility]  05/23/2023  10:18 AM

## 2023-05-23 NOTE — Progress Notes (Signed)
[facility] Progress Note  2 Days Post-Op  Subjective: CC:  No new complaints. Wants to go home. Says he refused TEE yesterday.   Objective: Vital signs in last 24 hours: Temp:  [97.7 F (36.5 C)-98.3 F (36.8 C)] 98.3 F (36.8 C) (06/04 0724) Pulse Rate:  [69-87] 69 (06/04 0724) Resp:  [16-20] 17 (06/04 0724) BP: (114-149)/(66-90) 139/79 (06/04 0724) SpO2:  [94 %-98 %] 97 % (06/04 0724) Last BM Date : 05/20/23  Intake/Output from previous day: 06/03 0701 - 06/04 0700 In: 390 [P.O.:390] Out: 1601 [Urine:1600; Stool:1] Intake/Output this shift: No intake/output data recorded.  PE: Gen:  Alert, NAD, pleasant Pulm:  Normal effort ORA Abd: Soft, non-tender, non-distended Skin: abscess of right mid/upper back with about 3 cm of surrounding cellulitis, improved  compared to prior marking, stable. Wound bed c/d/I.  Psych: A&Ox3   Lab Results:  Recent Labs    05/22/23 0338 05/23/23 0200  WBC 9.1 9.0  HGB 10.3* 10.7*  HCT 31.8* 33.1*  PLT 241 255   BMET Recent Labs    05/22/23 0338 05/23/23 0200  NA 129* 133*  K 4.6 4.2  CL 97* 98  CO2 22 24  GLUCOSE 376* 244*  BUN 19 21  CREATININE 1.55* 1.52*  CALCIUM 8.1* 8.3*   PT/INR No results for input(s): "LABPROT", "INR" in the last 72 hours.  CMP     Component Value Date/Time   NA 133 (L) 05/23/2023 0200   NA 136 03/29/2021 0924   K 4.2 05/23/2023 0200   CL 98 05/23/2023 0200   CO2 24 05/23/2023 0200   GLUCOSE 244 (H) 05/23/2023 0200   BUN 21 05/23/2023 0200   BUN 14 03/29/2021 0924   CREATININE 1.52 (H) 05/23/2023 0200   CALCIUM 8.3 (L) 05/23/2023 0200   PROT 7.2 05/19/2023 1905   ALBUMIN 2.2 (L) 05/19/2023 1905   AST 7 (L) 05/19/2023 1905   ALT 9 05/19/2023 1905   ALKPHOS 108 05/19/2023 1905   BILITOT 0.6 05/19/2023 1905   GFRNONAA 52 (L) 05/23/2023 0200   GFRAA 125 12/28/2020 1037   Lipase  No results found for: "LIPASE"     Studies/Results: ECHOCARDIOGRAM COMPLETE  Result  Date: 05/22/2023    ECHOCARDIOGRAM REPORT   Patient Name:   TRACI KINGSBURY Date of Exam: 05/22/2023 Medical Rec #:  [id number]     Height:       68.0 in Accession #:    [id number]    Weight:       242.9 lb Date of Birth:  05-20-1961     BSA:          2.220 m Patient Age:    62 years      BP:           125/90 mmHg Patient Gender: M             HR:           77 bpm. Exam Location:  Inpatient Procedure: 2D Echo, Color Doppler and Cardiac Doppler Indications:    Bacteremia  History:        Patient has prior history of Echocardiogram examinations, most                 recent 12/11/2020. HFpEF, COPD, PAD and Stroke; Risk                 Factors:Diabetes. Chronic respiratory failure.  Sonographer:    Milda Smart Referring Phys: [id number] CHELSEA A CONNOR  Sonographer Comments: Image acquisition challenging due to respiratory motion. IMPRESSIONS  1. Left ventricular ejection fraction, by estimation, is 50%. The left ventricle has low normal function. The left ventricle has no regional wall motion abnormalities. There is mild concentric left ventricular hypertrophy. Left ventricular diastolic parameters were normal.  2. Right ventricular systolic function is normal. The right ventricular size is normal.  3. Left atrial size was mildly dilated.  4. The mitral valve is normal in structure. Mild mitral valve regurgitation. No evidence of mitral stenosis.  5. The aortic valve is tricuspid. There is mild calcification of the aortic valve. Aortic valve regurgitation is not visualized. No aortic stenosis is present.  6. Aortic dilatation noted. There is mild dilatation of the ascending aorta, measuring 42 mm.  7. The inferior vena cava is normal in size with greater than 50% respiratory variability, suggesting right atrial pressure of 3 mmHg. Conclusion(s)/Recommendation(s): No evidence of valvular vegetations on this transthoracic echocardiogram. Consider a transesophageal echocardiogram to exclude infective endocarditis if  clinically indicated. FINDINGS  Left Ventricle: Left ventricular ejection fraction, by estimation, is 50%. The left ventricle has low normal function. The left ventricle has no regional wall motion abnormalities. The left ventricular internal cavity size was normal in size. There is mild concentric left ventricular hypertrophy. Left ventricular diastolic parameters were normal. Right Ventricle: The right ventricular size is normal. No increase in right ventricular wall thickness. Right ventricular systolic function is normal. Left Atrium: Left atrial size was mildly dilated. Right Atrium: Right atrial size was normal in size. Pericardium: There is no evidence of pericardial effusion. Mitral Valve: The mitral valve is normal in structure. Mild mitral valve regurgitation. No evidence of mitral valve stenosis. MV peak gradient, 6.7 mmHg. The mean mitral valve gradient is 3.0 mmHg. Tricuspid Valve: The tricuspid valve is normal in structure. Tricuspid valve regurgitation is trivial. No evidence of tricuspid stenosis. Aortic Valve: The aortic valve is tricuspid. There is mild calcification of the aortic valve. Aortic valve regurgitation is not visualized. No aortic stenosis is present. Pulmonic Valve: The pulmonic valve was normal in structure. Pulmonic valve regurgitation is trivial. No evidence of pulmonic stenosis. Aorta: Aortic dilatation noted. There is mild dilatation of the ascending aorta, measuring 42 mm. Venous: The inferior vena cava is normal in size with greater than 50% respiratory variability, suggesting right atrial pressure of 3 mmHg. IAS/Shunts: No atrial level shunt detected by color flow Doppler.  LEFT VENTRICLE PLAX 2D LVIDd:         5.20 cm      Diastology LVIDs:         4.10 cm      LV e' medial:    5.87 cm/s LV PW:         1.20 cm      LV E/e' medial:  17.5 LV IVS:        1.20 cm      LV e' lateral:   7.83 cm/s LVOT diam:     2.00 cm      LV E/e' lateral: 13.2 LV SV:         72 LV SV Index:   33  LVOT Area:     3.14 cm  LV Volumes (MOD) LV vol d, MOD A2C: 148.0 ml LV vol d, MOD A4C: 184.0 ml LV vol s, MOD A2C: 84.7 ml LV vol s, MOD A4C: 86.8 ml LV SV MOD A2C:     63.3 ml LV SV MOD A4C:  184.0 ml LV SV MOD BP:      82.2 ml RIGHT VENTRICLE             IVC RV S prime:     14.30 cm/s  IVC diam: 2.30 cm TAPSE (M-mode): 2.4 cm LEFT ATRIUM             Index        RIGHT ATRIUM          Index LA diam:        4.20 cm 1.89 cm/m   RA Area:     9.72 cm LA Vol (A2C):   58.2 ml 26.22 ml/m  RA Volume:   13.90 ml 6.26 ml/m LA Vol (A4C):   48.4 ml 21.80 ml/m LA Biplane Vol: 53.3 ml 24.01 ml/m  AORTIC VALVE LVOT Vmax:   107.00 cm/s LVOT Vmean:  78.300 cm/s LVOT VTI:    0.230 m  AORTA Ao Root diam: 3.40 cm Ao Asc diam:  4.20 cm MITRAL VALVE                TRICUSPID VALVE MV Area (PHT): 3.16 cm     TR Peak grad:   15.1 mmHg MV Area VTI:   1.69 cm     TR Vmax:        194.00 cm/s MV Peak grad:  6.7 mmHg MV Mean grad:  3.0 mmHg     SHUNTS MV Vmax:       1.29 m/s     Systemic VTI:  0.23 m MV Vmean:      88.8 cm/s    Systemic Diam: 2.00 cm MV Decel Time: 240 msec MR Peak grad: 53.3 mmHg MR Vmax:      365.00 cm/s MV E velocity: 103.00 cm/s MV A velocity: 86.80 cm/s MV E/A ratio:  1.19 Arvilla Meres MD Electronically signed by Arvilla Meres MD Signature Date/Time: 05/22/2023/11:17:41 AM    Final     Anti-infectives: Anti-infectives (From admission, onward)    Start     Dose/Rate Route Frequency Ordered Stop   05/21/23 2000  vancomycin (VANCOREADY) IVPB 1750 mg/350 mL  Status:  Discontinued        1,750 mg 175 mL/hr over 120 Minutes Intravenous Every 48 hours 05/19/23 2256 05/20/23 1211   05/20/23 2200  ceFAZolin (ANCEF) IVPB 2g/100 mL premix        2 g 200 mL/hr over 30 Minutes Intravenous Every 8 hours 05/20/23 1212     05/20/23 0800  cefTRIAXone (ROCEPHIN) 2 g in sodium chloride 0.9 % 100 mL IVPB  Status:  Discontinued        2 g 200 mL/hr over 30 Minutes Intravenous Every 24 hours 05/19/23 2302  05/20/23 1211   05/19/23 2000  ceFEPIme (MAXIPIME) 2 g in sodium chloride 0.9 % 100 mL IVPB        2 g 200 mL/hr over 30 Minutes Intravenous  Once 05/19/23 1945 05/19/23 2020   05/19/23 2000  vancomycin (VANCOREADY) IVPB 2000 mg/400 mL        2,000 mg 200 mL/hr over 120 Minutes Intravenous  Once 05/19/23 1952 05/19/23 2220        Assessment/Plan Right back abscess S/p I&D 6/2 Dr. Fredricka Bonine, wound cx pending, GS w. Moderate GPC in clusters  - wound cultures: moderate staph aureus - MSSA bacteremia -  2D echo performed yesterday, TEE recommended by ID to r/o endocarditis but per patient he refused, on cefazolin per ID. - daily dressing changes with moist-to-dry  dressings, patient may shower  - ok for lovenox/SQH from surgical standpoint  -no further surgical needs. Outpatient follow up for wound check arranged. CCS will sign off. Abx per ID.    LOS: 4 days   I reviewed nursing notes, hospitalist notes, last 24 h vitals and pain scores, last 48 h intake and output, last 24 h labs and trends, and last 24 h imaging results.  This care required straight-forward level of medical decision making.   Hosie Spangle, PA-C [facility] Please see Amion for pager number during day hours 7:00am-4:30pm

## 2023-05-23 NOTE — TOC Benefit Eligibility Note (Signed)
Patient Product/process development scientist completed.    The patient is currently admitted and upon discharge could be taking Dexcom G7 Sensors.  Requires Prior Authorization  The patient is currently admitted and upon discharge could be taking [employer].  Requires Prior Authorization  The patient is insured through [facility] Medicaid   This test claim was processed through [facility]- copay amounts may vary at other pharmacies due to pharmacy/plan contracts, or as the patient moves through the different stages of their insurance plan.  Roland Earl, CPHT Pharmacy Patient Advocate Specialist [facility] Patient Advocate Team Direct Number: [phone]  Fax: [phone]

## 2023-05-24 ENCOUNTER — Encounter (HOSPITAL_COMMUNITY)
Admission: EM | Disposition: A | Discharge status: Home Health | Payer: Self-pay | Source: Home / Self Care | Attending: Internal Medicine

## 2023-05-24 ENCOUNTER — Other Ambulatory Visit: Payer: Self-pay

## 2023-05-24 DIAGNOSIS — B9561 Methicillin susceptible Staphylococcus aureus infection as the cause of diseases classified elsewhere: Secondary | ICD-10-CM | POA: Diagnosis not present

## 2023-05-24 DIAGNOSIS — R7881 Bacteremia: Secondary | ICD-10-CM | POA: Diagnosis not present

## 2023-05-24 LAB — CULTURE, BLOOD (ROUTINE X 2)
Culture: NO GROWTH
Special Requests: ADEQUATE

## 2023-05-24 LAB — GLUCOSE, CAPILLARY
Glucose-Capillary: 196 mg/dL — ABNORMAL HIGH (ref 70–99)
Glucose-Capillary: 238 mg/dL — ABNORMAL HIGH (ref 70–99)
Glucose-Capillary: 147 mg/dL — ABNORMAL HIGH (ref 70–99)
Glucose-Capillary: 161 mg/dL — ABNORMAL HIGH (ref 70–99)

## 2023-05-24 LAB — AEROBIC/ANAEROBIC CULTURE W GRAM STAIN (SURGICAL/DEEP WOUND)

## 2023-05-24 SURGERY — ECHOCARDIOGRAM, TRANSESOPHAGEAL
Anesthesia: Monitor Anesthesia Care

## 2023-05-24 MED ORDER — INSULIN GLARGINE-YFGN 100 UNIT/ML ~~LOC~~ SOLN
75.0000 [IU] | Freq: Two times a day (BID) | SUBCUTANEOUS | Status: DC
  Administered 2023-05-24 – 2023-05-25 (×2): 75 [IU] via SUBCUTANEOUS
  Filled 2023-05-24 (×3): qty 0.75

## 2023-05-24 MED ORDER — UMECLIDINIUM BROMIDE 62.5 MCG/ACT IN AEPB
1.0000 | INHALATION_SPRAY | Freq: Every day | RESPIRATORY_TRACT | Status: DC
  Administered 2023-05-25: 1 via RESPIRATORY_TRACT
  Filled 2023-05-24: qty 7

## 2023-05-24 MED ORDER — TIOTROPIUM BROMIDE MONOHYDRATE 18 MCG IN CAPS
18.0000 ug | ORAL_CAPSULE | Freq: Every day | RESPIRATORY_TRACT | Status: DC
  Filled 2023-05-24: qty 5

## 2023-05-24 MED ORDER — SODIUM CHLORIDE 0.9% FLUSH: 10.0000 mL | INTRAVENOUS | Status: DC | PRN

## 2023-05-24 MED ORDER — ACETAMINOPHEN 325 MG PO TABS: 650.0000 mg | ORAL_TABLET | Freq: Four times a day (QID) | ORAL | Status: DC | PRN

## 2023-05-24 NOTE — Progress Notes (Signed)
Peripherally Inserted Central Catheter Placement  The IV Nurse has discussed with the patient and/or persons authorized to consent for the patient, the purpose of this procedure and the potential benefits and risks involved with this procedure.  The benefits include less needle sticks, lab draws from the catheter, and the patient may be discharged home with the catheter. Risks include, but not limited to, infection, bleeding, blood clot (thrombus formation), and puncture of an artery; nerve damage and irregular heartbeat and possibility to perform a PICC exchange if needed/ordered by physician.  Alternatives to this procedure were also discussed.  Bard Power PICC patient education guide, fact sheet on infection prevention and patient information card has been provided to patient /or left at bedside.    PICC Placement Documentation  PICC Single Lumen 05/24/23 Right Basilic 42 cm 0 cm (Active)  Indication for Insertion or Continuance of Line Home intravenous therapies (PICC only) 05/24/23 1049  Exposed Catheter (cm) 0 cm 05/24/23 1049  Site Assessment Clean, Dry, Intact 05/24/23 1049  Line Status Blood return noted;Saline locked;Flushed 05/24/23 1049  Dressing Type Transparent;Securing device 05/24/23 1049  Dressing Status Antimicrobial disc in place 05/24/23 1049  Safety Lock Not Applicable 05/24/23 1049  Line Adjustment (NICU/IV Team Only) No 05/24/23 1049  Dressing Change Due 05/31/23 05/24/23 1049       Romie Jumper 05/24/2023, 10:52 AM

## 2023-05-24 NOTE — TOC Transition Note (Incomplete)
Transition of Care [facility]) - CM/SW Discharge Note   Patient Details  Name: Samuel Bailey MRN: [id number] Date of Birth: [birth year]  Transition of Care [facility]) CM/SW Contact:  Epifanio Lesches, RN Phone Number: 05/24/2023, 9:55 AM   Clinical Narrative:    Patient will DC to: home Anticipated DC date: 05/24/2023 Family notified: yes Transport by: car       - s/p debridement of right upper back abscess  Per MD patient ready for DC today. Pt will be ready for d/c after 2pm  ABX therapy.   Pt  requiring home infusion, LT IV ABX therapy. RN, patient, patient's family, and Pam/[facility] notified of DC.   Pt without RX med concerns. States family to provide transportation to home. Post hospital f/u noted on AVS.  RNCM will sign off for now as intervention is no longer needed. Please consult [place] again if new needs arise.    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 per patient, he has all needed DME)          DME Arranged: N/A         HH Arranged: IV Antibiotics, RN HH Agency: [facility] Date HH Agency Contacted: 05/23/23 Time HH Agency Contacted: [phone] Representative spoke with at [facility] Agency: Pam  Social Determinants of Health (SDOH) Interventions SDOH Screenings   Food Insecurity: No Food Insecurity (05/20/2023)  Housing: Low Risk  (05/20/2023)  Transportation Needs: Unmet Transportation Needs (05/20/2023)  Utilities: Not At Risk (05/20/2023)  Tobacco Use: High Risk (05/22/2023)      Readmission Risk Interventions     No data to display

## 2023-05-24 NOTE — Progress Notes (Signed)
PT Cancellation Note  Patient Details Name: Samuel Bailey MRN: [id number] DOB: 07-28-1961   Cancelled Treatment:    Reason Eval/Treat Not Completed: PT screened, no needs identified, will sign off; noted OT stated pt at baseline, but he was concerned as pt LOB with crutches.  Upon speaking with pt he relates he does note walk with the crutches at home, uses furniture/appliances that ar very close together to move around in the bathroom and feels he is at his functional baseline.  PT will sign off.    Elray Mcgregor 05/24/2023, 2:33 PM Sheran Lawless, PT Acute Rehabilitation Services Office:[phone] 05/24/2023

## 2023-05-24 NOTE — Evaluation (Signed)
Occupational Therapy Evaluation Patient Details Name: Samuel Bailey MRN: [id number] DOB: 1961-11-07 Today's Date: 05/24/2023   History of Present Illness 62 y.o. 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 Pt currently completing lower body bADLs with supervision, setup and ambulating min guard using crutches. Pt presenting at or near functional baseline, pt has no further acute skilled OT needs. No follow-up OT recommend at this time.       Recommendations for follow up therapy are one component of a multi-disciplinary discharge planning process, led by the attending physician.  Recommendations may be updated based on patient status, additional functional criteria and insurance authorization.   Assistance Recommended at Discharge Set up Supervision/Assistance  Patient can return home with the following A little help with walking and/or transfers;Assistance with cooking/housework    Functional Status Assessment  Patient has not had a recent decline in their functional status  Equipment Recommendations  None recommended by OT (Pt has rec DME)    Recommendations for Other Services       Precautions / Restrictions Restrictions Weight Bearing Restrictions: No      Mobility Bed Mobility Overal bed mobility: Modified Independent                  Transfers Overall transfer level: Needs  assistance Equipment used: Crutches Transfers: Sit to/from Stand, Bed to chair/wheelchair/BSC Sit to Stand: Min guard     Step pivot transfers: Min guard            Balance Overall balance assessment: Needs assistance Sitting-balance support: Feet supported Sitting balance-Leahy Scale: Fair     Standing balance support: During functional activity, Bilateral upper extremity supported Standing balance-Leahy Scale: Poor Standing balance comment: crutches                           ADL either performed or assessed with clinical judgement   ADL Overall ADL's : Needs assistance/impaired;At baseline Eating/Feeding: Independent;Sitting   Grooming: Standing;Min guard   Upper Body Bathing: Sitting;Independent   Lower Body Bathing: Sitting/lateral leans;Supervison/ safety;Set up   Upper Body Dressing : Sitting;Independent   Lower Body Dressing: Sitting/lateral leans;Supervision/safety;Set up Lower Body Dressing Details (indicate cue type and reason): donned R shoe Toilet Transfer: Ambulation;Min guard   Toileting- Clothing Manipulation and Hygiene: Sitting/lateral lean;Supervision/safety   Tub/ Shower Transfer: Min guard;Ambulation   Functional mobility during ADLs: Min guard (+ crutches)       Vision         Perception     Praxis      Pertinent Vitals/Pain Pain Assessment Pain Assessment: Faces Faces Pain Scale: Hurts a little bit Pain Location: Back, incision site Pain Descriptors / Indicators: Discomfort, Sore Pain Intervention(s): Monitored during session, Repositioned     Hand Dominance Right   Extremity/Trunk Assessment Upper Extremity Assessment Upper Extremity Assessment: Overall WFL for tasks assessed   Lower Extremity Assessment Lower Extremity Assessment: Defer to  PT evaluation LLE Deficits / Details: L AKA       Communication Communication Communication: No difficulties   Cognition Arousal/Alertness: Awake/alert Behavior  During Therapy: WFL for tasks assessed/performed Overall Cognitive Status: Within Functional Limits for tasks assessed                                       General Comments  VSS on RA    Exercises     Shoulder Instructions      Home Living Family/patient expects to be discharged to:: Private residence Living Arrangements: Other relatives;Children (niece and grandson) Available Help at Discharge: Family;Available PRN/intermittently Type of Home: Mobile home Home Access: Ramped entrance     Home Layout: One level     Bathroom Shower/Tub: Chief Strategy Officer: Standard     Home Equipment: Shower seat;BSC/3in1;Crutches;Wheelchair - power;Wheelchair - Forensic psychologist (2 wheels)   Additional Comments: Can stand up and can get around on crutches if needed.      Prior Functioning/Environment Prior Level of Function : Driving             Mobility Comments: Uses power w/c at baseline, does not walk with prosthesis due to neuropathy ADLs Comments: Karma Greaser friend cleans his house, otherwise ind for bADLs and cleaning        OT Problem List: Impaired balance (sitting and/or standing)      OT Treatment/Interventions:      OT Goals(Current goals can be found in the care plan section) Acute Rehab OT Goals Patient Stated Goal: To go home OT Goal Formulation: With patient Time For Goal Achievement: 06/07/23 Potential to Achieve Goals: Good  OT Frequency:      Co-evaluation              AM-PAC OT "6 Clicks" Daily Activity     Outcome Measure Help from another person eating meals?: None Help from another person taking care of personal grooming?: None Help from another person toileting, which includes using toliet, bedpan, or urinal?: A Little Help from another person bathing (including washing, rinsing, drying)?: A Little Help from another person to put on and taking off regular upper body clothing?: None Help from another person  to put on and taking off regular lower body clothing?: A Little 6 Click Score: 21   End of Session Equipment Utilized During Treatment: Gait belt;Other (comment) (crutches) Nurse Communication: Mobility status  Activity Tolerance: Patient tolerated treatment well Patient left: in chair;with call bell/phone within reach;with chair alarm set  OT Visit Diagnosis: Unsteadiness on feet (R26.81)                Time: 1610-9604 OT Time Calculation (min): 25 min Charges:  OT General Charges $OT Visit: 1 Visit OT Evaluation $OT Eval Low Complexity: 1 Low OT Treatments $Therapeutic Activity: 8-22 mins  05/24/2023  AB, OTR/L  Acute Rehabilitation Services  Office: [phone]   Tristan Schroeder 05/24/2023, 10:10 AM

## 2023-05-24 NOTE — Progress Notes (Signed)
Samuel Bailey  FAO:[id number] DOB: [birth year] DOA: 05/19/2023 PCP: Wilmer Floor., MD    Brief Narrative:  62 year old with a history of uncontrolled DM2, COPD with chronic hypoxic respiratory failure, CAD, chronic diastolic CHF, PAD, prior CVA, and left foot diabetic infection status post AKA who reported to the ER 05/19/2023 with generalized malaise loss of appetite and an abscess on his back.  This had progressed from a small nodule which had been itchy and was first noted around 2 weeks ago.  Consultants:  None  Goals of Care:  Code Status: Full Code   DVT prophylaxis: Subcutaneous heparin  Interim Hx: Afebrile.  Vital signs stable.  CBG improving.  No new complaints today.  Assessment & Plan:  MSSA cellulitis and abscess of the back with bacteremia Status post debridement of wound by general surgery 05/21/2023 -ID evaluation led to suggestion for TEE which patient refused -plan is for PICC line placement with 6 weeks of cefazolin per ID -continue wound care per General Surgery with moist to dry dressings daily -outpatient follow-up with general surgery arranged by their service prior to discharge  Acute kidney injury Creatinine 1.62 on admission up from baseline 1.0 to -felt to be due to loss of appetite with poor intake -creatinine presently trending downward  Insulin-dependent DM 2 -uncontrolled with hyperglycemia A1c 12.23 December 2021 and 12.9 05/20/2023 -CBG controlled improving presently  COPD with chronic hypoxic respiratory failure No acute exacerbation this admission -continue usual home dose of supplemental oxygen  CAD Asymptomatic -continue aspirin Lipitor and metoprolol  Chronic diastolic congestive heart failure Hypovolemic at present -diuretic on hold  Hyponatremia Due to hypovolemia -improved with volume resuscitation  History of CVA Continue aspirin and Lipitor   Family Communication:  Disposition: Home with IV antibiotic therapy per PICC  line   Objective: Blood pressure 138/87, pulse 71, temperature 98 F (36.7 C), temperature source Oral, resp. rate 18, height 5' 7.99" (1.727 m), weight 110.2 kg, SpO2 99 %.  Intake/Output Summary (Last 24 hours) at 05/24/2023 1045 Last data filed at 05/24/2023 0751 Gross per 24 hour  Intake 900 ml  Output 4400 ml  Net -3500 ml   Filed Weights   05/20/23 0500 05/21/23 1208 05/22/23 0500  Weight: 109.7 kg 109.7 kg 110.2 kg    Examination: General: No acute respiratory distress Lungs: Clear to auscultation bilaterally without wheezes or crackles Cardiovascular: Regular rate and rhythm without murmur gallop or rub normal S1 and S2 Abdomen: Nontender, nondistended, soft, bowel sounds positive, no rebound, no ascites, no appreciable mass Extremities: No significant cyanosis, clubbing, or edema bilateral lower extremities  CBC: Recent Labs  Lab 05/19/23 1905 05/20/23 0603 05/21/23 0705 05/22/23 0338 05/23/23 0200  WBC 14.7*   < > 9.1 9.1 9.0  NEUTROABS 11.5*  --   --   --   --   HGB 13.0   < > 10.8* 10.3* 10.7*  HCT 39.6   < > 33.0* 31.8* 33.1*  MCV 85.3   < > 87.5 85.3 87.6  PLT 273   < > 213 241 255   < > = values in this interval not displayed.   Basic Metabolic Panel: Recent Labs  Lab 05/21/23 0705 05/22/23 0338 05/23/23 0200  NA 132* 129* 133*  K 3.8 4.6 4.2  CL 100 97* 98  CO2 19* 22 24  GLUCOSE 202* 376* 244*  BUN 13 19 21   CREATININE 1.58* 1.55* 1.52*  CALCIUM 8.0* 8.1* 8.3*   GFR: Estimated Creatinine Clearance: 61.4  mL/min (A) (by C-G formula based on SCr of 1.52 mg/dL (H)).   Scheduled Meds:  aspirin EC  81 mg Oral Daily   atorvastatin  40 mg Oral Daily   Chlorhexidine Gluconate Cloth  6 each Topical Q0600   Chlorhexidine Gluconate Cloth  6 each Topical Q0600   gabapentin  300 mg Oral TID   heparin  5,000 Units Subcutaneous Q8H   insulin aspart  0-20 Units Subcutaneous TID WC   insulin aspart  0-5 Units Subcutaneous QHS   insulin aspart  10 Units  Subcutaneous TID WC   insulin glargine-yfgn  70 Units Subcutaneous BID   isosorbide mononitrate  30 mg Oral Daily   metoprolol succinate  12.5 mg Oral Daily   mupirocin ointment  1 Application Nasal BID   senna-docusate  1 tablet Oral Daily   sodium chloride flush  3 mL Intravenous Q12H   Continuous Infusions:   ceFAZolin (ANCEF) IV 2 g (05/24/23 0621)     LOS: 5 days   Lonia Blood, MD [facility] Office  [phone] Pager - Text Page per Loretha Stapler  If 7PM-7AM, please contact night-coverage per Amion 05/24/2023, 10:45 AM

## 2023-05-24 NOTE — Inpatient Diabetes Management (Signed)
Inpatient Diabetes Program Recommendations  AACE/ADA: New Consensus Statement on Inpatient Glycemic Control   Target Ranges:  Prepandial:   less than 140 mg/dL      Peak postprandial:   less than 180 mg/dL (1-2 hours)      Critically ill patients:  140 - 180 mg/dL    Latest Reference Range & Units 05/23/23 07:20 05/23/23 11:21 05/23/23 16:29 05/23/23 21:07 05/24/23 08:51  Glucose-Capillary 70 - 99 mg/dL 161 (H) 096 (H) 045 (H) 233 (H) 196 (H)   Review of Glycemic Control  Diabetes history: DM2 Outpatient Diabetes medications: Lantus 60 units BID, Novolog 6-10 units 3-4 times a day Current orders for Inpatient glycemic control: Semglee 70 units BID, Novolog 0-20 units TID with meals, Novolog 0-5 units QHS, Novolog 10 units TID with meals   Inpatient Diabetes Program Recommendations:     Insulin: Please consider increasing Semglee to 72 units BID and meal coverage to Novolog 12 units TID with meals.  HbgA1C: A1C 12.9% on 05/20/23 indicating average glucose of 324 mg/dl over the past 2-3 months. Would recommend adjusting outpatient insulin dosages (to reflect inpatient insulin regimen if glucose well controlled) at time of discharge.  Thanks, Orlando Penner, RN, MSN, CDCES Diabetes Coordinator Inpatient Diabetes Program [phone] (Team Pager from 8am to 5pm)

## 2023-05-25 DIAGNOSIS — R7881 Bacteremia: Secondary | ICD-10-CM | POA: Diagnosis not present

## 2023-05-25 DIAGNOSIS — B9561 Methicillin susceptible Staphylococcus aureus infection as the cause of diseases classified elsewhere: Secondary | ICD-10-CM | POA: Diagnosis not present

## 2023-05-25 LAB — BASIC METABOLIC PANEL
Anion gap: 8 (ref 5–15)
BUN: 20 mg/dL (ref 8–23)
CO2: 25 mmol/L (ref 22–32)
Calcium: 8.5 mg/dL — ABNORMAL LOW (ref 8.9–10.3)
Chloride: 100 mmol/L (ref 98–111)
Creatinine, Ser: 1.35 mg/dL — ABNORMAL HIGH (ref 0.61–1.24)
GFR, Estimated: 60 mL/min — ABNORMAL LOW (ref >60)
Glucose, Bld: 148 mg/dL — ABNORMAL HIGH (ref 70–99)
Potassium: 4.5 mmol/L (ref 3.5–5.1)
Sodium: 133 mmol/L — ABNORMAL LOW (ref 135–145)

## 2023-05-25 LAB — CBC
HCT: 34.6 % — ABNORMAL LOW (ref 39.0–52.0)
Hemoglobin: 10.9 g/dL — ABNORMAL LOW (ref 13.0–17.0)
MCH: 27.7 pg (ref 26.0–34.0)
MCHC: 31.5 g/dL (ref 30.0–36.0)
MCV: 87.8 fL (ref 80.0–100.0)
Platelets: 285 10*3/uL (ref 150–400)
RBC: 3.94 MIL/uL — ABNORMAL LOW (ref 4.22–5.81)
RDW: 12.9 % (ref 11.5–15.5)
WBC: 11 10*3/uL — ABNORMAL HIGH (ref 4.0–10.5)
nRBC: 0.4 % — ABNORMAL HIGH (ref 0.0–0.2)

## 2023-05-25 LAB — GLUCOSE, CAPILLARY
Glucose-Capillary: 207 mg/dL — ABNORMAL HIGH (ref 70–99)
Glucose-Capillary: 149 mg/dL — ABNORMAL HIGH (ref 70–99)

## 2023-05-25 LAB — CULTURE, BLOOD (ROUTINE X 2)

## 2023-05-25 MED ORDER — METOPROLOL SUCCINATE ER 25 MG PO TB24: ORAL_TABLET | Freq: Every day | ORAL | 3 refills | Status: AC

## 2023-05-25 MED ORDER — SENNOSIDES-DOCUSATE SODIUM 8.6-50 MG PO TABS: 1.0000 | ORAL_TABLET | Freq: Every day | ORAL | Status: AC

## 2023-05-25 MED ORDER — OXYCODONE HCL 5 MG PO TABS: 5.0000 mg | ORAL_TABLET | ORAL | 0 refills | Status: DC | PRN

## 2023-05-25 MED ORDER — INSULIN GLARGINE 100 UNITS/ML SOLOSTAR PEN: 75.0000 [IU] | PEN_INJECTOR | Freq: Two times a day (BID) | SUBCUTANEOUS | 11 refills | Status: AC

## 2023-05-25 MED ORDER — INSULIN ASPART 100 UNIT/ML IJ SOLN: 10.0000 [IU] | Freq: Three times a day (TID) | INTRAMUSCULAR | 11 refills | Status: AC

## 2023-05-25 MED ORDER — ACETAMINOPHEN 325 MG PO TABS: 650.0000 mg | ORAL_TABLET | Freq: Four times a day (QID) | ORAL | Status: AC | PRN

## 2023-05-25 MED ORDER — CEFAZOLIN IV (FOR PTA / DISCHARGE USE ONLY): 2.0000 g | Freq: Three times a day (TID) | INTRAVENOUS | 0 refills | Status: AC

## 2023-05-25 NOTE — Plan of Care (Signed)
\   Problem: Clinical Measurements: Goal: Diagnostic test results will improve Outcome: Progressing   Problem: Education: Goal: Ability to describe self-care measures that may prevent or decrease complications (Diabetes Survival Skills Education) will improve Outcome: Progressing   Problem: Fluid Volume: Goal: Ability to maintain a balanced intake and output will improve Outcome: Progressing   Problem: Nutritional: Goal: Maintenance of adequate nutrition will improve Outcome: Progressing   Problem: Education: Goal: Knowledge of General Education information will improve Description: Including pain rating scale, medication(s)/side effects and non-pharmacologic comfort measures Outcome: Progressing

## 2023-05-25 NOTE — Progress Notes (Signed)
CSW met with pt regarding SDOH: Transportation.  Pt reports he does have problems getting to MD appts sometimes.  He has a friend who can take him if he is not at work.  CSW discussed medicaid transportation program with pt, provided transportation resource list with medicaid transport section highlighted, explained to pt needing to get signed up and then calling in advance for rides to medical appts.  Pt verbalized understanding, agreeable to follow up from home. Daleen Squibb, MSW, LCSW 6/6/20241:34 PM

## 2023-05-25 NOTE — Discharge Summary (Signed)
DISCHARGE SUMMARY  Samuel Bailey  MR#: [id number]  DOB:30-Jul-1961  Date of Admission: 05/19/2023 Date of Discharge: 05/25/2023  Attending Physician:Janathan Bribiesca Silvestre Gunner, MD  Patient's FAO:[id number], Mila Homer., MD  Consults: ID Gen Surgery   Disposition: D/C home   Follow-up Appts:  Follow-up Information     Wilmer Floor., MD Follow up in 1 week(s).   Specialty: Internal Medicine Contact information: [address] [place] Kn[place] [address] [phone]         [facility] Follow up.   Specialty: General Surgery Why: The office will call you to arrange for a follow up check of your wound. Contact information: [address] [place] [address] [phone]         Judyann Munson, MD Follow up.   Specialty: Infectious Diseases Why: Follow up as directed by Dr. Drue Second. Contact information: [address] [place] [address] [phone]                 Tests Needing Follow-up: -close follow-up of CBGs in outpatient setting is recommended with a probable need to further increase his insulin regimen -monitor closely for signs of persisting infection despite antibiotics which would be indicative of bacterial endocarditis (patient refused TEE this admission)   Wound Care: -daily dressing changes with moist-to-dry dressings, patient may shower   Discharge Diagnoses: MSSA cellulitis and bacteremia - abscess of the R shoulder/back  Acute kidney injury Insulin-dependent DM 2 -uncontrolled with hyperglycemia COPD with chronic hypoxic respiratory failure CAD Chronic diastolic congestive heart failure Hyponatremia History of thrombotic CVA  Initial presentation: 62 year old with a history of uncontrolled DM2, COPD with chronic hypoxic respiratory failure, CAD, chronic diastolic CHF, PAD, prior CVA, and 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 This had progressed from a small nodule which had been itchy and was first noted around 2 weeks prior.   Hospital Course:  MSSA cellulitis and bacteremia - abscess of the R shoulder/back  Status post debridement of wound by General Surgery 05/21/2023 -ID evaluation led to suggestion for TEE which patient refused -plan is for 6 weeks of cefazolin via PICC per ID -continue wound care per General Surgery with moist to dry dressings daily -outpatient follow-up with General Surgery arranged by their service prior to discharge - to f/u w/ ID in outpt setting for monitoring of abx tx -patient counseled to return to the ER for evaluation should he develop abrupt severe worsening of symptoms as this could be indicative of infective endocarditis with refractory bacteremia   Acute kidney injury Creatinine 1.62 on admission up from baseline 1.0 to - felt to be due to loss of appetite with poor intake -creatinine trending downward at time of discharge   Insulin-dependent DM 2 -uncontrolled with hyperglycemia A1c 12.23 December 2021 and 12.9 05/20/2023 -CBG controlled improved during this hospital stay -given markedly elevated A1c insulin regimen has been increased and will be continued at this higher level at time of discharge -close follow-up of CBGs in outpatient setting is recommended with a probable need to further increase his insulin regimen   COPD with chronic hypoxic respiratory failure No acute exacerbation this admission -continue usual home dose of supplemental oxygen   CAD Asymptomatic -continue aspirin Lipitor and metoprolol   Chronic diastolic congestive heart failure Hypovolemic at presentation - diuretic on hold throughout course of hospital stay  but will be resumed at time of discharge in anticipation of increased intake at home   Hyponatremia Due to hypovolemia - improved with volume resuscitation   History of CVA Continue  aspirin and Lipitor  Allergies as of 05/25/2023       Reactions   Lovastatin    diarrhea        Medication List     TAKE these medications    acetaminophen 325 MG tablet Commonly known as: TYLENOL Take 2 tablets (650 mg total) by mouth every 6 (six) hours as needed for mild pain, fever or headache.   aspirin EC 81 MG tablet Take 1 tablet (81 mg total) by mouth daily.   atorvastatin 40 MG tablet Commonly known as: LIPITOR Take 1 tablet (40 mg total) by mouth daily.   B-D ULTRAFINE III SHORT PEN 31G X 8 MM Misc Generic drug: Insulin Pen Needle Inject into the skin daily.   ceFAZolin  IVPB Commonly known as: ANCEF Inject 2 g into the vein every 8 (eight) hours. Indication:  MSSA bacteremia First Dose: Yes Last Day of Therapy:  07/02/23 Labs - Once weekly:  CBC/D and BMP, Labs - Once weekly: ESR and CRP Method of administration: IV Push Pull PICC line at the completion of IV antibiotic therapy Method of administration may be changed at the discretion of home infusion pharmacist based upon assessment of the patient and/or caregiver's ability to self-administer the medication ordered.   gabapentin 300 MG capsule Commonly known as: NEURONTIN Take 300 mg by mouth 3 (three) times daily.   insulin aspart 100 UNIT/ML injection Commonly known as: novoLOG Inject 6-10 Units into the skin 2 (two) times daily. Per sliding scale What changed: Another medication with the same name was added. Make sure you understand how and when to take each.   insulin aspart 100 UNIT/ML injection Commonly known as: novoLOG Inject 10 Units into the skin 3 (three) times daily with meals. What changed: You were already taking a medication with the same name, and this prescription was added. Make sure you understand how and when to take each.   insulin glargine 100 unit/mL Sopn Commonly known as: LANTUS Inject 75 Units into the skin 2 (two) times daily. What changed: how much to take   metoprolol  succinate 25 MG 24 hr tablet Commonly known as: TOPROL-XL TAKE 1/2 TABLET (12.5 MG TOTAL) BY MOUTH DAILY.   omeprazole 40 MG capsule Commonly known as: PRILOSEC Take 40 mg by mouth in the morning and at bedtime.   oxyCODONE 5 MG immediate release tablet Commonly known as: Oxy IR/ROXICODONE Take 1 tablet (5 mg total) by mouth every 4 (four) hours as needed for moderate pain.   senna-docusate 8.6-50 MG tablet Commonly known as: Senokot-S Take 1 tablet by mouth daily. Start taking on: May 26, 2023   tiotropium 18 MCG inhalation capsule Commonly known as: SPIRIVA Place 18 mcg into inhaler and inhale daily.               Discharge Care Instructions  (From admission, onward)           Start     Ordered   05/25/23 0000  Change dressing on IV access line weekly and PRN  (Home infusion instructions - [facility] )        05/25/23 0946            Day of Discharge BP (!) 146/71 (BP Location: Left Arm)   Pulse 78   Temp  98 F (36.7 C) (Oral)   Resp 18   Ht 5' 7.99" (1.727 m)   Wt 110.2 kg   SpO2 96%   BMI 36.95 kg/m   Physical Exam: General: No acute respiratory distress Lungs: Clear to auscultation bilaterally without wheezes or crackles Cardiovascular: Regular rate and rhythm without murmur gallop or rub normal S1 and S2 Abdomen: Nontender, nondistended, soft, bowel sounds positive, no rebound, no ascites, no appreciable mass Extremities: No significant cyanosis, clubbing, or edema bilateral lower extremities  Basic Metabolic Panel: Recent Labs  Lab 05/20/23 0603 05/21/23 0705 05/22/23 0338 05/23/23 0200 05/25/23 0345  NA 128* 132* 129* 133* 133*  K 3.3* 3.8 4.6 4.2 4.5  CL 97* 100 97* 98 100  CO2 21* 19* 22 24 25   GLUCOSE 187* 202* 376* 244* 148*  BUN 14 13 19 21 20   CREATININE 1.68* 1.58* 1.55* 1.52* 1.35*  CALCIUM 7.7* 8.0* 8.1* 8.3* 8.5*    CBC: Recent Labs  Lab 05/19/23 1905 05/20/23 0603 05/21/23 0705 05/22/23 0338  05/23/23 0200 05/25/23 0345  WBC 14.7* 16.4* 9.1 9.1 9.0 11.0*  NEUTROABS 11.5*  --   --   --   --   --   HGB 13.0 11.5* 10.8* 10.3* 10.7* 10.9*  HCT 39.6 33.7* 33.0* 31.8* 33.1* 34.6*  MCV 85.3 84.7 87.5 85.3 87.6 87.8  PLT 273 207 213 241 255 285    Time spent in discharge (includes decision making & examination of pt): 35 minutes  05/25/2023, 11:23 AM   Lonia Blood, MD [facility] Office  [phone]

## 2023-05-25 NOTE — Plan of Care (Signed)
  Problem: Fluid Volume: Goal: Hemodynamic stability will improve Outcome: Adequate for Discharge   Problem: Clinical Measurements: Goal: Diagnostic test results will improve Outcome: Adequate for Discharge Goal: Signs and symptoms of infection will decrease Outcome: Adequate for Discharge   Problem: Respiratory: Goal: Ability to maintain adequate ventilation will improve Outcome: Adequate for Discharge   Problem: Education: Goal: Ability to describe self-care measures that may prevent or decrease complications (Diabetes Survival Skills Education) will improve Outcome: Adequate for Discharge Goal: Individualized Educational Video(s) Outcome: Adequate for Discharge   Problem: Coping: Goal: Ability to adjust to condition or change in health will improve Outcome: Adequate for Discharge   Problem: Fluid Volume: Goal: Ability to maintain a balanced intake and output will improve Outcome: Adequate for Discharge   Problem: Health Behavior/Discharge Planning: Goal: Ability to identify and utilize available resources and services will improve Outcome: Adequate for Discharge Goal: Ability to manage health-related needs will improve Outcome: Adequate for Discharge   Problem: Metabolic: Goal: Ability to maintain appropriate glucose levels will improve Outcome: Adequate for Discharge   Problem: Nutritional: Goal: Maintenance of adequate nutrition will improve Outcome: Adequate for Discharge Goal: Progress toward achieving an optimal weight will improve Outcome: Adequate for Discharge   Problem: Skin Integrity: Goal: Risk for impaired skin integrity will decrease Outcome: Adequate for Discharge   Problem: Tissue Perfusion: Goal: Adequacy of tissue perfusion will improve Outcome: Adequate for Discharge   Problem: Education: Goal: Knowledge of General Education information will improve Description: Including pain rating scale, medication(s)/side effects and non-pharmacologic  comfort measures Outcome: Adequate for Discharge   Problem: Health Behavior/Discharge Planning: Goal: Ability to manage health-related needs will improve Outcome: Adequate for Discharge   Problem: Clinical Measurements: Goal: Ability to maintain clinical measurements within normal limits will improve Outcome: Adequate for Discharge Goal: Will remain free from infection Outcome: Adequate for Discharge Goal: Diagnostic test results will improve Outcome: Adequate for Discharge Goal: Respiratory complications will improve Outcome: Adequate for Discharge Goal: Cardiovascular complication will be avoided Outcome: Adequate for Discharge   Problem: Activity: Goal: Risk for activity intolerance will decrease Outcome: Adequate for Discharge   Problem: Nutrition: Goal: Adequate nutrition will be maintained Outcome: Adequate for Discharge   Problem: Coping: Goal: Level of anxiety will decrease Outcome: Adequate for Discharge   Problem: Elimination: Goal: Will not experience complications related to bowel motility Outcome: Adequate for Discharge Goal: Will not experience complications related to urinary retention Outcome: Adequate for Discharge   Problem: Pain Managment: Goal: General experience of comfort will improve Outcome: Adequate for Discharge   Problem: Safety: Goal: Ability to remain free from injury will improve Outcome: Adequate for Discharge   Problem: Skin Integrity: Goal: Risk for impaired skin integrity will decrease Outcome: Adequate for Discharge   

## 2023-05-25 NOTE — Discharge Instructions (Addendum)
Perform daily dressing changes with moist-to-dry dressings as educated by your nurse. You may shower as usual.

## 2023-05-26 LAB — CULTURE, BLOOD (ROUTINE X 2)
Culture: NO GROWTH
Culture: NO GROWTH

## 2023-05-26 LAB — AEROBIC/ANAEROBIC CULTURE W GRAM STAIN (SURGICAL/DEEP WOUND)

## 2023-06-12 ENCOUNTER — Other Ambulatory Visit: Payer: Self-pay

## 2023-06-12 ENCOUNTER — Encounter: Payer: Self-pay | Admitting: Internal Medicine

## 2023-06-12 ENCOUNTER — Ambulatory Visit (INDEPENDENT_AMBULATORY_CARE_PROVIDER_SITE_OTHER): Payer: Medicaid Other | Admitting: Internal Medicine

## 2023-06-12 VITALS — BP 170/90 | HR 95 | Resp 16 | Ht 67.9 in | Wt 242.9 lb

## 2023-06-12 DIAGNOSIS — B9561 Methicillin susceptible Staphylococcus aureus infection as the cause of diseases classified elsewhere: Secondary | ICD-10-CM

## 2023-06-12 DIAGNOSIS — L02212 Cutaneous abscess of back [any part, except buttock]: Secondary | ICD-10-CM

## 2023-06-12 DIAGNOSIS — R7881 Bacteremia: Secondary | ICD-10-CM | POA: Diagnosis not present

## 2023-06-12 MED ORDER — ONDANSETRON HCL 4 MG PO TABS: 4.0000 mg | ORAL_TABLET | Freq: Three times a day (TID) | ORAL | 1 refills | Status: AC | PRN

## 2023-06-12 NOTE — Progress Notes (Signed)
Patient ID: Samuel Bailey, male   DOB: 1961-04-05, 62 y.o.   MRN: [id number]  HPI  62yo M with hx of hospitalization for MSSA bacteremia in setting of large soft tissue infection/back abscess he is s/p Excision and debridement of right upper back abscess and soft tissue infection, wound 6 x 5 x 3 cm   Currently on cefazolin 2gm IV Q8hr through 07/02/2023. Grandson tending to his wound and dressing changes   ROS: + nausea  Outpatient Encounter Medications as of 06/12/2023  Medication Sig   acetaminophen (TYLENOL) 325 MG tablet Take 2 tablets (650 mg total) by mouth every 6 (six) hours as needed for mild pain, fever or headache.   aspirin 81 MG EC tablet Take 1 tablet (81 mg total) by mouth daily.   atorvastatin (LIPITOR) 40 MG tablet Take 1 tablet (40 mg total) by mouth daily.   B-D ULTRAFINE III SHORT PEN 31G X 8 MM MISC Inject into the skin daily.   ceFAZolin (ANCEF) IVPB Inject 2 g into the vein every 8 (eight) hours. Indication:  MSSA bacteremia First Dose: Yes Last Day of Therapy:  07/02/23 Labs - Once weekly:  CBC/D and BMP, Labs - Once weekly: ESR and CRP Method of administration: IV Push Pull PICC line at the completion of IV antibiotic therapy Method of administration may be changed at the discretion of home infusion pharmacist based upon assessment of the patient and/or caregiver's ability to self-administer the medication ordered.   gabapentin (NEURONTIN) 300 MG capsule Take 300 mg by mouth 3 (three) times daily.    insulin aspart (NOVOLOG) 100 UNIT/ML injection Inject 6-10 Units into the skin 2 (two) times daily. Per sliding scale   insulin aspart (NOVOLOG) 100 UNIT/ML injection Inject 10 Units into the skin 3 (three) times daily with meals.   insulin glargine (LANTUS) 100 unit/mL SOPN Inject 75 Units into the skin 2 (two) times daily.   metoprolol succinate (TOPROL-XL) 25 MG 24 hr tablet TAKE 1/2 TABLET (12.5 MG TOTAL) BY MOUTH DAILY.   omeprazole (PRILOSEC) 40 MG capsule  Take 40 mg by mouth in the morning and at bedtime.    oxyCODONE (OXY IR/ROXICODONE) 5 MG immediate release tablet Take 1 tablet (5 mg total) by mouth every 4 (four) hours as needed for moderate pain.   senna-docusate (SENOKOT-S) 8.6-50 MG tablet Take 1 tablet by mouth daily.   tiotropium (SPIRIVA) 18 MCG inhalation capsule Place 18 mcg into inhaler and inhale daily.   No facility-administered encounter medications on file as of 06/12/2023.     Patient Active Problem List   Diagnosis Date Noted   MSSA bacteremia 05/20/2023   Cellulitis and abscess of trunk 05/19/2023   Severe sepsis (HCC) 05/19/2023   AKI (acute kidney injury) (HCC) 05/19/2023   Chronic respiratory failure with hypoxia (HCC) 05/19/2023   Hyponatremia 05/19/2023   HTN (hypertension)    Dyspnea on exertion 12/10/2020   Unstable angina pectoris (HCC) 12/10/2020   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) 11/18/2020   Acute on chronic diastolic heart failure (HCC) 11/09/2020   Bilateral leg edema 10/05/2020   Wrist pain    Sciatica of right side    Right hip pain    Irreducible hernia of anterior abdominal wall    Hyperlipemia    High cholesterol    Candidal intertrigo    Anxiety    Blister of toe of right foot 12/02/2019   SIRS (systemic inflammatory response syndrome) (HCC) 12/02/2019   Diabetic  neuropathy (HCC) 12/02/2019   Generalized weakness 12/02/2019   History of CVA (cerebrovascular accident) 12/02/2019   History of peptic ulcer 12/02/2019   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   Marijuana smoker 12/02/2019   Obesity (BMI 30-39.9) 12/02/2019   Poorly controlled diabetes mellitus (HCC) 12/02/2019   Unstable angina (HCC) 12/02/2019   Syncope and collapse 12/02/2019   Chest pain 10/31/2019   Cellulitis of right foot 10/31/2019   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   Mild CAD 02/25/2015   Tobacco use disorder 02/24/2015   Uncontrolled type 2 diabetes mellitus with hyperglycemia (HCC)     Essential hypertension    Lumbar radiculopathy    Chronic pain    Peripheral neuropathy    Seizure disorder (HCC)    GERD (gastroesophageal reflux disease)    Hyperlipidemia    Chronic headaches      Health Maintenance Due  Topic Date Due   COVID-19 Vaccine (1) Never done   FOOT EXAM  Never done   OPHTHALMOLOGY EXAM  Never done   Diabetic kidney evaluation - Urine ACR  Never done   Hepatitis C Screening  Never done   DTaP/Tdap/Td (1 - Tdap) Never done   Colonoscopy  Never done   Lung Cancer Screening  Never done   Zoster Vaccines- Shingrix (1 of 2) Never done     Review of Systems  Physical Exam  BP (!) 170/90   Pulse 95   Resp 16   Ht 5' 7.9" (1.725 m)   Wt 242 lb 14.4 oz (110.2 kg)   SpO2 99%   BMI 37.04 kg/m  Physical Exam  Constitutional: He is oriented to person, place, and time. He appears well-developed and well-nourished. No distress.  HENT:  Mouth/Throat: Oropharynx is clear and moist. No oropharyngeal exudate.  Cardiovascular: Normal rate, regular rhythm and normal heart sounds. Exam reveals no gallop and no friction rub.  No murmur heard.  Pulmonary/Chest: Effort normal and breath sounds normal. No respiratory distress. He has no wheezes.  Abdominal: Soft. Bowel sounds are normal. He exhibits no distension. There is no tenderness.  Ext: left aka; right arm picc line is c.d.I Back = back abscess I x D. Healing  measuring 7 cm x 3 cm Neurological: He is alert and oriented to person, place, and time.  Skin: Skin is warm and dry. No rash noted. No erythema.  Psychiatric: He has a normal mood and affect. His behavior is normal.    CBC Lab Results  Component Value Date   WBC 11.0 (H) 05/25/2023   RBC 3.94 (L) 05/25/2023   HGB 10.9 (L) 05/25/2023   HCT 34.6 (L) 05/25/2023   PLT 285 05/25/2023   MCV 87.8 05/25/2023   MCH 27.7 05/25/2023   MCHC 31.5 05/25/2023   RDW 12.9 05/25/2023   LYMPHSABS 1.7 05/19/2023   MONOABS 1.1 (H) 05/19/2023   EOSABS  0.2 05/19/2023    BMET Lab Results  Component Value Date   NA 133 (L) 05/25/2023   K 4.5 05/25/2023   CL 100 05/25/2023   CO2 25 05/25/2023   GLUCOSE 148 (H) 05/25/2023   BUN 20 05/25/2023   CREATININE 1.35 (H) 05/25/2023   CALCIUM 8.5 (L) 05/25/2023   GFRNONAA 60 (L) 05/25/2023   GFRAA 125 12/28/2020   Assessment and Plan  -nausea = will give meds to help with symptoms. Continue with Iv cefazolin until 7/14  Hypertension = repeat BP in clinic is now within normal range  MSSA bacteremia = continue with cefazolin  through end date listed above.

## 2023-06-30 ENCOUNTER — Ambulatory Visit: Payer: Medicaid Other | Admitting: Internal Medicine

## 2023-06-30 ENCOUNTER — Telehealth: Payer: Self-pay

## 2023-06-30 NOTE — Telephone Encounter (Signed)
Patient missed his appt with Drue Second due to transportation. Earliest appt is with Tammy Sours on 07/05/23. Patient aware of appointment. He is on IV abx with an end date of 07/02/23. Per Tammy Sours ok to still remove picc after patient's end date of IV abx. Verbal orders given to Pam with [employer]. Preet Mangano T Pricilla Loveless

## 2023-07-05 ENCOUNTER — Ambulatory Visit: Payer: Medicaid Other | Admitting: Family

## 2024-01-01 ENCOUNTER — Other Ambulatory Visit: Payer: Self-pay

## 2024-01-01 ENCOUNTER — Telehealth: Payer: Self-pay

## 2024-01-01 DIAGNOSIS — Z87891 Personal history of nicotine dependence: Secondary | ICD-10-CM

## 2024-01-01 DIAGNOSIS — Z122 Encounter for screening for malignant neoplasm of respiratory organs: Secondary | ICD-10-CM

## 2024-01-01 NOTE — Telephone Encounter (Signed)
.  Lung Cancer Screening Narrative/Criteria Questionnaire (Cigarette Smokers Only- No Cigars/Pipes/vapes)   Samuel Bailey     SDMV:1/22/205 @ 10:00am with [name]  1961/03/18              LDCT: 01/12/2024 @ 10:30am at [facility]    63 y.o.   Phone: [phone]  Lung Screening Narrative   Before calling, confirm age (50-77 yrs Medicare / 50-80 yrs Private pay insurance)  Insurance information: Medicaid   I am calling at the request of Dr. Henrietta during hospital admission (referring provider) to schedule you for a lung screening.  Did your provider discuss this with you? No   This screening involves an initial meeting with our NP, who is the Nurse Navigator for the program.  It is called a shared decision making visit.  The initial meeting is required by insurance and Medicare to make sure you understand the program.  This appointment takes about 20 minutes to complete.  After you have spoken with the provider, we will schedule you for your screening scan.  This scan takes about 5-10 minutes to complete.  You can eat and drink normally before and after the scan.    I am going to ask you a few questions to make sure you meet the criteria to participate in the program.    Are you a current or former smoker? Former Age began smoking: 16   If you are a former smoker, what year did you quit smoking? 2018 }(must be within 15 yrs)    To calculate your smoking history, I need an accurate estimate of how many packs of cigarettes you smoked per day and   for how many years.    Years smoking 39 x Packs per day 2 = Pack years 41   (at least 20 pack yrs)   (Make sure they understand that we need to know how much they have smoked in the past, not just the number of PPD they are smoking now)  Do you have a personal history of cancer?  No    Do you have a family history of cancer? Yes  (cancer type and and relative) Mother-Colon   Are you having any of the following symptoms?  Coughing up  blood?  No  Weight loss of 15 lbs or more in the last 6 months without trying / you cannot explain  No  It looks like you meet all criteria.  When would be a good time for us  to schedule you for this screening?   Additional information:

## 2024-01-10 ENCOUNTER — Encounter: Payer: Medicaid Other | Admitting: Adult Health

## 2024-01-12 ENCOUNTER — Ambulatory Visit (HOSPITAL_BASED_OUTPATIENT_CLINIC_OR_DEPARTMENT_OTHER): Payer: Medicaid Other | Admitting: Radiology

## 2024-01-16 ENCOUNTER — Ambulatory Visit: Payer: Medicaid Other

## 2024-05-21 ENCOUNTER — Encounter (HOSPITAL_COMMUNITY): Payer: Self-pay

## 2024-05-21 ENCOUNTER — Inpatient Hospital Stay (HOSPITAL_COMMUNITY)
Admission: EM | Admit: 2024-05-21 | Discharge: 2024-05-30 | DRG: 637 | Disposition: A | Discharge status: Home / Self Care | Attending: Student | Admitting: Student

## 2024-05-21 ENCOUNTER — Emergency Department (HOSPITAL_COMMUNITY)

## 2024-05-21 ENCOUNTER — Other Ambulatory Visit: Payer: Self-pay

## 2024-05-21 DIAGNOSIS — Z8249 Family history of ischemic heart disease and other diseases of the circulatory system: Secondary | ICD-10-CM

## 2024-05-21 DIAGNOSIS — K529 Noninfective gastroenteritis and colitis, unspecified: Secondary | ICD-10-CM | POA: Diagnosis present

## 2024-05-21 DIAGNOSIS — Z6838 Body mass index (BMI) 38.0-38.9, adult: Secondary | ICD-10-CM

## 2024-05-21 DIAGNOSIS — E1122 Type 2 diabetes mellitus with diabetic chronic kidney disease: Secondary | ICD-10-CM | POA: Diagnosis present

## 2024-05-21 DIAGNOSIS — E66812 Obesity, class 2: Secondary | ICD-10-CM | POA: Diagnosis present

## 2024-05-21 DIAGNOSIS — R918 Other nonspecific abnormal finding of lung field: Secondary | ICD-10-CM | POA: Diagnosis present

## 2024-05-21 DIAGNOSIS — L03115 Cellulitis of right lower limb: Secondary | ICD-10-CM | POA: Diagnosis present

## 2024-05-21 DIAGNOSIS — E11621 Type 2 diabetes mellitus with foot ulcer: Secondary | ICD-10-CM | POA: Diagnosis present

## 2024-05-21 DIAGNOSIS — R079 Chest pain, unspecified: Secondary | ICD-10-CM

## 2024-05-21 DIAGNOSIS — Z825 Family history of asthma and other chronic lower respiratory diseases: Secondary | ICD-10-CM

## 2024-05-21 DIAGNOSIS — Z800 Family history of malignant neoplasm of digestive organs: Secondary | ICD-10-CM

## 2024-05-21 DIAGNOSIS — K219 Gastro-esophageal reflux disease without esophagitis: Secondary | ICD-10-CM | POA: Diagnosis present

## 2024-05-21 DIAGNOSIS — E559 Vitamin D deficiency, unspecified: Secondary | ICD-10-CM | POA: Diagnosis present

## 2024-05-21 DIAGNOSIS — N1831 Chronic kidney disease, stage 3a: Secondary | ICD-10-CM | POA: Diagnosis present

## 2024-05-21 DIAGNOSIS — R7989 Other specified abnormal findings of blood chemistry: Secondary | ICD-10-CM | POA: Diagnosis present

## 2024-05-21 DIAGNOSIS — E785 Hyperlipidemia, unspecified: Secondary | ICD-10-CM | POA: Diagnosis present

## 2024-05-21 DIAGNOSIS — Z8711 Personal history of peptic ulcer disease: Secondary | ICD-10-CM

## 2024-05-21 DIAGNOSIS — T508X5A Adverse effect of diagnostic agents, initial encounter: Secondary | ICD-10-CM | POA: Diagnosis not present

## 2024-05-21 DIAGNOSIS — G8929 Other chronic pain: Secondary | ICD-10-CM | POA: Diagnosis present

## 2024-05-21 DIAGNOSIS — Z87891 Personal history of nicotine dependence: Secondary | ICD-10-CM

## 2024-05-21 DIAGNOSIS — I5041 Acute combined systolic (congestive) and diastolic (congestive) heart failure: Secondary | ICD-10-CM | POA: Diagnosis not present

## 2024-05-21 DIAGNOSIS — N1411 Contrast-induced nephropathy: Secondary | ICD-10-CM | POA: Diagnosis not present

## 2024-05-21 DIAGNOSIS — Z1152 Encounter for screening for COVID-19: Secondary | ICD-10-CM

## 2024-05-21 DIAGNOSIS — Z79899 Other long term (current) drug therapy: Secondary | ICD-10-CM

## 2024-05-21 DIAGNOSIS — Z8042 Family history of malignant neoplasm of prostate: Secondary | ICD-10-CM

## 2024-05-21 DIAGNOSIS — G40909 Epilepsy, unspecified, not intractable, without status epilepticus: Secondary | ICD-10-CM | POA: Diagnosis present

## 2024-05-21 DIAGNOSIS — E1142 Type 2 diabetes mellitus with diabetic polyneuropathy: Secondary | ICD-10-CM | POA: Diagnosis present

## 2024-05-21 DIAGNOSIS — L97512 Non-pressure chronic ulcer of other part of right foot with fat layer exposed: Secondary | ICD-10-CM | POA: Diagnosis present

## 2024-05-21 DIAGNOSIS — I10 Essential (primary) hypertension: Secondary | ICD-10-CM | POA: Diagnosis present

## 2024-05-21 DIAGNOSIS — E11610 Type 2 diabetes mellitus with diabetic neuropathic arthropathy: Principal | ICD-10-CM

## 2024-05-21 DIAGNOSIS — I129 Hypertensive chronic kidney disease with stage 1 through stage 4 chronic kidney disease, or unspecified chronic kidney disease: Secondary | ICD-10-CM | POA: Diagnosis present

## 2024-05-21 DIAGNOSIS — Z888 Allergy status to other drugs, medicaments and biological substances status: Secondary | ICD-10-CM

## 2024-05-21 DIAGNOSIS — R519 Headache, unspecified: Secondary | ICD-10-CM

## 2024-05-21 DIAGNOSIS — Z8709 Personal history of other diseases of the respiratory system: Secondary | ICD-10-CM

## 2024-05-21 DIAGNOSIS — N179 Acute kidney failure, unspecified: Secondary | ICD-10-CM | POA: Diagnosis not present

## 2024-05-21 DIAGNOSIS — E7800 Pure hypercholesterolemia, unspecified: Secondary | ICD-10-CM | POA: Diagnosis present

## 2024-05-21 DIAGNOSIS — Z794 Long term (current) use of insulin: Secondary | ICD-10-CM

## 2024-05-21 DIAGNOSIS — Z823 Family history of stroke: Secondary | ICD-10-CM

## 2024-05-21 DIAGNOSIS — Z89612 Acquired absence of left leg above knee: Secondary | ICD-10-CM

## 2024-05-21 DIAGNOSIS — E538 Deficiency of other specified B group vitamins: Secondary | ICD-10-CM | POA: Diagnosis present

## 2024-05-21 DIAGNOSIS — E782 Mixed hyperlipidemia: Secondary | ICD-10-CM

## 2024-05-21 DIAGNOSIS — I7121 Aneurysm of the ascending aorta, without rupture: Secondary | ICD-10-CM

## 2024-05-21 DIAGNOSIS — E11628 Type 2 diabetes mellitus with other skin complications: Principal | ICD-10-CM | POA: Diagnosis present

## 2024-05-21 DIAGNOSIS — R0602 Shortness of breath: Secondary | ICD-10-CM

## 2024-05-21 DIAGNOSIS — B9561 Methicillin susceptible Staphylococcus aureus infection as the cause of diseases classified elsewhere: Secondary | ICD-10-CM | POA: Diagnosis present

## 2024-05-21 DIAGNOSIS — E1165 Type 2 diabetes mellitus with hyperglycemia: Secondary | ICD-10-CM | POA: Diagnosis present

## 2024-05-21 DIAGNOSIS — Z8673 Personal history of transient ischemic attack (TIA), and cerebral infarction without residual deficits: Secondary | ICD-10-CM

## 2024-05-21 DIAGNOSIS — Z833 Family history of diabetes mellitus: Secondary | ICD-10-CM

## 2024-05-21 DIAGNOSIS — I712 Thoracic aortic aneurysm, without rupture, unspecified: Secondary | ICD-10-CM | POA: Diagnosis present

## 2024-05-21 DIAGNOSIS — D631 Anemia in chronic kidney disease: Secondary | ICD-10-CM | POA: Diagnosis present

## 2024-05-21 DIAGNOSIS — L089 Local infection of the skin and subcutaneous tissue, unspecified: Secondary | ICD-10-CM | POA: Diagnosis present

## 2024-05-21 DIAGNOSIS — E119 Type 2 diabetes mellitus without complications: Secondary | ICD-10-CM

## 2024-05-21 DIAGNOSIS — Z7982 Long term (current) use of aspirin: Secondary | ICD-10-CM

## 2024-05-21 DIAGNOSIS — Z993 Dependence on wheelchair: Secondary | ICD-10-CM

## 2024-05-21 DIAGNOSIS — I2510 Atherosclerotic heart disease of native coronary artery without angina pectoris: Secondary | ICD-10-CM | POA: Diagnosis present

## 2024-05-21 LAB — BRAIN NATRIURETIC PEPTIDE: B Natriuretic Peptide: 100.3 pg/mL — ABNORMAL HIGH (ref 0.0–100.0)

## 2024-05-21 LAB — URINALYSIS, W/ REFLEX TO CULTURE (INFECTION SUSPECTED)
Bacteria, UA: NONE SEEN
Bilirubin Urine: NEGATIVE
Glucose, UA: >=500 mg/dL — AB
Ketones, ur: NEGATIVE mg/dL
Leukocytes,Ua: NEGATIVE
Nitrite: NEGATIVE
Protein, ur: >=300 mg/dL — AB
Specific Gravity, Urine: 1.01 (ref 1.005–1.030)
pH: 6 (ref 5.0–8.0)

## 2024-05-21 LAB — CBC
HCT: 38.9 % — ABNORMAL LOW (ref 39.0–52.0)
Hemoglobin: 12.6 g/dL — ABNORMAL LOW (ref 13.0–17.0)
MCH: 28.3 pg (ref 26.0–34.0)
MCHC: 32.4 g/dL (ref 30.0–36.0)
MCV: 87.4 fL (ref 80.0–100.0)
Platelets: 235 10*3/uL (ref 150–400)
RBC: 4.45 MIL/uL (ref 4.22–5.81)
RDW: 13.2 % (ref 11.5–15.5)
WBC: 7.6 10*3/uL (ref 4.0–10.5)
nRBC: 0 % (ref 0.0–0.2)

## 2024-05-21 LAB — BASIC METABOLIC PANEL WITH GFR
Anion gap: 10 (ref 5–15)
BUN: 22 mg/dL (ref 8–23)
CO2: 21 mmol/L — ABNORMAL LOW (ref 22–32)
Calcium: 8.6 mg/dL — ABNORMAL LOW (ref 8.9–10.3)
Chloride: 106 mmol/L (ref 98–111)
Creatinine, Ser: 1.56 mg/dL — ABNORMAL HIGH (ref 0.61–1.24)
GFR, Estimated: 50 mL/min — ABNORMAL LOW (ref >60)
Glucose, Bld: 240 mg/dL — ABNORMAL HIGH (ref 70–99)
Potassium: 4.6 mmol/L (ref 3.5–5.1)
Sodium: 137 mmol/L (ref 135–145)

## 2024-05-21 LAB — RESP PANEL BY RT-PCR (RSV, FLU A&B, COVID)  RVPGX2
Influenza A by PCR: NEGATIVE
Influenza B by PCR: NEGATIVE
Resp Syncytial Virus by PCR: NEGATIVE
SARS Coronavirus 2 by RT PCR: NEGATIVE

## 2024-05-21 LAB — TROPONIN I (HIGH SENSITIVITY)
Troponin I (High Sensitivity): 21 ng/L — ABNORMAL HIGH (ref <18)
Troponin I (High Sensitivity): 19 ng/L — ABNORMAL HIGH (ref <18)

## 2024-05-21 LAB — GLUCOSE, CAPILLARY: Glucose-Capillary: 289 mg/dL — ABNORMAL HIGH (ref 70–99)

## 2024-05-21 LAB — HEMOGLOBIN A1C
Hgb A1c MFr Bld: 12.6 % — ABNORMAL HIGH (ref 4.8–5.6)
Mean Plasma Glucose: 314.92 mg/dL

## 2024-05-21 LAB — PROCALCITONIN: Procalcitonin: <0.1 ng/mL

## 2024-05-21 LAB — MAGNESIUM: Magnesium: 1.7 mg/dL (ref 1.7–2.4)

## 2024-05-21 MED ORDER — METRONIDAZOLE 500 MG/100ML IV SOLN
500.0000 mg | Freq: Two times a day (BID) | INTRAVENOUS | Status: DC
  Administered 2024-05-21 – 2024-05-22 (×3): 500 mg via INTRAVENOUS
  Filled 2024-05-21 (×3): qty 100

## 2024-05-21 MED ORDER — ATORVASTATIN CALCIUM 40 MG PO TABS
40.0000 mg | ORAL_TABLET | Freq: Every day | ORAL | Status: DC
  Administered 2024-05-21 – 2024-05-30 (×10): 40 mg via ORAL
  Filled 2024-05-21 (×10): qty 1

## 2024-05-21 MED ORDER — INSULIN GLARGINE 100 UNITS/ML SOLOSTAR PEN: 25.0000 [IU] | PEN_INJECTOR | Freq: Two times a day (BID) | SUBCUTANEOUS | Status: DC

## 2024-05-21 MED ORDER — IOHEXOL 350 MG/ML SOLN
75.0000 mL | Freq: Once | INTRAVENOUS | Status: AC | PRN
  Administered 2024-05-21: 75 mL via INTRAVENOUS

## 2024-05-21 MED ORDER — NALOXONE HCL 0.4 MG/ML IJ SOLN: 0.4000 mg | INTRAMUSCULAR | Status: DC | PRN

## 2024-05-21 MED ORDER — MELATONIN 3 MG PO TABS: 3.0000 mg | ORAL_TABLET | Freq: Every evening | ORAL | Status: DC | PRN

## 2024-05-21 MED ORDER — ACETAMINOPHEN 325 MG PO TABS: 650.0000 mg | ORAL_TABLET | Freq: Four times a day (QID) | ORAL | Status: DC | PRN

## 2024-05-21 MED ORDER — SODIUM CHLORIDE 0.9 % IV SOLN
1.0000 g | Freq: Once | INTRAVENOUS | Status: AC
  Administered 2024-05-21: 1 g via INTRAVENOUS
  Filled 2024-05-21: qty 10

## 2024-05-21 MED ORDER — FENTANYL CITRATE PF 50 MCG/ML IJ SOSY
12.5000 ug | PREFILLED_SYRINGE | INTRAMUSCULAR | Status: DC | PRN
  Administered 2024-05-23 – 2024-05-29 (×9): 12.5 ug via INTRAVENOUS
  Filled 2024-05-21 (×9): qty 1

## 2024-05-21 MED ORDER — INSULIN GLARGINE-YFGN 100 UNIT/ML ~~LOC~~ SOLN
25.0000 [IU] | Freq: Two times a day (BID) | SUBCUTANEOUS | Status: DC
  Administered 2024-05-21 – 2024-05-22 (×3): 25 [IU] via SUBCUTANEOUS
  Filled 2024-05-21 (×6): qty 0.25

## 2024-05-21 MED ORDER — DIPHENHYDRAMINE HCL 50 MG/ML IJ SOLN
25.0000 mg | Freq: Once | INTRAMUSCULAR | Status: AC
  Administered 2024-05-21: 25 mg via INTRAVENOUS
  Filled 2024-05-21: qty 1

## 2024-05-21 MED ORDER — TIOTROPIUM BROMIDE MONOHYDRATE 18 MCG IN CAPS: 18.0000 ug | ORAL_CAPSULE | Freq: Every day | RESPIRATORY_TRACT | Status: DC

## 2024-05-21 MED ORDER — ONDANSETRON HCL 4 MG/2ML IJ SOLN
4.0000 mg | Freq: Four times a day (QID) | INTRAMUSCULAR | Status: DC | PRN
  Administered 2024-05-28: 4 mg via INTRAVENOUS
  Filled 2024-05-21: qty 2

## 2024-05-21 MED ORDER — ASPIRIN 81 MG PO TBEC
81.0000 mg | DELAYED_RELEASE_TABLET | Freq: Every day | ORAL | Status: DC
  Administered 2024-05-22 – 2024-05-30 (×8): 81 mg via ORAL
  Filled 2024-05-21 (×9): qty 1

## 2024-05-21 MED ORDER — INSULIN ASPART 100 UNIT/ML IJ SOLN
0.0000 [IU] | Freq: Three times a day (TID) | INTRAMUSCULAR | Status: DC
  Administered 2024-05-22 (×2): 5 [IU] via SUBCUTANEOUS
  Administered 2024-05-22: 3 [IU] via SUBCUTANEOUS
  Administered 2024-05-23: 8 [IU] via SUBCUTANEOUS
  Administered 2024-05-23: 5 [IU] via SUBCUTANEOUS
  Administered 2024-05-23: 15 [IU] via SUBCUTANEOUS
  Administered 2024-05-24: 11 [IU] via SUBCUTANEOUS
  Administered 2024-05-24: 8 [IU] via SUBCUTANEOUS
  Administered 2024-05-24: 3 [IU] via SUBCUTANEOUS
  Administered 2024-05-25: 11 [IU] via SUBCUTANEOUS
  Administered 2024-05-25 (×2): 8 [IU] via SUBCUTANEOUS
  Administered 2024-05-26 – 2024-05-27 (×5): 5 [IU] via SUBCUTANEOUS
  Administered 2024-05-27: 11 [IU] via SUBCUTANEOUS
  Administered 2024-05-28: 3 [IU] via SUBCUTANEOUS
  Administered 2024-05-28: 8 [IU] via SUBCUTANEOUS
  Administered 2024-05-28 – 2024-05-29 (×2): 3 [IU] via SUBCUTANEOUS
  Administered 2024-05-29: 8 [IU] via SUBCUTANEOUS
  Administered 2024-05-29: 5 [IU] via SUBCUTANEOUS
  Administered 2024-05-30: 3 [IU] via SUBCUTANEOUS
  Administered 2024-05-30: 11 [IU] via SUBCUTANEOUS

## 2024-05-21 MED ORDER — METOPROLOL SUCCINATE ER 25 MG PO TB24
12.5000 mg | ORAL_TABLET | Freq: Every day | ORAL | Status: DC
  Administered 2024-05-22 – 2024-05-30 (×9): 12.5 mg via ORAL
  Filled 2024-05-21 (×9): qty 1

## 2024-05-21 MED ORDER — SODIUM CHLORIDE 0.9 % IV SOLN
1.0000 g | INTRAVENOUS | Status: DC
  Administered 2024-05-22: 1 g via INTRAVENOUS
  Filled 2024-05-21: qty 10

## 2024-05-21 MED ORDER — ACETAMINOPHEN 650 MG RE SUPP
650.0000 mg | Freq: Four times a day (QID) | RECTAL | Status: DC | PRN
Start: 2024-05-21 — End: 2024-05-30

## 2024-05-21 MED ORDER — PROCHLORPERAZINE EDISYLATE 10 MG/2ML IJ SOLN
10.0000 mg | Freq: Once | INTRAMUSCULAR | Status: AC
  Administered 2024-05-21: 10 mg via INTRAVENOUS
  Filled 2024-05-21: qty 2

## 2024-05-21 MED ORDER — VANCOMYCIN HCL 1750 MG/350ML IV SOLN
1750.0000 mg | Freq: Once | INTRAVENOUS | Status: AC
  Administered 2024-05-21: 1750 mg via INTRAVENOUS
  Filled 2024-05-21: qty 350

## 2024-05-21 MED ORDER — ALBUTEROL SULFATE (2.5 MG/3ML) 0.083% IN NEBU: 2.5000 mg | INHALATION_SOLUTION | RESPIRATORY_TRACT | Status: DC | PRN

## 2024-05-21 MED ORDER — PANTOPRAZOLE SODIUM 40 MG PO TBEC
40.0000 mg | DELAYED_RELEASE_TABLET | Freq: Two times a day (BID) | ORAL | Status: DC
  Administered 2024-05-21 – 2024-05-30 (×18): 40 mg via ORAL
  Filled 2024-05-21 (×19): qty 1

## 2024-05-21 MED ORDER — INSULIN ASPART 100 UNIT/ML IJ SOLN
0.0000 [IU] | Freq: Every day | INTRAMUSCULAR | Status: DC
  Administered 2024-05-21 – 2024-05-22 (×2): 3 [IU] via SUBCUTANEOUS
  Administered 2024-05-23: 2 [IU] via SUBCUTANEOUS
  Administered 2024-05-25: 3 [IU] via SUBCUTANEOUS
  Administered 2024-05-26: 2 [IU] via SUBCUTANEOUS
  Administered 2024-05-27: 4 [IU] via SUBCUTANEOUS
  Administered 2024-05-28: 2 [IU] via SUBCUTANEOUS
  Administered 2024-05-29: 3 [IU] via SUBCUTANEOUS

## 2024-05-21 MED ORDER — GABAPENTIN 300 MG PO CAPS
300.0000 mg | ORAL_CAPSULE | Freq: Three times a day (TID) | ORAL | Status: DC
  Administered 2024-05-21 – 2024-05-27 (×16): 300 mg via ORAL
  Filled 2024-05-21 (×17): qty 1

## 2024-05-21 MED ORDER — VANCOMYCIN HCL 1.25 G IV SOLR
1250.0000 mg | INTRAVENOUS | Status: DC
  Administered 2024-05-22: 1250 mg via INTRAVENOUS
  Filled 2024-05-21: qty 25

## 2024-05-21 MED ORDER — UMECLIDINIUM BROMIDE 62.5 MCG/ACT IN AEPB
1.0000 | INHALATION_SPRAY | Freq: Every day | RESPIRATORY_TRACT | Status: DC
  Administered 2024-05-21 – 2024-05-30 (×10): 1 via RESPIRATORY_TRACT
  Filled 2024-05-21 (×2): qty 7

## 2024-05-21 NOTE — ED Provider Notes (Signed)
 [facility] AT [facility] Provider Note   CSN: [id number] Arrival date & time: 05/21/24  1159     History  Chief Complaint  Patient presents with   Shortness of Breath        pain    Multiple pain sites.     Samuel Bailey is a 63 y.o. male with PMHx CAD, DM, diabetic neuropathy, CVA, COPD, polysubstance abuse, headaches, chronic pain, GERD, HLD, HTN, seizures, who presents to ED with multiple complaints.  Patient endorses chest pain, SOB, dizziness/lightheadedness. Chest pain and SOB x2 days. Lightheadedness has been present x1 week and intermittent. Chest pain is intermittent. Chest pain is not associated with any specific rest vs exertion vs positioning. States that sometimes it hurts worse with deep inspiration and with eating. Also stating that it hurts with pressing on the chest. Also endorsing orthopnea.  Also with wound on bottom of right foot x1 week. States that it has probably been there longer but only noticing it recently. Patient stating that he always wears shoes so he doubts any trauma to the foot.   Denies fever. Endorses one episode of vomiting every morning and chronic diarrhea that usually happens once every other day. Denies abdominal pain today.    Shortness of Breath      Home Medications Prior to Admission medications   Medication Sig Start Date End Date Taking? Authorizing Provider  acetaminophen  (TYLENOL ) 325 MG tablet Take 2 tablets (650 mg total) by mouth every 6 (six) hours as needed for mild pain, fever or headache. 05/25/23   Abbe Abate, MD  aspirin  81 MG EC tablet Take 1 tablet (81 mg total) by mouth daily. 03/29/21   Tobb, Kardie, DO  atorvastatin  (LIPITOR) 40 MG tablet Take 1 tablet (40 mg total) by mouth daily. 03/29/21   Tobb, Kardie, DO  B-D ULTRAFINE III SHORT PEN 31G X 8 MM MISC Inject into the skin daily. 05/31/21   [provider]  gabapentin  (NEURONTIN ) 300 MG capsule Take 300 mg by mouth 3  (three) times daily.     [provider]  insulin  aspart (NOVOLOG ) 100 UNIT/ML injection Inject 6-10 Units into the skin 2 (two) times daily. Per sliding scale    [provider]  insulin  aspart (NOVOLOG ) 100 UNIT/ML injection Inject 10 Units into the skin 3 (three) times daily with meals. 05/25/23   Abbe Abate, MD  insulin  glargine (LANTUS ) 100 unit/mL SOPN Inject 75 Units into the skin 2 (two) times daily. 05/25/23   Abbe Abate, MD  metoprolol  succinate (TOPROL -XL) 25 MG 24 hr tablet TAKE 1/2 TABLET (12.5 MG TOTAL) BY MOUTH DAILY. 05/25/23 05/24/24  Abbe Abate, MD  omeprazole (PRILOSEC) 40 MG capsule Take 40 mg by mouth in the morning and at bedtime.     [provider]  ondansetron  (ZOFRAN ) 4 MG tablet Take 1 tablet (4 mg total) by mouth every 8 (eight) hours as needed for nausea or vomiting. 06/12/23   Liane Redman, MD  oxyCODONE  (OXY IR/ROXICODONE ) 5 MG immediate release tablet Take 1 tablet (5 mg total) by mouth every 4 (four) hours as needed for moderate pain. Patient not taking: Reported on 06/12/2023 05/25/23   Abbe Abate, MD  senna-docusate (SENOKOT-S) 8.6-50 MG tablet Take 1 tablet by mouth daily. 05/26/23   Abbe Abate, MD  tiotropium (SPIRIVA ) 18 MCG inhalation capsule Place 18 mcg into inhaler and inhale daily.    [provider]  Allergies    Lovastatin    Review of Systems   Review of Systems  Respiratory:  Positive for shortness of breath.     Physical Exam Updated Vital Signs BP (!) 156/83   Pulse 93   Temp 98 F (36.7 C)   Resp 13   Ht 5' 7.9" (1.725 m)   Wt 111.1 kg   SpO2 100%   BMI 37.36 kg/m  Physical Exam Vitals and nursing note reviewed.  Constitutional:      General: He is not in acute distress.    Appearance: He is not ill-appearing or toxic-appearing.  HENT:     Head: Normocephalic and atraumatic.     Mouth/Throat:     Mouth: Mucous membranes are moist.     Pharynx: No posterior  oropharyngeal erythema.  Eyes:     General: No scleral icterus.       Right eye: No discharge.        Left eye: No discharge.     Conjunctiva/sclera: Conjunctivae normal.  Cardiovascular:     Rate and Rhythm: Normal rate and regular rhythm.     Pulses: Normal pulses.     Heart sounds: No murmur heard. Pulmonary:     Effort: Pulmonary effort is normal. No respiratory distress.     Breath sounds: Normal breath sounds. No wheezing, rhonchi or rales.     Comments: Chest pain is reproducible to palpation.  Abdominal:     Tenderness: There is no abdominal tenderness.  Musculoskeletal:     Comments: Left AKA. Right leg with +2 pitting edema. 1.5cm ulceration on bottom of right foot. No purulence or surrounding erythema.   Skin:    General: Skin is warm and dry.     Findings: No rash.  Neurological:     General: No focal deficit present.     Mental Status: He is alert and oriented to person, place, and time. Mental status is at baseline.  Psychiatric:        Mood and Affect: Mood normal.        Behavior: Behavior normal.     ED Results / Procedures / Treatments   Labs (all labs ordered are listed, but only abnormal results are displayed) Labs Reviewed - No data to display  EKG None  Radiology No results found.  Procedures Procedures    Medications Ordered in ED Medications - No data to display  ED Course/ Medical Decision Making/ A&P                                 Medical Decision Making Amount and/or Complexity of Data Reviewed Labs: ordered. Radiology: ordered.   This patient presents to the ED for concern of chest pain, this involves an extensive number of treatment options, and is a complaint that carries with it a high risk of complications and morbidity.  The differential diagnosis includes acute coronary syndrome, congestive heart failure, pericarditis, pneumonia, pulmonary embolism, tension pneumothorax, esophageal rupture, aortic dissection, cardiac  tamponade, musculoskeletal   Co morbidities that complicate the patient evaluation  CAD, DM, diabetic neuropathy, CVA, COPD, polysubstance abuse, headaches, chronic pain, GERD, HLD, HTN, seizures   Additional history obtained:  Dr. Daina Drum PCP 05/2023 ECHO: 50% EF   Problem List / ED Course / Critical interventions / Medication management  Patient presented for multiple complaints.  Patient concern for SOB/orthopnea and central chest pain x 2 days.  Also with intermittent lightheadedness.  Also endorses a wound on bottom of his right foot that is painful. Physical exam with a 1.5 cm ulceration on the bottom of the right foot.  There is also +2 pitting edema to the right leg - left leg s/p AKA.  Patient's chest pain is also reproducible to palpation.  Rest of physical exam reassuring.  Patient afebrile with stable vitals.  Breathing appropriately on room air. I Ordered, and personally interpreted labs.  Initial troponin slightly elevated at 21 - repeat troponin pending.  CBC without leukocytosis.  There is mild anemia with hemoglobin of 12.6.  BMP with elevated creatinine near patient's baseline currently at 1.56 today.  BNP very slightly elevated at 100.3.  UA pending.   The patient was maintained on a cardiac monitor.  I personally viewed and interpreted the EKG/cardiac monitored which showed an underlying rhythm of: Sinus rhythm. I ordered imaging studies including chest xray/foot xray to assess for process contributing to patient's symptoms.  Chest x-ray without acute process.  Foot x-ray pending.  Will move forward with CTA chest. I have reviewed the patients home medicines and have made adjustments as needed  Social Determinants of Health:  none  3PM Care of LANNIS LICHTENWALNER  transferred to [employer] at the end of my shift as the patient will require reassessment once labs/imaging have resulted. Patient presentation, ED course, and plan of care discussed with review of all pertinent labs  and imaging. Please see his/her note for further details regarding further ED course and disposition. Plan at time of handoff is reassess patient after imaging, UA, and repeat troponin. If foot xray is reassuring, patient can follow with wound care clinic. This may be altered or completely changed at the discretion of the oncoming team pending results of further workup.         Final Clinical Impression(s) / ED Diagnoses Final diagnoses:  None    Rx / DC Orders ED Discharge Orders     None         Don Fritter 05/21/24 1451    Mozell Arias, MD 05/21/24 1547

## 2024-05-21 NOTE — ED Notes (Signed)
Patient taken to the restroom in a wheelchair.

## 2024-05-21 NOTE — ED Notes (Signed)
 Patient transported to X-ray

## 2024-05-21 NOTE — ED Notes (Signed)
 Lab called for a troponin add on

## 2024-05-21 NOTE — Progress Notes (Signed)
 Pharmacy Antibiotic Note  Samuel Bailey is a 63 y.o. male admitted on 05/21/2024 presenting with DFI.  Pharmacy has been consulted for vancomycin  dosing.  Plan: Vancomycin  1750 mg IV x 1, then 1250 mg IV q 24h (eAUC 513) Monitor renal function, Cx and clinical progression to narrow Vancomycin  levels as indicated  Height: 5' 7.9" (172.5 cm) Weight: 111.1 kg (245 lb) IBW/kg (Calculated) : 68.17  Temp (24hrs), Avg:98.2 F (36.8 C), Min:98 F (36.7 C), Max:98.3 F (36.8 C)  Recent Labs  Lab 05/21/24 1226  WBC 7.6  CREATININE 1.56*    Estimated Creatinine Clearance: 59.3 mL/min (A) (by C-G formula based on SCr of 1.56 mg/dL (H)).    Allergies  Allergen Reactions   Lovastatin     diarrhea    Trinidad Funk, PharmD, [facility] Clinical Pharmacist ED Pharmacist Phone # [phone] 05/21/2024 8:07 PM

## 2024-05-21 NOTE — ED Provider Notes (Signed)
 Received patient in signout from previous provider pending CTA.  See her note.  In short, patient presents to the Emergency Department for evaluation of multiple complaints most notably chest pain, shortness of breath over the past week.    As well as right ulceration of foot and swelling over the past 1-2 weeks.  He is unsure as he has neuropathy in that foot and is unable to feel if he sustained an injury.  Has been followed by [facility] in past for Charcot foot of both extremities however had amputation in past due to diabetic infection.  Also complains of headache and dizziness that is worsened with position.  Provided migraine cocktail with significant improvement. Has history of migraines and reports this is typically how they present. No recent falls, thinners. Neurologically intact with low suspicion for CVA/TIA.  ED workup notable for downtrending troponins from 21-19.  EKG for NSR with no ST nor T wave abnormalities.  BNP 100.  Hemoglobin 12.6.  CBG 240.  Creatinine 1.56.  X-ray of right foot shows progression of Charcot arthropathy since previous x-ray.  Provided Rocephin  for possible diabetic infection, cellulitis as he has swelling in his RLE.  Attempted to obtain ultrasound of RLE to rule out DVT however vascular went home at 5 so was unable to obtain this.  I think this would be beneficial to obtain during admission.   As for chest pain, shortness of breath chest x-ray does not show any pneumonia, fluid.  CT PE without acute abnormality, PE.  Chest pain is reproducible with palpation and likely secondary to MSK. Significantly improved since in ED  Physical Exam  BP (!) 144/87   Pulse 91   Temp 98 F (36.7 C)   Resp (!) 22   Ht 5' 7.9" (1.725 m)   Wt 111.1 kg   SpO2 100%   BMI 37.36 kg/m   Physical Exam Vitals and nursing note reviewed.  Constitutional:      General: He is not in acute distress.    Appearance: Normal appearance.  HENT:     Head: Normocephalic and  atraumatic.  Eyes:     Conjunctiva/sclera: Conjunctivae normal.  Cardiovascular:     Rate and Rhythm: Normal rate.  Pulmonary:     Effort: Pulmonary effort is normal. No tachypnea or respiratory distress.  Chest:     Chest wall: Tenderness present.  Musculoskeletal:     Right lower leg: Tenderness present. Edema present.     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  Skin:    Coloration: Skin is not jaundiced or pale.  Neurological:     Mental Status: He is alert. Mental status is at baseline.     ED Course / MDM    Medical Decision Making Amount and/or Complexity of Data Reviewed Labs: ordered. Radiology: ordered.  Risk Prescription drug management. Decision regarding hospitalization.   Following ED workup, I feel that patient would benefit from admission for IV antibiotics, possible MRI of foot if warranted to ensure no osteo.  He has significant history with diabetes and has amputation from previous Charcot foot.  I also think that he would benefit from obtaining a ultrasound to rule out DVT in RLE as it is swollen and is unable to be compared to LLE as it is amputated. Is warm to touch with 2+ pitting edema so unsure whether fluid vs cellulitis vs DVT.   Royann Cords, PA 05/21/24 2052    Carin Charleston, MD 05/22/24 [phone]

## 2024-05-21 NOTE — ED Triage Notes (Signed)
 Patient bib EMS from home with complaints of shortness of breath, chest pain, dizziness, jaw pain, bilateral shoulder pain, pain in the right food where there is a wound that radiates up his leg.  Patient also states his face is more swollen today and his hands are numb. Shortness of breath and chest pain have been going on for 2 days, dizziness and light headed for a week.   PMH: MI with 2 stents, diabetic, hypetension, COPD, CHF

## 2024-05-21 NOTE — Consult Note (Addendum)
 WOC Nurse Consult Note: patient with known charcot foot deformity; last notes found from podiatry in 2022  Reason for Consult: R diabetic foot ulcer  Wound type: full thickness plantar R foot r/t neuropathy and charcot foot deformity  Pressure Injury POA: NA, not pressure related  Measurement: see nursing flowsheet  Wound bed: 100% pink clean  Drainage (amount, consistency, odor) no purulent per MD note  Periwound: heavily callused  Dressing procedure/placement/frequency: Cleanse R plantar foot wound with NS, cut a strip of silver hydrofiber (Aquacel Lorain Robson [id number]) and using a Q tip applicator apply to wound bed, cover with silicone foam or dry gauze and Kerlix roll gauze whichever is preferred.   Patient would benefit from referral back to podiatry for ongoing management of this wound and foot deformity.   POC discussed with bedside nurse. WOC team will not follow. Re-consult if further needs arise.   Thank you,    Ronni Colace MSN, RN-BC, [employer] [phone]

## 2024-05-21 NOTE — ED Notes (Signed)
 Patient transported to CT

## 2024-05-21 NOTE — H&P (Signed)
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 the following: Scheduled Spiriva .   Plan: cont outpatient Spiriva . Prn albuterol  nebulizer. Check CMP and serum magnesium and phosphorus level in the AM.                       #) Hyperlipidemia: documented h/o such. On atorvastatin  as outpatient.   Plan: continue home statin.                      #) Essential Hypertension: documented h/o such, with outpatient antihypertensive regimen including metoprolol  succinate 12.5 mg p.o. daily.  SBP's in  the ED today: 1 teens 150s, with corresponding heart rates in the 80s to 90s mmHg.   Plan: Close monitoring of subsequent BP via routine VS. resume home beta-blocker.                      #) CKD Stage 3A: Documented history of such, with baseline creatinine 1.3-1.6, with presenting creatinine consistent with this baseline.    Plan: Monitor strict I's and O's and daily weights.  Attempt to avoid nephrotoxic agents.   CMP/magnesium level in the AM.       DVT prophylaxis: SCD's   Code Status: Full code Family Communication: none Disposition Plan: Per Rounding Team Consults called: none;  Admission status: Observation     I SPENT GREATER THAN 75  MINUTES IN CLINICAL CARE TIME/MEDICAL DECISION-MAKING IN COMPLETING THIS ADMISSION.      Zuriah Bordas B Everley Evora DO [facility]  From 7PM - 7AM   05/21/2024, 7:53 PM

## 2024-05-22 ENCOUNTER — Observation Stay (HOSPITAL_COMMUNITY)

## 2024-05-22 DIAGNOSIS — E7800 Pure hypercholesterolemia, unspecified: Secondary | ICD-10-CM | POA: Diagnosis present

## 2024-05-22 DIAGNOSIS — R7989 Other specified abnormal findings of blood chemistry: Secondary | ICD-10-CM | POA: Diagnosis present

## 2024-05-22 DIAGNOSIS — I712 Thoracic aortic aneurysm, without rupture, unspecified: Secondary | ICD-10-CM | POA: Diagnosis present

## 2024-05-22 DIAGNOSIS — D631 Anemia in chronic kidney disease: Secondary | ICD-10-CM | POA: Diagnosis present

## 2024-05-22 DIAGNOSIS — L089 Local infection of the skin and subcutaneous tissue, unspecified: Secondary | ICD-10-CM | POA: Diagnosis present

## 2024-05-22 DIAGNOSIS — E1165 Type 2 diabetes mellitus with hyperglycemia: Secondary | ICD-10-CM | POA: Diagnosis present

## 2024-05-22 DIAGNOSIS — K529 Noninfective gastroenteritis and colitis, unspecified: Secondary | ICD-10-CM | POA: Diagnosis present

## 2024-05-22 DIAGNOSIS — B9561 Methicillin susceptible Staphylococcus aureus infection as the cause of diseases classified elsewhere: Secondary | ICD-10-CM | POA: Diagnosis present

## 2024-05-22 DIAGNOSIS — E1122 Type 2 diabetes mellitus with diabetic chronic kidney disease: Secondary | ICD-10-CM | POA: Diagnosis present

## 2024-05-22 DIAGNOSIS — E11628 Type 2 diabetes mellitus with other skin complications: Secondary | ICD-10-CM | POA: Diagnosis present

## 2024-05-22 DIAGNOSIS — Z89612 Acquired absence of left leg above knee: Secondary | ICD-10-CM | POA: Diagnosis not present

## 2024-05-22 DIAGNOSIS — E11621 Type 2 diabetes mellitus with foot ulcer: Secondary | ICD-10-CM | POA: Diagnosis present

## 2024-05-22 DIAGNOSIS — N1831 Chronic kidney disease, stage 3a: Secondary | ICD-10-CM | POA: Diagnosis present

## 2024-05-22 DIAGNOSIS — L97512 Non-pressure chronic ulcer of other part of right foot with fat layer exposed: Secondary | ICD-10-CM | POA: Diagnosis present

## 2024-05-22 DIAGNOSIS — R0602 Shortness of breath: Secondary | ICD-10-CM | POA: Diagnosis present

## 2024-05-22 DIAGNOSIS — I5041 Acute combined systolic (congestive) and diastolic (congestive) heart failure: Secondary | ICD-10-CM | POA: Diagnosis not present

## 2024-05-22 DIAGNOSIS — Z6838 Body mass index (BMI) 38.0-38.9, adult: Secondary | ICD-10-CM | POA: Diagnosis not present

## 2024-05-22 DIAGNOSIS — E1142 Type 2 diabetes mellitus with diabetic polyneuropathy: Secondary | ICD-10-CM | POA: Diagnosis present

## 2024-05-22 DIAGNOSIS — G40909 Epilepsy, unspecified, not intractable, without status epilepticus: Secondary | ICD-10-CM | POA: Diagnosis present

## 2024-05-22 DIAGNOSIS — Z1152 Encounter for screening for COVID-19: Secondary | ICD-10-CM | POA: Diagnosis not present

## 2024-05-22 DIAGNOSIS — G8929 Other chronic pain: Secondary | ICD-10-CM | POA: Diagnosis present

## 2024-05-22 DIAGNOSIS — L03115 Cellulitis of right lower limb: Secondary | ICD-10-CM | POA: Diagnosis present

## 2024-05-22 DIAGNOSIS — I10 Essential (primary) hypertension: Secondary | ICD-10-CM

## 2024-05-22 DIAGNOSIS — M7989 Other specified soft tissue disorders: Secondary | ICD-10-CM

## 2024-05-22 DIAGNOSIS — I2510 Atherosclerotic heart disease of native coronary artery without angina pectoris: Secondary | ICD-10-CM | POA: Diagnosis present

## 2024-05-22 DIAGNOSIS — Z794 Long term (current) use of insulin: Secondary | ICD-10-CM | POA: Diagnosis not present

## 2024-05-22 DIAGNOSIS — I129 Hypertensive chronic kidney disease with stage 1 through stage 4 chronic kidney disease, or unspecified chronic kidney disease: Secondary | ICD-10-CM | POA: Diagnosis present

## 2024-05-22 DIAGNOSIS — N179 Acute kidney failure, unspecified: Secondary | ICD-10-CM | POA: Diagnosis not present

## 2024-05-22 LAB — BRAIN NATRIURETIC PEPTIDE: B Natriuretic Peptide: 93.6 pg/mL (ref 0.0–100.0)

## 2024-05-22 LAB — COMPREHENSIVE METABOLIC PANEL WITH GFR
ALT: 10 U/L (ref 0–44)
AST: 8 U/L — ABNORMAL LOW (ref 15–41)
Albumin: 2.1 g/dL — ABNORMAL LOW (ref 3.5–5.0)
Alkaline Phosphatase: 60 U/L (ref 38–126)
Anion gap: 8 (ref 5–15)
BUN: 22 mg/dL (ref 8–23)
CO2: 22 mmol/L (ref 22–32)
Calcium: 8.5 mg/dL — ABNORMAL LOW (ref 8.9–10.3)
Chloride: 105 mmol/L (ref 98–111)
Creatinine, Ser: 1.68 mg/dL — ABNORMAL HIGH (ref 0.61–1.24)
GFR, Estimated: 46 mL/min — ABNORMAL LOW (ref >60)
Glucose, Bld: 199 mg/dL — ABNORMAL HIGH (ref 70–99)
Potassium: 3.9 mmol/L (ref 3.5–5.1)
Sodium: 135 mmol/L (ref 135–145)
Total Bilirubin: 0.4 mg/dL (ref 0.0–1.2)
Total Protein: 5.4 g/dL — ABNORMAL LOW (ref 6.5–8.1)

## 2024-05-22 LAB — ECHOCARDIOGRAM COMPLETE
Area-P 1/2: 4.99 cm2
Calc EF: 53.3 %
Height: 67.9 in
S' Lateral: 4.8 cm
Single Plane A2C EF: 44.7 %
Single Plane A4C EF: 60.9 %
Weight: 3931.24 [oz_av]

## 2024-05-22 LAB — CBC WITH DIFFERENTIAL/PLATELET
Abs Immature Granulocytes: 0.07 10*3/uL (ref 0.00–0.07)
Basophils Absolute: 0.1 10*3/uL (ref 0.0–0.1)
Basophils Relative: 1 %
Eosinophils Absolute: 0.2 10*3/uL (ref 0.0–0.5)
Eosinophils Relative: 3 %
HCT: 36.9 % — ABNORMAL LOW (ref 39.0–52.0)
Hemoglobin: 12 g/dL — ABNORMAL LOW (ref 13.0–17.0)
Immature Granulocytes: 1 %
Lymphocytes Relative: 29 %
Lymphs Abs: 2 10*3/uL (ref 0.7–4.0)
MCH: 28.2 pg (ref 26.0–34.0)
MCHC: 32.5 g/dL (ref 30.0–36.0)
MCV: 86.8 fL (ref 80.0–100.0)
Monocytes Absolute: 0.6 10*3/uL (ref 0.1–1.0)
Monocytes Relative: 8 %
Neutro Abs: 4.2 10*3/uL (ref 1.7–7.7)
Neutrophils Relative %: 58 %
Platelets: 209 10*3/uL (ref 150–400)
RBC: 4.25 MIL/uL (ref 4.22–5.81)
RDW: 13.7 % (ref 11.5–15.5)
WBC: 7.1 10*3/uL (ref 4.0–10.5)
nRBC: 0 % (ref 0.0–0.2)

## 2024-05-22 LAB — GLUCOSE, CAPILLARY
Glucose-Capillary: 208 mg/dL — ABNORMAL HIGH (ref 70–99)
Glucose-Capillary: 216 mg/dL — ABNORMAL HIGH (ref 70–99)
Glucose-Capillary: 189 mg/dL — ABNORMAL HIGH (ref 70–99)
Glucose-Capillary: 260 mg/dL — ABNORMAL HIGH (ref 70–99)

## 2024-05-22 LAB — SEDIMENTATION RATE: Sed Rate: 57 mm/h — ABNORMAL HIGH (ref 0–16)

## 2024-05-22 LAB — C-REACTIVE PROTEIN: CRP: 1.8 mg/dL — ABNORMAL HIGH (ref <1.0)

## 2024-05-22 LAB — MAGNESIUM: Magnesium: 1.7 mg/dL (ref 1.7–2.4)

## 2024-05-22 LAB — PHOSPHORUS: Phosphorus: 4.6 mg/dL (ref 2.5–4.6)

## 2024-05-22 MED ORDER — GADOBUTROL 1 MMOL/ML IV SOLN
10.0000 mL | Freq: Once | INTRAVENOUS | Status: AC | PRN
Start: 2024-05-22 — End: 2024-05-22
  Administered 2024-05-22: 10 mL via INTRAVENOUS

## 2024-05-22 MED ORDER — METOPROLOL TARTRATE 5 MG/5ML IV SOLN: 5.0000 mg | INTRAVENOUS | Status: DC | PRN

## 2024-05-22 MED ORDER — IPRATROPIUM-ALBUTEROL 0.5-2.5 (3) MG/3ML IN SOLN: 3.0000 mL | RESPIRATORY_TRACT | Status: DC | PRN

## 2024-05-22 MED ORDER — HYDRALAZINE HCL 20 MG/ML IJ SOLN: 10.0000 mg | INTRAMUSCULAR | Status: DC | PRN

## 2024-05-22 MED ORDER — PERFLUTREN LIPID MICROSPHERE
1.0000 mL | INTRAVENOUS | Status: AC | PRN
  Administered 2024-05-22: 3 mL via INTRAVENOUS

## 2024-05-22 MED ORDER — SENNOSIDES-DOCUSATE SODIUM 8.6-50 MG PO TABS
1.0000 | ORAL_TABLET | Freq: Every evening | ORAL | Status: DC | PRN
  Administered 2024-05-23: 1 via ORAL
  Filled 2024-05-22: qty 1

## 2024-05-22 MED ORDER — GUAIFENESIN 100 MG/5ML PO LIQD
5.0000 mL | ORAL | Status: DC | PRN
  Administered 2024-05-28: 5 mL via ORAL
  Filled 2024-05-22: qty 15

## 2024-05-22 MED ORDER — TRAZODONE HCL 50 MG PO TABS
50.0000 mg | ORAL_TABLET | Freq: Every evening | ORAL | Status: DC | PRN
  Administered 2024-05-22 – 2024-05-28 (×4): 50 mg via ORAL
  Filled 2024-05-22 (×4): qty 1

## 2024-05-22 MED ORDER — LABETALOL HCL 5 MG/ML IV SOLN
20.0000 mg | INTRAVENOUS | Status: DC | PRN
  Filled 2024-05-22: qty 4

## 2024-05-22 MED ORDER — GLUCAGON HCL RDNA (DIAGNOSTIC) 1 MG IJ SOLR: 1.0000 mg | INTRAMUSCULAR | Status: DC | PRN

## 2024-05-22 NOTE — Hospital Course (Addendum)
 Brief Narrative:   63 year old with history of DM2, COPD, HTN, HLD, CKD 3A, admitted to [facility] for infected right foot wound with cellulitis in the setting of diabetes.  This foot wound occurred about 2 weeks ago and has been worsening since then.  X-ray of the foot was suggestive of cellulitis, MRI also suggestive of cellulitis and no evidence of osteomyelitis.  Initially started on broad-spectrum antibiotics later transitioned to linezolid .  Completed 7-day course on 6/9. Now hospital course was complicated by some volume overload, and AKI.  AKI suspected from possible volume overload and contrast-induced nephropathy.  Nephrology is following.  Assessment & Plan:  Principal Problem:   Cellulitis of right lower extremity Active Problems:   DM2 (diabetes mellitus, type 2) (HCC)   Essential hypertension   HLD (hyperlipidemia)   Elevated troponin   Thoracic aortic aneurysm (TAA) (HCC)   History of COPD   CKD stage 3a, GFR 45-59 ml/min (HCC)    AKI on CKD stage IIIa - Baseline creatinine 1.5 now continues to trend up.  Today 2.5.  Wonder if this is contrast-induced nephropathy but due to some signs of volume overload he is also getting IV Lasix .  Nephrology team is following.  Renal ultrasound is negative  Right foot infected diabetic foot wound with surrounding cellulitis - Started about 2 weeks ago, x-ray shows cellulitic changes. Which was confirmed on MRI foot, no evidece of osteo.  Wound cultures are growing MSSA. - Initially on broad-spectrum antibiotics later transitioned to linezolid  and completed 7-day course on 6/9 (Photo under media section)   Diabetes mellitus type 2, insulin -dependent, uncontrolled due to hyperglycemia Peripheral neuropathy - A1c 12.6.  Adjust insulin  due to uncontrolled blood sugars - Continue gabapentin , adjust dosing  Peripheral interstitial/groundglass opacity in the lungs - Seen on the CTA chest.  No obvious evidence of pulmonary infection.  As needed  bronchodilators.  Currently on room air.  Diuretics per nephrology  Elevated troponin - No chest pain, EKG is normal.  Really insignificant elevation at this time. -Echo shows EF of 45%, grade 1 DD.  History of COPD - Bronchodilators  Hyperlipidemia - Statin  Essential hypertension - On aspirin  and statin - Toprol -XL.  IV as needed   Thoracic aortic aneurysm, 4 cm - Known history of this.  Follow-up outpatient PCP   DVT prophylaxis: SCDs Start: 05/21/24 1950    Code Status: Full Code Family Communication:   Continue hospital stay until renal function improves.      Subjective: Resting comfortably, does not have any complaints today  Examination:  General exam: Appears calm and comfortable  Respiratory system: Bilateral rhonchi, improved Cardiovascular system: S1 & S2 heard, RRR. No JVD, murmurs, rubs, gallops or clicks. No pedal edema. Gastrointestinal system: Abdomen is nondistended, soft and nontender. No organomegaly or masses felt. Normal bowel sounds heard. Central nervous system: Alert and oriented. No focal neurological deficits. Extremities: Left-sided AKA.  Right foot puncture wound on plantar surface.  Surrounding erythema and warmth Skin: No rashes, lesions or ulcers Psychiatry: Judgement and insight appear normal. Mood & affect appropriate.

## 2024-05-22 NOTE — Progress Notes (Signed)
 Right lower extremity venous duplex has been completed. Preliminary results can be found in CV Proc through chart review.   05/22/24 10:00 AM Birda Buffy RVT

## 2024-05-22 NOTE — Inpatient Diabetes Management (Addendum)
 Inpatient Diabetes Program Recommendations  AACE/ADA: New Consensus Statement on Inpatient Glycemic Control (2015)  Target Ranges:  Prepandial:   less than 140 mg/dL      Peak postprandial:   less than 180 mg/dL (1-2 hours)      Critically ill patients:  140 - 180 mg/dL   Lab Results  Component Value Date   GLUCAP 208 (H) 05/22/2024   HGBA1C 12.6 (H) 05/21/2024    Review of Glycemic Control  Latest Reference Range & Units 05/21/24 22:38 05/22/24 08:33  Glucose-Capillary 70 - 99 mg/dL 161 (H) 096 (H)   Diabetes history: DM  Outpatient Diabetes medications:  Novolog  6-10 units tid with meals Lantus  60 units bid Current orders for Inpatient glycemic control:  Novolog  0-15 units tid with meals and HS Semglee  25 units bid  Inpatient Diabetes Program Recommendations:    Note A1C>12%. This is consistent with patient's A1C last year.   Will see patient today today discuss.   Addendum 11:15 a:  Spoke to patient at bedside regarding elevated A1C.  He states that he is taking insulin  as ordered.  He reports blood sugars in the 200's.  We reviewed that his A1C indicates average blood sugars of 314 mg/dL.  He states that he sticks his fingers for monitoring.  Note that he does not have a smart phone.  He states he gives insulin  injections in the right side of his abdomen and does not rotate.  We discussed importance of rotation of sites.  I asked if he sometimes forgets his insulin  and he states "sometimes but not often".  We briefly discussed if he has been on any other medications for his DM.  He states he was on metformin but this was stopped when he started insulin .  May benefit from restart of metformin and/or GLP to help with weight and PP blood sugars.  Briefly discussed this with patient as well.  Interestingly, he has only had 25 units of basal insulin  this AM and blood sugar is 208 mg/dL. Patient admits to dietary indiscretion as well.  Reminded him of importance of glycemic control.     Thanks,  Josefa Ni, RN, BC-ADM Inpatient Diabetes Coordinator Pager [phone]  (8a-5p)

## 2024-05-22 NOTE — Plan of Care (Signed)

## 2024-05-22 NOTE — Progress Notes (Signed)
  Echocardiogram 2D Echocardiogram has been performed.  Fain Home RDCS 05/22/2024, 11:01 AM

## 2024-05-22 NOTE — Progress Notes (Signed)
 PROGRESS NOTE    Samuel Bailey  UJW:[id number] DOB: 1961/06/05 DOA: 05/21/2024 PCP: Alonso Jan., MD    Brief Narrative:   63 year old with history of DM2, COPD, HTN, HLD, CKD 3A, admitted to [facility] for infected right foot wound with cellulitis in the setting of diabetes.  This foot wound occurred about 2 weeks ago and has been worsening since then.  Assessment & Plan:  Principal Problem:   Cellulitis of right lower extremity Active Problems:   DM2 (diabetes mellitus, type 2) (HCC)   Essential hypertension   HLD (hyperlipidemia)   Elevated troponin   Thoracic aortic aneurysm (TAA) (HCC)   History of COPD   CKD stage 3a, GFR 45-59 ml/min (HCC)   Right foot infected diabetic foot wound with surrounding cellulitis - Started about 2 weeks ago, x-ray shows cellulitic changes but unable to rule out osteomyelitis therefore MRI done, results pending - Broad-spectrum antibiotics: Vancomycin  Rocephin  and Flagyl (Photo under media section)  Elevated troponin - No chest pain, EKG is normal.  Really insignificant elevation at this time. -Echo shows EF of 45%, grade 1 DD.  Peripheral interstitial/groundglass opacity in the lungs - Seen on the CTA chest.  No obvious evidence of pulmonary infection.  As needed bronchodilators.  Currently on room air.  Diabetes mellitus type 2, insulin -dependent Peripheral neuropathy -Check A1c - Sliding scale and Accu-Chek.  Continue Lantus .  Adjust as necessary - Gabapentin   History of COPD - Bronchodilators  Hyperlipidemia - Statin  Essential hypertension - On aspirin  and statin - Toprol -XL.  IV as needed  CKD stage IIIa - Creatinine at baseline 1.5  Thoracic aortic aneurysm, 4 cm - Known history of this.  Follow-up outpatient PCP   DVT prophylaxis: SCDs Start: 05/21/24 1950      Code Status: Full Code Family Communication:   Continue hospital stay for management of cellulitis with concerns of  osteomyelitis    Subjective:  Seen at side.  Tells me he does not know how he developed infection in his right foot.  He does have severe neuropathy and is wheelchair-bound  Examination:  General exam: Appears calm and comfortable  Respiratory system: Clear to auscultation. Respiratory effort normal. Cardiovascular system: S1 & S2 heard, RRR. No JVD, murmurs, rubs, gallops or clicks. No pedal edema. Gastrointestinal system: Abdomen is nondistended, soft and nontender. No organomegaly or masses felt. Normal bowel sounds heard. Central nervous system: Alert and oriented. No focal neurological deficits. Extremities: Left-sided AKA.  Right foot puncture wound on plantar surface.  Surrounding erythema and warmth Skin: No rashes, lesions or ulcers Psychiatry: Judgement and insight appear normal. Mood & affect appropriate.                Diet Orders (From admission, onward)     Start     Ordered   05/21/24 1950  Diet regular Room service appropriate? Yes; Fluid consistency: Thin  Diet effective now       Question Answer Comment  Room service appropriate? Yes   Fluid consistency: Thin      05/21/24 1950            Objective: Vitals:   05/22/24 0405 05/22/24 0500 05/22/24 0835 05/22/24 1132  BP: 136/72  (!) 157/78 (!) 157/78  Pulse: 79  86 86  Resp: 17  19   Temp: 98.4 F (36.9 C)  98.3 F (36.8 C)   TempSrc:      SpO2: 97%  96%   Weight:  111.4 kg  Height:        Intake/Output Summary (Last 24 hours) at 05/22/2024 1143 Last data filed at 05/21/2024 1625 Gross per 24 hour  Intake 101.24 ml  Output --  Net 101.24 ml   Filed Weights   05/21/24 1214 05/22/24 0500  Weight: 111.1 kg 111.4 kg    Scheduled Meds:  aspirin  EC  81 mg Oral Daily   atorvastatin   40 mg Oral Daily   gabapentin   300 mg Oral TID   insulin  aspart  0-15 Units Subcutaneous TID WC   insulin  aspart  0-5 Units Subcutaneous QHS   insulin  glargine-yfgn  25 Units Subcutaneous BID    metoprolol  succinate  12.5 mg Oral Daily   pantoprazole   40 mg Oral BID   umeclidinium bromide   1 puff Inhalation Daily   Continuous Infusions:  cefTRIAXone  (ROCEPHIN )  IV     metronidazole 500 mg (05/22/24 0847)   vancomycin       Nutritional status     Body mass index is 37.47 kg/m.  Data Reviewed:   CBC: Recent Labs  Lab 05/21/24 1226 05/22/24 0549  WBC 7.6 7.1  NEUTROABS  --  4.2  HGB 12.6* 12.0*  HCT 38.9* 36.9*  MCV 87.4 86.8  PLT 235 209   Basic Metabolic Panel: Recent Labs  Lab 05/21/24 1226 05/21/24 1515 05/22/24 0549  NA 137  --  135  K 4.6  --  3.9  CL 106  --  105  CO2 21*  --  22  GLUCOSE 240*  --  199*  BUN 22  --  22  CREATININE 1.56*  --  1.68*  CALCIUM  8.6*  --  8.5*  MG  --  1.7 1.7  PHOS  --   --  4.6   GFR: Estimated Creatinine Clearance: 55.1 mL/min (A) (by C-G formula based on SCr of 1.68 mg/dL (H)). Liver Function Tests: Recent Labs  Lab 05/22/24 0549  AST 8*  ALT 10  ALKPHOS 60  BILITOT 0.4  PROT 5.4*  ALBUMIN 2.1*   No results for input(s): "LIPASE", "AMYLASE" in the last 168 hours. No results for input(s): "AMMONIA" in the last 168 hours. Coagulation Profile: No results for input(s): "INR", "PROTIME" in the last 168 hours. Cardiac Enzymes: No results for input(s): "CKTOTAL", "CKMB", "CKMBINDEX", "TROPONINI" in the last 168 hours. BNP (last 3 results) No results for input(s): "PROBNP" in the last 8760 hours. HbA1C: Recent Labs    05/21/24 1223  HGBA1C 12.6*   CBG: Recent Labs  Lab 05/21/24 2238 05/22/24 0833  GLUCAP 289* 208*   Lipid Profile: No results for input(s): "CHOL", "HDL", "LDLCALC", "TRIG", "CHOLHDL", "LDLDIRECT" in the last 72 hours. Thyroid Function Tests: No results for input(s): "TSH", "T4TOTAL", "FREET4", "T3FREE", "THYROIDAB" in the last 72 hours. Anemia Panel: No results for input(s): "VITAMINB12", "FOLATE", "FERRITIN", "TIBC", "IRON", "RETICCTPCT" in the last 72 hours. Sepsis Labs: Recent  Labs  Lab 05/21/24 1515  PROCALCITON <0.10    Recent Results (from the past 240 hours)  Resp panel by RT-PCR (RSV, Flu A&B, Covid) Anterior Nasal Swab     Status: None   Collection Time: 05/21/24  2:50 PM   Specimen: Anterior Nasal Swab  Result Value Ref Range Status   SARS Coronavirus 2 by RT PCR NEGATIVE NEGATIVE Final   Influenza A by PCR NEGATIVE NEGATIVE Final   Influenza B by PCR NEGATIVE NEGATIVE Final    Comment: (NOTE) The Xpert Xpress SARS-CoV-2/FLU/RSV plus assay is intended as an aid in the diagnosis  of influenza from Nasopharyngeal swab specimens and should not be used as a sole basis for treatment. Nasal washings and aspirates are unacceptable for Xpert Xpress SARS-CoV-2/FLU/RSV testing.  Fact Sheet for Patients: BloggerCourse.com  Fact Sheet for Healthcare Providers: SeriousBroker.it  This test is not yet approved or cleared by the [place]  FDA and has been authorized for detection and/or diagnosis of SARS-CoV-2 by FDA under an Emergency Use Authorization (EUA). This EUA will remain in effect (meaning this test can be used) for the duration of the COVID-19 declaration under Section 564(b)(1) of the Act, 21 U.S.C. section 360bbb-3(b)(1), unless the authorization is terminated or revoked.     Resp Syncytial Virus by PCR NEGATIVE NEGATIVE Final    Comment: (NOTE) Fact Sheet for Patients: BloggerCourse.com  Fact Sheet for Healthcare Providers: SeriousBroker.it  This test is not yet approved or cleared by the [place]  FDA and has been authorized for detection and/or diagnosis of SARS-CoV-2 by FDA under an Emergency Use Authorization (EUA). This EUA will remain in effect (meaning this test can be used) for the duration of the COVID-19 declaration under Section 564(b)(1) of the Act, 21 U.S.C. section 360bbb-3(b)(1), unless the authorization is terminated  or revoked.  Performed at [facility], [address] [place], [place] [address]          Radiology Studies: ECHOCARDIOGRAM COMPLETE Result Date: 05/22/2024    ECHOCARDIOGRAM REPORT   Patient Name:   Samuel Bailey Date of Exam: 05/22/2024 Medical Rec #:  [id number]     Height:       67.9 in Accession #:    [id number]    Weight:       245.7 lb Date of Birth:  18-Oct-1961     BSA:          2.229 m Patient Age:    63 years      BP:           157/78 mmHg Patient Gender: M             HR:           89 bpm. Exam Location:  Inpatient Procedure: 2D Echo, Color Doppler, Cardiac Doppler and Intracardiac            Opacification Agent (Both Spectral and Color Flow Doppler were            utilized during procedure). Indications:    Elevated Trop  History:        Patient has prior history of Echocardiogram examinations, most                 recent 05/22/2023.  Sonographer:    Hersey Lorenzo RDCS Referring Phys: [id number] JUSTIN B HOWERTER IMPRESSIONS  1. Left ventricular ejection fraction, by estimation, is 40 to 45%. The left ventricle has mildly decreased function. The left ventricle demonstrates global hypokinesis. There is moderate concentric left ventricular hypertrophy. Left ventricular diastolic parameters are consistent with Grade I diastolic dysfunction (impaired relaxation).  2. Right ventricular systolic function is normal. The right ventricular size is normal.  3. The mitral valve is normal in structure. Trivial mitral valve regurgitation. No evidence of mitral stenosis.  4. The aortic valve is normal in structure. Aortic valve regurgitation is not visualized. No aortic stenosis is present.  5. There is borderline dilatation of the aortic root, measuring 38 mm. There is borderline dilatation of the ascending aorta, measuring 38 mm.  6. The inferior vena cava is normal in size with  greater than 50% respiratory variability, suggesting right atrial pressure of 3 mmHg. FINDINGS  Left Ventricle: Left  ventricular ejection fraction, by estimation, is 40 to 45%. The left ventricle has mildly decreased function. The left ventricle demonstrates global hypokinesis. Definity contrast agent was given IV to delineate the left ventricular  endocardial borders. The left ventricular internal cavity size was normal in size. There is moderate concentric left ventricular hypertrophy. Left ventricular diastolic parameters are consistent with Grade I diastolic dysfunction (impaired relaxation). Right Ventricle: The right ventricular size is normal. No increase in right ventricular wall thickness. Right ventricular systolic function is normal. Left Atrium: Left atrial size was normal in size. Right Atrium: Right atrial size was normal in size. Pericardium: There is no evidence of pericardial effusion. Mitral Valve: The mitral valve is normal in structure. Trivial mitral valve regurgitation. No evidence of mitral valve stenosis. Tricuspid Valve: The tricuspid valve is normal in structure. Tricuspid valve regurgitation is trivial. No evidence of tricuspid stenosis. Aortic Valve: The aortic valve is normal in structure. Aortic valve regurgitation is not visualized. No aortic stenosis is present. Pulmonic Valve: The pulmonic valve was normal in structure. Pulmonic valve regurgitation is trivial. No evidence of pulmonic stenosis. Aorta: The aortic root is normal in size and structure. There is borderline dilatation of the aortic root, measuring 38 mm. There is borderline dilatation of the ascending aorta, measuring 38 mm. Venous: The inferior vena cava is normal in size with greater than 50% respiratory variability, suggesting right atrial pressure of 3 mmHg. IAS/Shunts: No atrial level shunt detected by color flow Doppler.  LEFT VENTRICLE PLAX 2D LVIDd:         5.10 cm      Diastology LVIDs:         4.80 cm      LV e' medial:    6.09 cm/s LV PW:         1.30 cm      LV E/e' medial:  16.7 LV IVS:        1.30 cm      LV e' lateral:    11.60 cm/s LVOT diam:     2.40 cm      LV E/e' lateral: 8.8 LV SV:         74 LV SV Index:   33 LVOT Area:     4.52 cm  LV Volumes (MOD) LV vol d, MOD A2C: 150.0 ml LV vol d, MOD A4C: 202.0 ml LV vol s, MOD A2C: 82.9 ml LV vol s, MOD A4C: 78.9 ml LV SV MOD A2C:     67.1 ml LV SV MOD A4C:     202.0 ml LV SV MOD BP:      96.7 ml RIGHT VENTRICLE         IVC TAPSE (M-mode): 2.0 cm  IVC diam: 2.20 cm LEFT ATRIUM             Index        RIGHT ATRIUM           Index LA diam:        3.70 cm 1.66 cm/m   RA Area:     24.70 cm LA Vol (A2C):   48.2 ml 21.63 ml/m  RA Volume:   85.10 ml  38.18 ml/m LA Vol (A4C):   50.0 ml 22.43 ml/m LA Biplane Vol: 49.4 ml 22.17 ml/m  AORTIC VALVE LVOT Vmax:   87.60 cm/s LVOT Vmean:  62.800 cm/s LVOT VTI:  0.163 m  AORTA Ao Root diam: 3.80 cm Ao Asc diam:  3.80 cm MITRAL VALVE MV Area (PHT): 4.99 cm     SHUNTS MV Decel Time: 152 msec     Systemic VTI:  0.16 m MV E velocity: 102.00 cm/s  Systemic Diam: 2.40 cm MV A velocity: 92.80 cm/s MV E/A ratio:  1.10 Jules Oar MD Electronically signed by Jules Oar MD Signature Date/Time: 05/22/2024/11:29:31 AM    Final    VAS US  LOWER EXTREMITY VENOUS (DVT) (ONLY [facility] & [facility]) Result Date: 05/22/2024  Lower Venous DVT Study Patient Name:  Samuel Bailey  Date of Exam:   05/22/2024 Medical Rec #: [id number]      Accession #:    [id number] Date of Birth: 13-May-1961      Patient Gender: M Patient Age:   7 years Exam Location:  [facility] Procedure:      VAS US  LOWER EXTREMITY VENOUS (DVT) Referring Phys: Paris Bolds --------------------------------------------------------------------------------  Indications: Swelling.  Risk Factors: None identified. Limitations: Body habitus and poor ultrasound/tissue interface. Comparison Study: No prior studies. Performing Technologist: Lerry Ransom RVT  Examination Guidelines: A complete evaluation includes B-mode imaging, spectral Doppler, color Doppler, and power Doppler as needed of all  accessible portions of each vessel. Bilateral testing is considered an integral part of a complete examination. Limited examinations for reoccurring indications may be performed as noted. The reflux portion of the exam is performed with the patient in reverse Trendelenburg.  +---------+---------------+---------+-----------+----------+-------------------+ RIGHT    CompressibilityPhasicitySpontaneityPropertiesThrombus Aging      +---------+---------------+---------+-----------+----------+-------------------+ CFV      Full           Yes      Yes                                      +---------+---------------+---------+-----------+----------+-------------------+ SFJ      Full                                                             +---------+---------------+---------+-----------+----------+-------------------+ FV Prox  Full                                                             +---------+---------------+---------+-----------+----------+-------------------+ FV Mid   Full                                                             +---------+---------------+---------+-----------+----------+-------------------+ FV DistalFull                                                             +---------+---------------+---------+-----------+----------+-------------------+ PFV      Full                                                             +---------+---------------+---------+-----------+----------+-------------------+  POP      Full           Yes      Yes                                      +---------+---------------+---------+-----------+----------+-------------------+ PTV      Full                                                             +---------+---------------+---------+-----------+----------+-------------------+ PERO                                                  Not well visualized  +---------+---------------+---------+-----------+----------+-------------------+   +----+---------------+---------+-----------+----------+--------------+ LEFTCompressibilityPhasicitySpontaneityPropertiesThrombus Aging +----+---------------+---------+-----------+----------+--------------+ CFV Full           Yes      Yes                                 +----+---------------+---------+-----------+----------+--------------+    Summary: RIGHT: - There is no evidence of deep vein thrombosis in the lower extremity. However, portions of this examination were limited- see technologist comments above.  - No cystic structure found in the popliteal fossa.  LEFT: - No evidence of common femoral vein obstruction.   *See table(s) above for measurements and observations.    Preliminary    CT Angio Chest PE W/Cm &/Or Wo Cm Result Date: 05/21/2024 CLINICAL DATA:  High probability for PE.  Shortness of breath. EXAM: CT ANGIOGRAPHY CHEST WITH CONTRAST TECHNIQUE: Multidetector CT imaging of the chest was performed using the standard protocol during bolus administration of intravenous contrast. Multiplanar CT image reconstructions and MIPs were obtained to evaluate the vascular anatomy. RADIATION DOSE REDUCTION: This exam was performed according to the departmental dose-optimization program which includes automated exposure control, adjustment of the mA and/or kV according to patient size and/or use of iterative reconstruction technique. CONTRAST:  75mL OMNIPAQUE  IOHEXOL  350 MG/ML SOLN COMPARISON:  CT PE for call 10/29/2019 FINDINGS: Cardiovascular: The ascending aorta is dilated measuring 4 cm, unchanged. There is adequate opacification of the pulmonary arteries to the segmental level. There is no evidence for pulmonary embolism. Mediastinum/Nodes: No enlarged mediastinal, hilar, or axillary lymph nodes. Thyroid gland, trachea, and esophagus demonstrate no significant findings. Lungs/Pleura: There is some peripheral  interstitial and ground-glass opacities in the bilateral lower lobes, nonspecific. There are minimal emphysematous changes per fluid in the left lower lobe. There is no pleural effusion or pneumothorax. There is a 2 mm right lower lobe pulmonary nodule image 6/61. Upper Abdomen: No acute abnormality. Musculoskeletal: No chest wall abnormality. No acute osseous findings. Review of the MIP images confirms the above findings. IMPRESSION: 1. No evidence for pulmonary embolism. 2. Stable dilatation of the ascending aorta measuring 4 cm. Recommend annual imaging followup by CTA or MRA. This recommendation follows 2010 ACCF/AHA/AATS/ACR/ASA/SCA/SCAI/SIR/STS/SVM Guidelines for the Diagnosis and Management of Patients with Thoracic Aortic Disease. Circulation. 2010; 121: Z610-R604. Aortic aneurysm NOS (ICD10-I71.9) 3. Peripheral interstitial and ground-glass opacities in the bilateral lower lobes, nonspecific. Findings may be  infectious/inflammatory. 4. 2 mm right solid pulmonary nodule. No follow-up needed if patient is low-risk.This recommendation follows the consensus statement: Guidelines for Management of Incidental Pulmonary Nodules Detected on CT Images: From the Fleischner Society 2017; Radiology 2017; 284:228-243. Electronically Signed   By: Tyron Gallon M.D.   On: 05/21/2024 17:08   DG Foot 2 Views Right Result Date: 05/21/2024 CLINICAL DATA:  Right foot wound. EXAM: RIGHT FOOT - 2 VIEW COMPARISON:  12/30/2021. FINDINGS: Soft tissue ulceration at the plantar midfoot. No definite evidence of acute osteolysis or erosive changes identified. 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 Moderate degenerative changes of the first and second MTP joints. Diffuse interphalangeal joint space narrowing. Degenerative changes of  the tibiotalar and posterior subtalar joints. Plantar calcaneal spur. Subcutaneous edema of the foot, most pronounced at the plantar aspect. IMPRESSION: 1. Soft tissue ulceration at the plantar midfoot with surrounding subcutaneous edema. No definite evidence of acute osteolysis or erosive changes identified. 2. 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 3. Moderate degenerative changes of the hindfoot and forefoot. Electronically Signed   By: Mannie Seek M.D.   On: 05/21/2024 14:53   DG Chest 2 View Result Date: 05/21/2024 CLINICAL DATA:  Shortness of breath. EXAM: CHEST - 2 VIEW COMPARISON:  05/19/2023. FINDINGS: The heart size and mediastinal contours are within normal limits. No focal consolidation, pleural effusion, or pneumothorax. Postsurgical changes of the left proximal humerus. No acute osseous abnormality. IMPRESSION: No acute cardiopulmonary findings. Electronically Signed   By: Mannie Seek M.D.   On: 05/21/2024 14:44           LOS: 0 days   Time spent= 35 mins    Maggie Schooner, MD [facility]  If 7PM-7AM, please contact night-coverage  05/22/2024, 11:43 AM

## 2024-05-23 DIAGNOSIS — L03115 Cellulitis of right lower limb: Secondary | ICD-10-CM | POA: Diagnosis not present

## 2024-05-23 LAB — BASIC METABOLIC PANEL WITH GFR
Anion gap: 8 (ref 5–15)
BUN: 22 mg/dL (ref 8–23)
CO2: 21 mmol/L — ABNORMAL LOW (ref 22–32)
Calcium: 8.5 mg/dL — ABNORMAL LOW (ref 8.9–10.3)
Chloride: 106 mmol/L (ref 98–111)
Creatinine, Ser: 2.03 mg/dL — ABNORMAL HIGH (ref 0.61–1.24)
GFR, Estimated: 36 mL/min — ABNORMAL LOW (ref >60)
Glucose, Bld: 261 mg/dL — ABNORMAL HIGH (ref 70–99)
Potassium: 4.1 mmol/L (ref 3.5–5.1)
Sodium: 135 mmol/L (ref 135–145)

## 2024-05-23 LAB — CBC
HCT: 36.1 % — ABNORMAL LOW (ref 39.0–52.0)
Hemoglobin: 11.9 g/dL — ABNORMAL LOW (ref 13.0–17.0)
MCH: 28.9 pg (ref 26.0–34.0)
MCHC: 33 g/dL (ref 30.0–36.0)
MCV: 87.6 fL (ref 80.0–100.0)
Platelets: 201 10*3/uL (ref 150–400)
RBC: 4.12 MIL/uL — ABNORMAL LOW (ref 4.22–5.81)
RDW: 13.6 % (ref 11.5–15.5)
WBC: 7.4 10*3/uL (ref 4.0–10.5)
nRBC: 0 % (ref 0.0–0.2)

## 2024-05-23 LAB — GLUCOSE, RANDOM: Glucose, Bld: 377 mg/dL — ABNORMAL HIGH (ref 70–99)

## 2024-05-23 LAB — GLUCOSE, CAPILLARY
Glucose-Capillary: 275 mg/dL — ABNORMAL HIGH (ref 70–99)
Glucose-Capillary: 414 mg/dL — ABNORMAL HIGH (ref 70–99)
Glucose-Capillary: 207 mg/dL — ABNORMAL HIGH (ref 70–99)
Glucose-Capillary: 224 mg/dL — ABNORMAL HIGH (ref 70–99)

## 2024-05-23 LAB — MAGNESIUM: Magnesium: 1.9 mg/dL (ref 1.7–2.4)

## 2024-05-23 LAB — PHOSPHORUS: Phosphorus: 4.6 mg/dL (ref 2.5–4.6)

## 2024-05-23 MED ORDER — LINEZOLID 600 MG PO TABS
600.0000 mg | ORAL_TABLET | Freq: Two times a day (BID) | ORAL | Status: AC
  Administered 2024-05-23 – 2024-05-27 (×10): 600 mg via ORAL
  Filled 2024-05-23 (×10): qty 1

## 2024-05-23 MED ORDER — SODIUM CHLORIDE 0.9 % IV SOLN: 2.0000 g | INTRAVENOUS | Status: DC

## 2024-05-23 MED ORDER — INSULIN ASPART 100 UNIT/ML IJ SOLN
4.0000 [IU] | Freq: Three times a day (TID) | INTRAMUSCULAR | Status: DC
  Administered 2024-05-23 (×2): 4 [IU] via SUBCUTANEOUS

## 2024-05-23 MED ORDER — METRONIDAZOLE 500 MG PO TABS: 500.0000 mg | ORAL_TABLET | Freq: Two times a day (BID) | ORAL | Status: DC

## 2024-05-23 MED ORDER — INSULIN GLARGINE-YFGN 100 UNIT/ML ~~LOC~~ SOLN
35.0000 [IU] | Freq: Two times a day (BID) | SUBCUTANEOUS | Status: DC
  Administered 2024-05-23: 35 [IU] via SUBCUTANEOUS
  Filled 2024-05-23 (×4): qty 0.35

## 2024-05-23 MED ORDER — ENOXAPARIN SODIUM 60 MG/0.6ML IJ SOSY
55.0000 mg | PREFILLED_SYRINGE | INTRAMUSCULAR | Status: DC
  Administered 2024-05-23 – 2024-05-30 (×8): 55 mg via SUBCUTANEOUS
  Filled 2024-05-23 (×8): qty 0.6

## 2024-05-23 MED ORDER — INSULIN GLARGINE-YFGN 100 UNIT/ML ~~LOC~~ SOLN
27.0000 [IU] | Freq: Two times a day (BID) | SUBCUTANEOUS | Status: DC
  Administered 2024-05-23: 27 [IU] via SUBCUTANEOUS
  Filled 2024-05-23 (×2): qty 0.27

## 2024-05-23 MED ORDER — SODIUM CHLORIDE 0.9 % IV BOLUS
500.0000 mL | Freq: Once | INTRAVENOUS | Status: AC
  Administered 2024-05-23: 500 mL via INTRAVENOUS

## 2024-05-23 NOTE — Progress Notes (Signed)
 Transition of Care [facility]) - Inpatient Brief Assessment   Patient Details  Name: Samuel Bailey MRN: [id number] Date of Birth: 03-03-1961  Transition of Care [facility] CM/SW Contact:    Dane Dung, RN Phone Number: 05/23/2024, 3:23 PM   Clinical Narrative: Patient admitted to the hospital for RLE cellulitis and is currently being treated for antibiotics.  No TOC team needs at this time.   Transition of Care Asessment: Insurance and Status: (P) Insurance coverage has been reviewed Patient has primary care physician: (P) Yes Home environment has been reviewed: (P) from home   Prior/Current Home Services: (P) No current home services Social Drivers of Health Review: (P) SDOH reviewed interventions complete Readmission risk has been reviewed: (P) Yes Transition of care needs: (P) no transition of care needs at this time

## 2024-05-23 NOTE — Plan of Care (Signed)

## 2024-05-23 NOTE — Progress Notes (Signed)
 PROGRESS NOTE    Samuel Bailey  ZOX:[id number] DOB: May 16, 1961 DOA: 05/21/2024 PCP: Alonso Jan., MD    Brief Narrative:   63 year old with history of DM2, COPD, HTN, HLD, CKD 3A, admitted to [facility] for infected right foot wound with cellulitis in the setting of diabetes.  This foot wound occurred about 2 weeks ago and has been worsening since then.  X-ray of the foot was suggestive of cellulitis, MRI also suggestive of cellulitis and no evidence of osteomyelitis.  Initially started on broad-spectrum antibiotics eventually transition to p.o. linezolid to complete total 7-day course.  Case was discussed with ID about this as well.  Assessment & Plan:  Principal Problem:   Cellulitis of right lower extremity Active Problems:   DM2 (diabetes mellitus, type 2) (HCC)   Essential hypertension   HLD (hyperlipidemia)   Elevated troponin   Thoracic aortic aneurysm (TAA) (HCC)   History of COPD   CKD stage 3a, GFR 45-59 ml/min (HCC)   Right foot infected diabetic foot wound with surrounding cellulitis - Started about 2 weeks ago, x-ray shows cellulitic changes. Which was confirmed on MRI foot, no evidece of osteo.  Wound cultures are growing MSSA. - Broad-spectrum antibiotics: Vancomycin  Rocephin  and Flagyl > to Linezolid (discussed with ID).  (Photo under media section)  Elevated troponin - No chest pain, EKG is normal.  Really insignificant elevation at this time. -Echo shows EF of 45%, grade 1 DD.   AKI on CKD stage IIIa - Creatinine at baseline 1.5, creatinine today 2.03.  Will give normal saline bolus  Peripheral interstitial/groundglass opacity in the lungs - Seen on the CTA chest.  No obvious evidence of pulmonary infection.  As needed bronchodilators.  Currently on room air.  Diabetes mellitus type 2, insulin -dependent Peripheral neuropathy - A1c 12.6.  Adjust insulin  due to uncontrolled blood sugars - Continue gabapentin   History of COPD -  Bronchodilators  Hyperlipidemia - Statin  Essential hypertension - On aspirin  and statin - Toprol -XL.  IV as needed   Thoracic aortic aneurysm, 4 cm - Known history of this.  Follow-up outpatient PCP   DVT prophylaxis: SCDs Start: 05/21/24 1950    Code Status: Full Code Family Communication:   Continue hospital stay until renal function stabilizes    Subjective: Doing ok   Examination:  General exam: Appears calm and comfortable  Respiratory system: Clear to auscultation. Respiratory effort normal. Cardiovascular system: S1 & S2 heard, RRR. No JVD, murmurs, rubs, gallops or clicks. No pedal edema. Gastrointestinal system: Abdomen is nondistended, soft and nontender. No organomegaly or masses felt. Normal bowel sounds heard. Central nervous system: Alert and oriented. No focal neurological deficits. Extremities: Left-sided AKA.  Right foot puncture wound on plantar surface.  Surrounding erythema and warmth Skin: No rashes, lesions or ulcers Psychiatry: Judgement and insight appear normal. Mood & affect appropriate.                Diet Orders (From admission, onward)     Start     Ordered   05/21/24 1950  Diet regular Room service appropriate? Yes; Fluid consistency: Thin  Diet effective now       Question Answer Comment  Room service appropriate? Yes   Fluid consistency: Thin      05/21/24 1950            Objective: Vitals:   05/23/24 0500 05/23/24 0726 05/23/24 0751 05/23/24 1010  BP:   (!) 166/68 (!) 166/68  Pulse:  82 90  Resp:  18 19   Temp:   98 F (36.7 C)   TempSrc:      SpO2:  96% 96%   Weight: 117.4 kg     Height:        Intake/Output Summary (Last 24 hours) at 05/23/2024 1302 Last data filed at 05/23/2024 0800 Gross per 24 hour  Intake 301.15 ml  Output 2425 ml  Net -2123.85 ml   Filed Weights   05/21/24 1214 05/22/24 0500 05/23/24 0500  Weight: 111.1 kg 111.4 kg 117.4 kg    Scheduled Meds:  aspirin  EC  81 mg Oral Daily    atorvastatin   40 mg Oral Daily   enoxaparin (LOVENOX) injection  55 mg Subcutaneous Q24H   gabapentin   300 mg Oral TID   insulin  aspart  0-15 Units Subcutaneous TID WC   insulin  aspart  0-5 Units Subcutaneous QHS   insulin  aspart  4 Units Subcutaneous TID WC   insulin  glargine-yfgn  27 Units Subcutaneous BID   linezolid  600 mg Oral Q12H   metoprolol  succinate  12.5 mg Oral Daily   pantoprazole   40 mg Oral BID   umeclidinium bromide   1 puff Inhalation Daily   Continuous Infusions:  Nutritional status     Body mass index is 39.47 kg/m.  Data Reviewed:   CBC: Recent Labs  Lab 05/21/24 1226 05/22/24 0549 05/23/24 0640  WBC 7.6 7.1 7.4  NEUTROABS  --  4.2  --   HGB 12.6* 12.0* 11.9*  HCT 38.9* 36.9* 36.1*  MCV 87.4 86.8 87.6  PLT 235 209 201   Basic Metabolic Panel: Recent Labs  Lab 05/21/24 1226 05/21/24 1515 05/22/24 0549 05/23/24 0640  NA 137  --  135 135  K 4.6  --  3.9 4.1  CL 106  --  105 106  CO2 21*  --  22 21*  GLUCOSE 240*  --  199* 261*  BUN 22  --  22 22  CREATININE 1.56*  --  1.68* 2.03*  CALCIUM  8.6*  --  8.5* 8.5*  MG  --  1.7 1.7 1.9  PHOS  --   --  4.6 4.6   GFR: Estimated Creatinine Clearance: 46.9 mL/min (A) (by C-G formula based on SCr of 2.03 mg/dL (H)). Liver Function Tests: Recent Labs  Lab 05/22/24 0549  AST 8*  ALT 10  ALKPHOS 60  BILITOT 0.4  PROT 5.4*  ALBUMIN 2.1*   No results for input(s): "LIPASE", "AMYLASE" in the last 168 hours. No results for input(s): "AMMONIA" in the last 168 hours. Coagulation Profile: No results for input(s): "INR", "PROTIME" in the last 168 hours. Cardiac Enzymes: No results for input(s): "CKTOTAL", "CKMB", "CKMBINDEX", "TROPONINI" in the last 168 hours. BNP (last 3 results) No results for input(s): "PROBNP" in the last 8760 hours. HbA1C: Recent Labs    05/21/24 1223  HGBA1C 12.6*   CBG: Recent Labs  Lab 05/22/24 1236 05/22/24 1701 05/22/24 2034 05/23/24 0750 05/23/24 1143   GLUCAP 216* 189* 260* 275* 414*   Lipid Profile: No results for input(s): "CHOL", "HDL", "LDLCALC", "TRIG", "CHOLHDL", "LDLDIRECT" in the last 72 hours. Thyroid Function Tests: No results for input(s): "TSH", "T4TOTAL", "FREET4", "T3FREE", "THYROIDAB" in the last 72 hours. Anemia Panel: No results for input(s): "VITAMINB12", "FOLATE", "FERRITIN", "TIBC", "IRON", "RETICCTPCT" in the last 72 hours. Sepsis Labs: Recent Labs  Lab 05/21/24 1515  PROCALCITON <0.10    Recent Results (from the past 240 hours)  Resp panel by RT-PCR (RSV, Flu A&B, Covid)  Anterior Nasal Swab     Status: None   Collection Time: 05/21/24  2:50 PM   Specimen: Anterior Nasal Swab  Result Value Ref Range Status   SARS Coronavirus 2 by RT PCR NEGATIVE NEGATIVE Final   Influenza A by PCR NEGATIVE NEGATIVE Final   Influenza B by PCR NEGATIVE NEGATIVE Final    Comment: (NOTE) The Xpert Xpress SARS-CoV-2/FLU/RSV plus assay is intended as an aid in the diagnosis of influenza from Nasopharyngeal swab specimens and should not be used as a sole basis for treatment. Nasal washings and aspirates are unacceptable for Xpert Xpress SARS-CoV-2/FLU/RSV testing.  Fact Sheet for Patients: BloggerCourse.com  Fact Sheet for Healthcare Providers: SeriousBroker.it  This test is not yet approved or cleared by the [place]  FDA and has been authorized for detection and/or diagnosis of SARS-CoV-2 by FDA under an Emergency Use Authorization (EUA). This EUA will remain in effect (meaning this test can be used) for the duration of the COVID-19 declaration under Section 564(b)(1) of the Act, 21 U.S.C. section 360bbb-3(b)(1), unless the authorization is terminated or revoked.     Resp Syncytial Virus by PCR NEGATIVE NEGATIVE Final    Comment: (NOTE) Fact Sheet for Patients: BloggerCourse.com  Fact Sheet for Healthcare  Providers: SeriousBroker.it  This test is not yet approved or cleared by the [place]  FDA and has been authorized for detection and/or diagnosis of SARS-CoV-2 by FDA under an Emergency Use Authorization (EUA). This EUA will remain in effect (meaning this test can be used) for the duration of the COVID-19 declaration under Section 564(b)(1) of the Act, 21 U.S.C. section 360bbb-3(b)(1), unless the authorization is terminated or revoked.  Performed at [facility], [address], [place], [place] [address]          Radiology Studies: VAS US  LOWER EXTREMITY VENOUS (DVT) (ONLY [facility] & [facility]) Result Date: 05/22/2024  Lower Venous DVT Study Patient Name:  Samuel Bailey  Date of Exam:   05/22/2024 Medical Rec #: [id number]      Accession #:    [id number] Date of Birth: [birth year]      Patient Gender: M Patient Age:   13 years Exam Location:  [facility] Procedure:      VAS US  LOWER EXTREMITY VENOUS (DVT) Referring Phys: Paris Bolds --------------------------------------------------------------------------------  Indications: Swelling.  Risk Factors: None identified. Limitations: Body habitus and poor ultrasound/tissue interface. Comparison Study: No prior studies. Performing Technologist: Lerry Ransom RVT  Examination Guidelines: A complete evaluation includes B-mode imaging, spectral Doppler, color Doppler, and power Doppler as needed of all accessible portions of each vessel. Bilateral testing is considered an integral part of a complete examination. Limited examinations for reoccurring indications may be performed as noted. The reflux portion of the exam is performed with the patient in reverse Trendelenburg.  +---------+---------------+---------+-----------+----------+-------------------+ RIGHT    CompressibilityPhasicitySpontaneityPropertiesThrombus Aging      +---------+---------------+---------+-----------+----------+-------------------+ CFV       Full           Yes      Yes                                      +---------+---------------+---------+-----------+----------+-------------------+ SFJ      Full                                                             +---------+---------------+---------+-----------+----------+-------------------+  FV Prox  Full                                                             +---------+---------------+---------+-----------+----------+-------------------+ FV Mid   Full                                                             +---------+---------------+---------+-----------+----------+-------------------+ FV DistalFull                                                             +---------+---------------+---------+-----------+----------+-------------------+ PFV      Full                                                             +---------+---------------+---------+-----------+----------+-------------------+ POP      Full           Yes      Yes                                      +---------+---------------+---------+-----------+----------+-------------------+ PTV      Full                                                             +---------+---------------+---------+-----------+----------+-------------------+ PERO                                                  Not well visualized +---------+---------------+---------+-----------+----------+-------------------+   +----+---------------+---------+-----------+----------+--------------+ LEFTCompressibilityPhasicitySpontaneityPropertiesThrombus Aging +----+---------------+---------+-----------+----------+--------------+ CFV Full           Yes      Yes                                 +----+---------------+---------+-----------+----------+--------------+    Summary: RIGHT: - There is no evidence of deep vein thrombosis in the lower extremity. However, portions of this examination were limited-  see technologist comments above.  - No cystic structure found in the popliteal fossa.  LEFT: - No evidence of common femoral vein obstruction.   *See table(s) above for measurements and observations. Electronically signed by Jimmye Moulds MD on 05/22/2024 at 2:18:48 PM.    Final    MR FOOT RIGHT W WO CONTRAST Result Date: 05/22/2024 CLINICAL DATA:  Right foot wound.  Concern for osteomyelitis.  EXAM: MRI OF THE RIGHT FOREFOOT WITHOUT AND WITH CONTRAST TECHNIQUE: Multiplanar, multisequence MR imaging of the right foot was performed before and after the administration of intravenous contrast. CONTRAST:  10mL GADAVIST  GADOBUTROL  1 MMOL/ML IV SOLN COMPARISON:  Right foot radiograph dated 05/21/2024. MRI of the right foot dated 10/26/2019. FINDINGS: Bones/Joint/Cartilage No marrow signal abnormality identified to suggest acute osteomyelitis. No acute fracture. No joint effusion. 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 Calcaneal enthesopathy. Ligaments Medial and lateral ankle ligamentous complex is grossly intact. Lisfranc ligament is not clearly identified and likely chronically torn. Muscles and Tendons Flexor, peroneal and extensor compartment tendons are intact. Posterior tibialis tendinosis. Generalized T2 hyperintense signal throughout the visualized plantar musculature is nonspecific and likely relates to chronic denervation changes. Soft tissue Focal soft tissue ulceration at the plantar midfoot, overlying the level of the lateral cuneiform and cuboid, with mild surrounding edema and enhancement, which could reflect cellulitis. No loculated fluid collection. Nonspecific subcutaneous edema extending along the dorsal foot without significant associated enhancement.  IMPRESSION: 1. Focal soft tissue ulceration at the plantar midfoot with mild surrounding edema and enhancement, which could reflect cellulitis. No abscess. 2. No evidence of acute osteomyelitis identified. 3. 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 4. Posterior tibialis tendinosis. Electronically Signed   By: Mannie Seek M.D.   On: 05/22/2024 12:25   ECHOCARDIOGRAM COMPLETE Result Date: 05/22/2024    ECHOCARDIOGRAM REPORT   Patient Name:   Samuel Bailey Date of Exam: 05/22/2024 Medical Rec #:  [id number]     Height:       67.9 in Accession #:    [id number]    Weight:       245.7 lb Date of Birth:  [birth year]     BSA:          2.229 m Patient Age:    63 years      BP:           157/78 mmHg Patient Gender: M             HR:           89 bpm. Exam Location:  Inpatient Procedure: 2D Echo, Color Doppler, Cardiac Doppler and Intracardiac            Opacification Agent (Both Spectral and Color Flow Doppler were            utilized during procedure). Indications:    Elevated Trop  History:        Patient has prior history of Echocardiogram examinations, most                 recent 05/22/2023.  Sonographer:    Hersey Lorenzo RDCS Referring Phys: [id number] JUSTIN B HOWERTER IMPRESSIONS  1. Left ventricular ejection fraction, by estimation, is 40 to 45%. The left ventricle has mildly decreased function. The left ventricle demonstrates global hypokinesis. There is moderate concentric left ventricular hypertrophy. Left ventricular diastolic parameters are consistent with Grade I diastolic dysfunction (impaired relaxation).  2. Right ventricular systolic function is normal. The right ventricular size is normal.  3. The mitral valve is normal in structure. Trivial mitral valve regurgitation. No evidence of mitral stenosis.  4. The aortic valve is normal in structure. Aortic valve regurgitation  is not visualized. No  aortic stenosis is present.  5. There is borderline dilatation of the aortic root, measuring 38 mm. There is borderline dilatation of the ascending aorta, measuring 38 mm.  6. The inferior vena cava is normal in size with greater than 50% respiratory variability, suggesting right atrial pressure of 3 mmHg. FINDINGS  Left Ventricle: Left ventricular ejection fraction, by estimation, is 40 to 45%. The left ventricle has mildly decreased function. The left ventricle demonstrates global hypokinesis. Definity contrast agent was given IV to delineate the left ventricular  endocardial borders. The left ventricular internal cavity size was normal in size. There is moderate concentric left ventricular hypertrophy. Left ventricular diastolic parameters are consistent with Grade I diastolic dysfunction (impaired relaxation). Right Ventricle: The right ventricular size is normal. No increase in right ventricular wall thickness. Right ventricular systolic function is normal. Left Atrium: Left atrial size was normal in size. Right Atrium: Right atrial size was normal in size. Pericardium: There is no evidence of pericardial effusion. Mitral Valve: The mitral valve is normal in structure. Trivial mitral valve regurgitation. No evidence of mitral valve stenosis. Tricuspid Valve: The tricuspid valve is normal in structure. Tricuspid valve regurgitation is trivial. No evidence of tricuspid stenosis. Aortic Valve: The aortic valve is normal in structure. Aortic valve regurgitation is not visualized. No aortic stenosis is present. Pulmonic Valve: The pulmonic valve was normal in structure. Pulmonic valve regurgitation is trivial. No evidence of pulmonic stenosis. Aorta: The aortic root is normal in size and structure. There is borderline dilatation of the aortic root, measuring 38 mm. There is borderline dilatation of the ascending aorta, measuring 38 mm. Venous: The inferior vena cava is normal in size with greater than 50%  respiratory variability, suggesting right atrial pressure of 3 mmHg. IAS/Shunts: No atrial level shunt detected by color flow Doppler.  LEFT VENTRICLE PLAX 2D LVIDd:         5.10 cm      Diastology LVIDs:         4.80 cm      LV e' medial:    6.09 cm/s LV PW:         1.30 cm      LV E/e' medial:  16.7 LV IVS:        1.30 cm      LV e' lateral:   11.60 cm/s LVOT diam:     2.40 cm      LV E/e' lateral: 8.8 LV SV:         74 LV SV Index:   33 LVOT Area:     4.52 cm  LV Volumes (MOD) LV vol d, MOD A2C: 150.0 ml LV vol d, MOD A4C: 202.0 ml LV vol s, MOD A2C: 82.9 ml LV vol s, MOD A4C: 78.9 ml LV SV MOD A2C:     67.1 ml LV SV MOD A4C:     202.0 ml LV SV MOD BP:      96.7 ml RIGHT VENTRICLE         IVC TAPSE (M-mode): 2.0 cm  IVC diam: 2.20 cm LEFT ATRIUM             Index        RIGHT ATRIUM           Index LA diam:        3.70 cm 1.66 cm/m   RA Area:     24.70 cm LA Vol (A2C):   48.2 ml 21.63 ml/m  RA Volume:   85.10 ml  38.18 ml/m LA Vol (A4C):   50.0 ml 22.43 ml/m LA Biplane Vol: 49.4 ml 22.17 ml/m  AORTIC VALVE LVOT Vmax:   87.60 cm/s LVOT Vmean:  62.800 cm/s LVOT VTI:    0.163 m  AORTA Ao Root diam: 3.80 cm Ao Asc diam:  3.80 cm MITRAL VALVE MV Area (PHT): 4.99 cm     SHUNTS MV Decel Time: 152 msec     Systemic VTI:  0.16 m MV E velocity: 102.00 cm/s  Systemic Diam: 2.40 cm MV A velocity: 92.80 cm/s MV E/A ratio:  1.10 Jules Oar MD Electronically signed by Jules Oar MD Signature Date/Time: 05/22/2024/11:29:31 AM    Final    CT Angio Chest PE W/Cm &/Or Wo Cm Result Date: 05/21/2024 CLINICAL DATA:  High probability for PE.  Shortness of breath. EXAM: CT ANGIOGRAPHY CHEST WITH CONTRAST TECHNIQUE: Multidetector CT imaging of the chest was performed using the standard protocol during bolus administration of intravenous contrast. Multiplanar CT image reconstructions and MIPs were obtained to evaluate the vascular anatomy. RADIATION DOSE REDUCTION: This exam was performed according to the departmental  dose-optimization program which includes automated exposure control, adjustment of the mA and/or kV according to patient size and/or use of iterative reconstruction technique. CONTRAST:  75mL OMNIPAQUE  IOHEXOL  350 MG/ML SOLN COMPARISON:  CT PE for call 10/29/2019 FINDINGS: Cardiovascular: The ascending aorta is dilated measuring 4 cm, unchanged. There is adequate opacification of the pulmonary arteries to the segmental level. There is no evidence for pulmonary embolism. Mediastinum/Nodes: No enlarged mediastinal, hilar, or axillary lymph nodes. Thyroid gland, trachea, and esophagus demonstrate no significant findings. Lungs/Pleura: There is some peripheral interstitial and ground-glass opacities in the bilateral lower lobes, nonspecific. There are minimal emphysematous changes per fluid in the left lower lobe. There is no pleural effusion or pneumothorax. There is a 2 mm right lower lobe pulmonary nodule image 6/61. Upper Abdomen: No acute abnormality. Musculoskeletal: No chest wall abnormality. No acute osseous findings. Review of the MIP images confirms the above findings. IMPRESSION: 1. No evidence for pulmonary embolism. 2. Stable dilatation of the ascending aorta measuring 4 cm. Recommend annual imaging followup by CTA or MRA. This recommendation follows 2010 ACCF/AHA/AATS/ACR/ASA/SCA/SCAI/SIR/STS/SVM Guidelines for the Diagnosis and Management of Patients with Thoracic Aortic Disease. Circulation. 2010; 121: N829-F621. Aortic aneurysm NOS (ICD10-I71.9) 3. Peripheral interstitial and ground-glass opacities in the bilateral lower lobes, nonspecific. Findings may be infectious/inflammatory. 4. 2 mm right solid pulmonary nodule. No follow-up needed if patient is low-risk.This recommendation follows the consensus statement: Guidelines for Management of Incidental Pulmonary Nodules Detected on CT Images: From the Fleischner Society 2017; Radiology 2017; 284:228-243. Electronically Signed   By: Tyron Gallon M.D.    On: 05/21/2024 17:08   DG Foot 2 Views Right Result Date: 05/21/2024 CLINICAL DATA:  Right foot wound. EXAM: RIGHT FOOT - 2 VIEW COMPARISON:  12/30/2021. FINDINGS: Soft tissue ulceration at the plantar midfoot. No definite evidence of acute osteolysis or erosive changes identified. 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 Moderate degenerative changes of the first and second MTP joints. Diffuse interphalangeal joint space narrowing. Degenerative changes of the tibiotalar and posterior subtalar joints. Plantar calcaneal spur. Subcutaneous edema of the foot, most pronounced at the plantar aspect. IMPRESSION: 1. Soft tissue ulceration at the plantar midfoot with surrounding subcutaneous edema. No definite evidence  of acute osteolysis or erosive changes identified. 2. 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 3. Moderate degenerative changes of the hindfoot and forefoot. Electronically Signed   By: Mannie Seek M.D.   On: 05/21/2024 14:53   DG Chest 2 View Result Date: 05/21/2024 CLINICAL DATA:  Shortness of breath. EXAM: CHEST - 2 VIEW COMPARISON:  05/19/2023. FINDINGS: The heart size and mediastinal contours are within normal limits. No focal consolidation, pleural effusion, or pneumothorax. Postsurgical changes of the left proximal humerus. No acute osseous abnormality. IMPRESSION: No acute cardiopulmonary findings. Electronically Signed   By: Mannie Seek M.D.   On: 05/21/2024 14:44           LOS: 1 day   Time spent= 35 mins    Maggie Schooner, MD [facility]  If 7PM-7AM, please  contact night-coverage  05/23/2024, 1:02 PM

## 2024-05-23 NOTE — Progress Notes (Signed)
 Ok to add lovenox 0.5mg /kg/day for DVT prophylaxis per Dr. Ariel Begun.  Ivery Marking, PharmD, BCIDP, AAHIVP, CPP Infectious Disease Pharmacist 05/23/2024 8:01 AM

## 2024-05-24 ENCOUNTER — Inpatient Hospital Stay (HOSPITAL_COMMUNITY)

## 2024-05-24 DIAGNOSIS — L03115 Cellulitis of right lower limb: Secondary | ICD-10-CM | POA: Diagnosis not present

## 2024-05-24 LAB — BASIC METABOLIC PANEL WITH GFR
Anion gap: 9 (ref 5–15)
BUN: 23 mg/dL (ref 8–23)
CO2: 22 mmol/L (ref 22–32)
Calcium: 8.4 mg/dL — ABNORMAL LOW (ref 8.9–10.3)
Chloride: 104 mmol/L (ref 98–111)
Creatinine, Ser: 2.18 mg/dL — ABNORMAL HIGH (ref 0.61–1.24)
GFR, Estimated: 33 mL/min — ABNORMAL LOW (ref >60)
Glucose, Bld: 198 mg/dL — ABNORMAL HIGH (ref 70–99)
Potassium: 4.4 mmol/L (ref 3.5–5.1)
Sodium: 135 mmol/L (ref 135–145)

## 2024-05-24 LAB — CBC
HCT: 37.4 % — ABNORMAL LOW (ref 39.0–52.0)
Hemoglobin: 12.3 g/dL — ABNORMAL LOW (ref 13.0–17.0)
MCH: 28.7 pg (ref 26.0–34.0)
MCHC: 32.9 g/dL (ref 30.0–36.0)
MCV: 87.4 fL (ref 80.0–100.0)
Platelets: 228 10*3/uL (ref 150–400)
RBC: 4.28 MIL/uL (ref 4.22–5.81)
RDW: 13.4 % (ref 11.5–15.5)
WBC: 10.4 10*3/uL (ref 4.0–10.5)
nRBC: 0 % (ref 0.0–0.2)

## 2024-05-24 LAB — GLUCOSE, CAPILLARY
Glucose-Capillary: 271 mg/dL — ABNORMAL HIGH (ref 70–99)
Glucose-Capillary: 316 mg/dL — ABNORMAL HIGH (ref 70–99)
Glucose-Capillary: 170 mg/dL — ABNORMAL HIGH (ref 70–99)
Glucose-Capillary: 191 mg/dL — ABNORMAL HIGH (ref 70–99)

## 2024-05-24 LAB — MAGNESIUM: Magnesium: 2 mg/dL (ref 1.7–2.4)

## 2024-05-24 MED ORDER — INSULIN ASPART 100 UNIT/ML IJ SOLN
6.0000 [IU] | Freq: Three times a day (TID) | INTRAMUSCULAR | Status: DC
  Administered 2024-05-24 – 2024-05-30 (×20): 6 [IU] via SUBCUTANEOUS

## 2024-05-24 MED ORDER — INSULIN GLARGINE-YFGN 100 UNIT/ML ~~LOC~~ SOLN
37.0000 [IU] | Freq: Two times a day (BID) | SUBCUTANEOUS | Status: DC
  Administered 2024-05-24 – 2024-05-26 (×5): 37 [IU] via SUBCUTANEOUS
  Filled 2024-05-24 (×6): qty 0.37

## 2024-05-24 MED ORDER — SODIUM CHLORIDE 0.9 % IV SOLN: INTRAVENOUS | Status: DC

## 2024-05-24 NOTE — Progress Notes (Signed)
 PROGRESS NOTE    Samuel Bailey  RUE:[id number] DOB: [birth year] DOA: 05/21/2024 PCP: Alonso Jan., MD    Brief Narrative:   63 year old with history of DM2, COPD, HTN, HLD, CKD 3A, admitted to [facility] for infected right foot wound with cellulitis in the setting of diabetes.  This foot wound occurred about 2 weeks ago and has been worsening since then.  X-ray of the foot was suggestive of cellulitis, MRI also suggestive of cellulitis and no evidence of osteomyelitis.  Initially started on broad-spectrum antibiotics eventually transition to p.o. linezolid to complete total 7-day course.  Case was discussed with ID about this as well.  Assessment & Plan:  Principal Problem:   Cellulitis of right lower extremity Active Problems:   DM2 (diabetes mellitus, type 2) (HCC)   Essential hypertension   HLD (hyperlipidemia)   Elevated troponin   Thoracic aortic aneurysm (TAA) (HCC)   History of COPD   CKD stage 3a, GFR 45-59 ml/min (HCC)   Right foot infected diabetic foot wound with surrounding cellulitis - Started about 2 weeks ago, x-ray shows cellulitic changes. Which was confirmed on MRI foot, no evidece of osteo.  Wound cultures are growing MSSA. - Broad-spectrum antibiotics: Vancomycin  Rocephin  and Flagyl > to Linezolid (discussed with ID).  EOT 6/9 (Photo under media section)  Elevated troponin - No chest pain, EKG is normal.  Really insignificant elevation at this time. -Echo shows EF of 45%, grade 1 DD.   AKI on CKD stage IIIa - Creatinine at baseline 1.5, creatinine trending up, today 2.18 - Renal ultrasound negative  Peripheral interstitial/groundglass opacity in the lungs - Seen on the CTA chest.  No obvious evidence of pulmonary infection.  As needed bronchodilators.  Currently on room air.  Diabetes mellitus type 2, insulin -dependent Peripheral neuropathy - A1c 12.6.  Adjust insulin  due to uncontrolled blood sugars - Continue gabapentin   History of COPD -  Bronchodilators  Hyperlipidemia - Statin  Essential hypertension - On aspirin  and statin - Toprol -XL.  IV as needed   Thoracic aortic aneurysm, 4 cm - Known history of this.  Follow-up outpatient PCP   DVT prophylaxis: SCDs Start: 05/21/24 1950    Code Status: Full Code Family Communication:   Continue hospital stay until renal function improves.  Hopefully discharge tomorrow    Subjective: No complaints at this time.   Examination:  General exam: Appears calm and comfortable  Respiratory system: Clear to auscultation. Respiratory effort normal. Cardiovascular system: S1 & S2 heard, RRR. No JVD, murmurs, rubs, gallops or clicks. No pedal edema. Gastrointestinal system: Abdomen is nondistended, soft and nontender. No organomegaly or masses felt. Normal bowel sounds heard. Central nervous system: Alert and oriented. No focal neurological deficits. Extremities: Left-sided AKA.  Right foot puncture wound on plantar surface.  Surrounding erythema and warmth Skin: No rashes, lesions or ulcers Psychiatry: Judgement and insight appear normal. Mood & affect appropriate.                Diet Orders (From admission, onward)     Start     Ordered   05/21/24 1950  Diet regular Room service appropriate? Yes; Fluid consistency: Thin  Diet effective now       Question Answer Comment  Room service appropriate? Yes   Fluid consistency: Thin      05/21/24 1950            Objective: Vitals:   05/23/24 2008 05/24/24 0500 05/24/24 0537 05/24/24 0751  BP: (!) 145/82  Aaron Aas)  148/71 (!) 158/79  Pulse: 89  79 86  Resp: 18  18 19   Temp: 99.1 F (37.3 C)  98.1 F (36.7 C) 97.7 F (36.5 C)  TempSrc: Oral  Oral   SpO2: 99%  98% 100%  Weight:  119 kg    Height:        Intake/Output Summary (Last 24 hours) at 05/24/2024 0858 Last data filed at 05/24/2024 0600 Gross per 24 hour  Intake --  Output 3050 ml  Net -3050 ml   Filed Weights   05/22/24 0500 05/23/24 0500  05/24/24 0500  Weight: 111.4 kg 117.4 kg 119 kg    Scheduled Meds:  aspirin  EC  81 mg Oral Daily   atorvastatin   40 mg Oral Daily   enoxaparin (LOVENOX) injection  55 mg Subcutaneous Q24H   gabapentin   300 mg Oral TID   insulin  aspart  0-15 Units Subcutaneous TID WC   insulin  aspart  0-5 Units Subcutaneous QHS   insulin  aspart  6 Units Subcutaneous TID WC   insulin  glargine-yfgn  37 Units Subcutaneous BID   linezolid  600 mg Oral Q12H   metoprolol  succinate  12.5 mg Oral Daily   pantoprazole   40 mg Oral BID   umeclidinium bromide   1 puff Inhalation Daily   Continuous Infusions:  sodium chloride       Nutritional status     Body mass index is 40 kg/m.  Data Reviewed:   CBC: Recent Labs  Lab 05/21/24 1226 05/22/24 0549 05/23/24 0640 05/24/24 0301  WBC 7.6 7.1 7.4 10.4  NEUTROABS  --  4.2  --   --   HGB 12.6* 12.0* 11.9* 12.3*  HCT 38.9* 36.9* 36.1* 37.4*  MCV 87.4 86.8 87.6 87.4  PLT 235 209 201 228   Basic Metabolic Panel: Recent Labs  Lab 05/21/24 1226 05/21/24 1515 05/22/24 0549 05/23/24 0640 05/23/24 1248 05/24/24 0301  NA 137  --  135 135  --  135  K 4.6  --  3.9 4.1  --  4.4  CL 106  --  105 106  --  104  CO2 21*  --  22 21*  --  22  GLUCOSE 240*  --  199* 261* 377* 198*  BUN 22  --  22 22  --  23  CREATININE 1.56*  --  1.68* 2.03*  --  2.18*  CALCIUM  8.6*  --  8.5* 8.5*  --  8.4*  MG  --  1.7 1.7 1.9  --  2.0  PHOS  --   --  4.6 4.6  --   --    GFR: Estimated Creatinine Clearance: 44 mL/min (A) (by C-G formula based on SCr of 2.18 mg/dL (H)). Liver Function Tests: Recent Labs  Lab 05/22/24 0549  AST 8*  ALT 10  ALKPHOS 60  BILITOT 0.4  PROT 5.4*  ALBUMIN 2.1*   No results for input(s): "LIPASE", "AMYLASE" in the last 168 hours. No results for input(s): "AMMONIA" in the last 168 hours. Coagulation Profile: No results for input(s): "INR", "PROTIME" in the last 168 hours. Cardiac Enzymes: No results for input(s): "CKTOTAL", "CKMB",  "CKMBINDEX", "TROPONINI" in the last 168 hours. BNP (last 3 results) No results for input(s): "PROBNP" in the last 8760 hours. HbA1C: Recent Labs    05/21/24 1223  HGBA1C 12.6*   CBG: Recent Labs  Lab 05/23/24 0750 05/23/24 1143 05/23/24 1700 05/23/24 2023 05/24/24 0842  GLUCAP 275* 414* 207* 224* 271*   Lipid Profile: No results for  input(s): "CHOL", "HDL", "LDLCALC", "TRIG", "CHOLHDL", "LDLDIRECT" in the last 72 hours. Thyroid Function Tests: No results for input(s): "TSH", "T4TOTAL", "FREET4", "T3FREE", "THYROIDAB" in the last 72 hours. Anemia Panel: No results for input(s): "VITAMINB12", "FOLATE", "FERRITIN", "TIBC", "IRON", "RETICCTPCT" in the last 72 hours. Sepsis Labs: Recent Labs  Lab 05/21/24 1515  PROCALCITON <0.10    Recent Results (from the past 240 hours)  Resp panel by RT-PCR (RSV, Flu A&B, Covid) Anterior Nasal Swab     Status: None   Collection Time: 05/21/24  2:50 PM   Specimen: Anterior Nasal Swab  Result Value Ref Range Status   SARS Coronavirus 2 by RT PCR NEGATIVE NEGATIVE Final   Influenza A by PCR NEGATIVE NEGATIVE Final   Influenza B by PCR NEGATIVE NEGATIVE Final    Comment: (NOTE) The Xpert Xpress SARS-CoV-2/FLU/RSV plus assay is intended as an aid in the diagnosis of influenza from Nasopharyngeal swab specimens and should not be used as a sole basis for treatment. Nasal washings and aspirates are unacceptable for Xpert Xpress SARS-CoV-2/FLU/RSV testing.  Fact Sheet for Patients: BloggerCourse.com  Fact Sheet for Healthcare Providers: SeriousBroker.it  This test is not yet approved or cleared by the [place]  FDA and has been authorized for detection and/or diagnosis of SARS-CoV-2 by FDA under an Emergency Use Authorization (EUA). This EUA will remain in effect (meaning this test can be used) for the duration of the COVID-19 declaration under Section 564(b)(1) of the Act, 21  U.S.C. section 360bbb-3(b)(1), unless the authorization is terminated or revoked.     Resp Syncytial Virus by PCR NEGATIVE NEGATIVE Final    Comment: (NOTE) Fact Sheet for Patients: BloggerCourse.com  Fact Sheet for Healthcare Providers: SeriousBroker.it  This test is not yet approved or cleared by the [place]  FDA and has been authorized for detection and/or diagnosis of SARS-CoV-2 by FDA under an Emergency Use Authorization (EUA). This EUA will remain in effect (meaning this test can be used) for the duration of the COVID-19 declaration under Section 564(b)(1) of the Act, 21 U.S.C. section 360bbb-3(b)(1), unless the authorization is terminated or revoked.  Performed at [facility], [address] [place], [place] [address]          Radiology Studies: US  RENAL Result Date: 05/24/2024 CLINICAL DATA:  [id number] AKI (acute kidney injury) (HCC) [id number] EXAM: RENAL / URINARY TRACT ULTRASOUND COMPLETE COMPARISON:  None Available. FINDINGS: Right Kidney: Renal measurements: 11.4 x 5.7 x 7.1 cm = volume: 243 mL. Normal echogenicity. No mass. No hydronephrosis or nephrolithiasis. Left Kidney: Renal measurements: 13.4 x 5.9 x 6 cm = volume: 249 mL. Normal echogenicity. No mass. No hydronephrosis or nephrolithiasis. Bladder: Appears normal for degree of bladder distention. Other: Mild hepatic steatosis. IMPRESSION: 1. No hydronephrosis or nephrolithiasis. 2. Mild hepatic steatosis. Electronically Signed   By: Rance Burrows M.D.   On: 05/24/2024 08:14   VAS US  LOWER EXTREMITY VENOUS (DVT) (ONLY [facility] & [facility]) Result Date: 05/22/2024  Lower Venous DVT Study Patient Name:  KARMINE KAUER  Date of Exam:   05/22/2024 Medical Rec #: [id number]      Accession #:    [id number] Date of Birth: Aug 12, 1961      Patient Gender: M Patient Age:   75 years Exam Location:  [facility] Procedure:      VAS US  LOWER EXTREMITY VENOUS (DVT) Referring Phys:  Paris Bolds --------------------------------------------------------------------------------  Indications: Swelling.  Risk Factors: None identified. Limitations: Body habitus and poor ultrasound/tissue interface. Comparison Study: No  prior studies. Performing Technologist: Lerry Ransom RVT  Examination Guidelines: A complete evaluation includes B-mode imaging, spectral Doppler, color Doppler, and power Doppler as needed of all accessible portions of each vessel. Bilateral testing is considered an integral part of a complete examination. Limited examinations for reoccurring indications may be performed as noted. The reflux portion of the exam is performed with the patient in reverse Trendelenburg.  +---------+---------------+---------+-----------+----------+-------------------+ RIGHT    CompressibilityPhasicitySpontaneityPropertiesThrombus Aging      +---------+---------------+---------+-----------+----------+-------------------+ CFV      Full           Yes      Yes                                      +---------+---------------+---------+-----------+----------+-------------------+ SFJ      Full                                                             +---------+---------------+---------+-----------+----------+-------------------+ FV Prox  Full                                                             +---------+---------------+---------+-----------+----------+-------------------+ FV Mid   Full                                                             +---------+---------------+---------+-----------+----------+-------------------+ FV DistalFull                                                             +---------+---------------+---------+-----------+----------+-------------------+ PFV      Full                                                             +---------+---------------+---------+-----------+----------+-------------------+ POP      Full            Yes      Yes                                      +---------+---------------+---------+-----------+----------+-------------------+ PTV      Full                                                             +---------+---------------+---------+-----------+----------+-------------------+  PERO                                                  Not well visualized +---------+---------------+---------+-----------+----------+-------------------+   +----+---------------+---------+-----------+----------+--------------+ LEFTCompressibilityPhasicitySpontaneityPropertiesThrombus Aging +----+---------------+---------+-----------+----------+--------------+ CFV Full           Yes      Yes                                 +----+---------------+---------+-----------+----------+--------------+    Summary: RIGHT: - There is no evidence of deep vein thrombosis in the lower extremity. However, portions of this examination were limited- see technologist comments above.  - No cystic structure found in the popliteal fossa.  LEFT: - No evidence of common femoral vein obstruction.   *See table(s) above for measurements and observations. Electronically signed by Jimmye Moulds MD on 05/22/2024 at 2:18:48 PM.    Final    ECHOCARDIOGRAM COMPLETE Result Date: 05/22/2024    ECHOCARDIOGRAM REPORT   Patient Name:   ANUAR WALGREN Date of Exam: 05/22/2024 Medical Rec #:  [id number]     Height:       67.9 in Accession #:    [id number]    Weight:       245.7 lb Date of Birth:  16-Aug-1961     BSA:          2.229 m Patient Age:    63 years      BP:           157/78 mmHg Patient Gender: M             HR:           89 bpm. Exam Location:  Inpatient Procedure: 2D Echo, Color Doppler, Cardiac Doppler and Intracardiac            Opacification Agent (Both Spectral and Color Flow Doppler were            utilized during procedure). Indications:    Elevated Trop  History:        Patient has prior history of Echocardiogram  examinations, most                 recent 05/22/2023.  Sonographer:    Hersey Lorenzo RDCS Referring Phys: [id number] JUSTIN B HOWERTER IMPRESSIONS  1. Left ventricular ejection fraction, by estimation, is 40 to 45%. The left ventricle has mildly decreased function. The left ventricle demonstrates global hypokinesis. There is moderate concentric left ventricular hypertrophy. Left ventricular diastolic parameters are consistent with Grade I diastolic dysfunction (impaired relaxation).  2. Right ventricular systolic function is normal. The right ventricular size is normal.  3. The mitral valve is normal in structure. Trivial mitral valve regurgitation. No evidence of mitral stenosis.  4. The aortic valve is normal in structure. Aortic valve regurgitation is not visualized. No aortic stenosis is present.  5. There is borderline dilatation of the aortic root, measuring 38 mm. There is borderline dilatation of the ascending aorta, measuring 38 mm.  6. The inferior vena cava is normal in size with greater than 50% respiratory variability, suggesting right atrial pressure of 3 mmHg. FINDINGS  Left Ventricle: Left ventricular ejection fraction, by estimation, is 40 to 45%. The left ventricle has mildly decreased function. The left ventricle demonstrates global hypokinesis. Definity contrast agent was  given IV to delineate the left ventricular  endocardial borders. The left ventricular internal cavity size was normal in size. There is moderate concentric left ventricular hypertrophy. Left ventricular diastolic parameters are consistent with Grade I diastolic dysfunction (impaired relaxation). Right Ventricle: The right ventricular size is normal. No increase in right ventricular wall thickness. Right ventricular systolic function is normal. Left Atrium: Left atrial size was normal in size. Right Atrium: Right atrial size was normal in size. Pericardium: There is no evidence of pericardial effusion. Mitral Valve: The mitral valve  is normal in structure. Trivial mitral valve regurgitation. No evidence of mitral valve stenosis. Tricuspid Valve: The tricuspid valve is normal in structure. Tricuspid valve regurgitation is trivial. No evidence of tricuspid stenosis. Aortic Valve: The aortic valve is normal in structure. Aortic valve regurgitation is not visualized. No aortic stenosis is present. Pulmonic Valve: The pulmonic valve was normal in structure. Pulmonic valve regurgitation is trivial. No evidence of pulmonic stenosis. Aorta: The aortic root is normal in size and structure. There is borderline dilatation of the aortic root, measuring 38 mm. There is borderline dilatation of the ascending aorta, measuring 38 mm. Venous: The inferior vena cava is normal in size with greater than 50% respiratory variability, suggesting right atrial pressure of 3 mmHg. IAS/Shunts: No atrial level shunt detected by color flow Doppler.  LEFT VENTRICLE PLAX 2D LVIDd:         5.10 cm      Diastology LVIDs:         4.80 cm      LV e' medial:    6.09 cm/s LV PW:         1.30 cm      LV E/e' medial:  16.7 LV IVS:        1.30 cm      LV e' lateral:   11.60 cm/s LVOT diam:     2.40 cm      LV E/e' lateral: 8.8 LV SV:         74 LV SV Index:   33 LVOT Area:     4.52 cm  LV Volumes (MOD) LV vol d, MOD A2C: 150.0 ml LV vol d, MOD A4C: 202.0 ml LV vol s, MOD A2C: 82.9 ml LV vol s, MOD A4C: 78.9 ml LV SV MOD A2C:     67.1 ml LV SV MOD A4C:     202.0 ml LV SV MOD BP:      96.7 ml RIGHT VENTRICLE         IVC TAPSE (M-mode): 2.0 cm  IVC diam: 2.20 cm LEFT ATRIUM             Index        RIGHT ATRIUM           Index LA diam:        3.70 cm 1.66 cm/m   RA Area:     24.70 cm LA Vol (A2C):   48.2 ml 21.63 ml/m  RA Volume:   85.10 ml  38.18 ml/m LA Vol (A4C):   50.0 ml 22.43 ml/m LA Biplane Vol: 49.4 ml 22.17 ml/m  AORTIC VALVE LVOT Vmax:   87.60 cm/s LVOT Vmean:  62.800 cm/s LVOT VTI:    0.163 m  AORTA Ao Root diam: 3.80 cm Ao Asc diam:  3.80 cm MITRAL VALVE MV Area  (PHT): 4.99 cm     SHUNTS MV Decel Time: 152 msec     Systemic VTI:  0.16 m MV  E velocity: 102.00 cm/s  Systemic Diam: 2.40 cm MV A velocity: 92.80 cm/s MV E/A ratio:  1.10 Jules Oar MD Electronically signed by Jules Oar MD Signature Date/Time: 05/22/2024/11:29:31 AM    Final            LOS: 2 days   Time spent= 35 mins    Maggie Schooner, MD [facility]  If 7PM-7AM, please contact night-coverage  05/24/2024, 8:58 AM

## 2024-05-25 ENCOUNTER — Encounter (HOSPITAL_COMMUNITY): Payer: Self-pay | Admitting: Internal Medicine

## 2024-05-25 DIAGNOSIS — L03115 Cellulitis of right lower limb: Secondary | ICD-10-CM | POA: Diagnosis not present

## 2024-05-25 LAB — BASIC METABOLIC PANEL WITH GFR
Anion gap: 5 (ref 5–15)
BUN: 23 mg/dL (ref 8–23)
CO2: 21 mmol/L — ABNORMAL LOW (ref 22–32)
Calcium: 7.6 mg/dL — ABNORMAL LOW (ref 8.9–10.3)
Chloride: 106 mmol/L (ref 98–111)
Creatinine, Ser: 2.25 mg/dL — ABNORMAL HIGH (ref 0.61–1.24)
GFR, Estimated: 32 mL/min — ABNORMAL LOW (ref >60)
Glucose, Bld: 241 mg/dL — ABNORMAL HIGH (ref 70–99)
Potassium: 4.2 mmol/L (ref 3.5–5.1)
Sodium: 132 mmol/L — ABNORMAL LOW (ref 135–145)

## 2024-05-25 LAB — CBC
HCT: 36.7 % — ABNORMAL LOW (ref 39.0–52.0)
Hemoglobin: 11.9 g/dL — ABNORMAL LOW (ref 13.0–17.0)
MCH: 28.5 pg (ref 26.0–34.0)
MCHC: 32.4 g/dL (ref 30.0–36.0)
MCV: 88 fL (ref 80.0–100.0)
Platelets: 205 10*3/uL (ref 150–400)
RBC: 4.17 MIL/uL — ABNORMAL LOW (ref 4.22–5.81)
RDW: 13.4 % (ref 11.5–15.5)
WBC: 8.4 10*3/uL (ref 4.0–10.5)
nRBC: 0 % (ref 0.0–0.2)

## 2024-05-25 LAB — GLUCOSE, CAPILLARY
Glucose-Capillary: 292 mg/dL — ABNORMAL HIGH (ref 70–99)
Glucose-Capillary: 304 mg/dL — ABNORMAL HIGH (ref 70–99)
Glucose-Capillary: 276 mg/dL — ABNORMAL HIGH (ref 70–99)
Glucose-Capillary: 294 mg/dL — ABNORMAL HIGH (ref 70–99)

## 2024-05-25 LAB — BRAIN NATRIURETIC PEPTIDE: B Natriuretic Peptide: 68.1 pg/mL (ref 0.0–100.0)

## 2024-05-25 LAB — MAGNESIUM: Magnesium: 1.9 mg/dL (ref 1.7–2.4)

## 2024-05-25 MED ORDER — FUROSEMIDE 10 MG/ML IJ SOLN
80.0000 mg | Freq: Once | INTRAMUSCULAR | Status: AC
  Administered 2024-05-25: 80 mg via INTRAVENOUS
  Filled 2024-05-25: qty 8

## 2024-05-25 NOTE — Plan of Care (Signed)
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
## 2024-05-25 NOTE — Progress Notes (Signed)
 PROGRESS NOTE    Samuel Bailey  NFA:[id number] DOB: [birth year] DOA: 05/21/2024 PCP: Alonso Jan., MD    Brief Narrative:   63 year old with history of DM2, COPD, HTN, HLD, CKD 3A, admitted to [facility] for infected right foot wound with cellulitis in the setting of diabetes.  This foot wound occurred about 2 weeks ago and has been worsening since then.  X-ray of the foot was suggestive of cellulitis, MRI also suggestive of cellulitis and no evidence of osteomyelitis.  Initially started on broad-spectrum antibiotics eventually transition to p.o. linezolid  to complete total 7-day course.  Case was discussed with ID about this as well.  Due to rising creatinine, nephrology consulted  Assessment & Plan:  Principal Problem:   Cellulitis of right lower extremity Active Problems:   DM2 (diabetes mellitus, type 2) (HCC)   Essential hypertension   HLD (hyperlipidemia)   Elevated troponin   Thoracic aortic aneurysm (TAA) (HCC)   History of COPD   CKD stage 3a, GFR 45-59 ml/min (HCC)   Right foot infected diabetic foot wound with surrounding cellulitis - Started about 2 weeks ago, x-ray shows cellulitic changes. Which was confirmed on MRI foot, no evidece of osteo.  Wound cultures are growing MSSA. - Broad-spectrum antibiotics: Vancomycin  Rocephin  and Flagyl  > to Linezolid  (discussed with ID).  EOT 6/9 (Photo under media section)  Elevated troponin - No chest pain, EKG is normal.  Really insignificant elevation at this time. -Echo shows EF of 45%, grade 1 DD.   AKI on CKD stage IIIa - Creatinine at baseline 1.5, creatinine trending up, today 2.25 - Renal ultrasound negative. Will have Renal evaluate him.   Peripheral interstitial/groundglass opacity in the lungs - Seen on the CTA chest.  No obvious evidence of pulmonary infection.  As needed bronchodilators.  Currently on room air.  Diabetes mellitus type 2, insulin -dependent Peripheral neuropathy - A1c 12.6.  Adjust insulin  due to  uncontrolled blood sugars - Continue gabapentin   History of COPD - Bronchodilators  Hyperlipidemia - Statin  Essential hypertension - On aspirin  and statin - Toprol -XL.  IV as needed   Thoracic aortic aneurysm, 4 cm - Known history of this.  Follow-up outpatient PCP   DVT prophylaxis: SCDs Start: 05/21/24 1950    Code Status: Full Code Family Communication:   Continue hospital stay until renal function improves.      Subjective: Seen at bedside Having some cough   Examination:  General exam: Appears calm and comfortable  Respiratory system: Bilateral rhonchi Cardiovascular system: S1 & S2 heard, RRR. No JVD, murmurs, rubs, gallops or clicks. No pedal edema. Gastrointestinal system: Abdomen is nondistended, soft and nontender. No organomegaly or masses felt. Normal bowel sounds heard. Central nervous system: Alert and oriented. No focal neurological deficits. Extremities: Left-sided AKA.  Right foot puncture wound on plantar surface.  Surrounding erythema and warmth Skin: No rashes, lesions or ulcers Psychiatry: Judgement and insight appear normal. Mood & affect appropriate.                Diet Orders (From admission, onward)     Start     Ordered   05/21/24 1950  Diet regular Room service appropriate? Yes; Fluid consistency: Thin  Diet effective now       Question Answer Comment  Room service appropriate? Yes   Fluid consistency: Thin      05/21/24 1950            Objective: Vitals:   05/24/24 1932 05/25/24 4696 05/25/24 2952  05/25/24 0834  BP: 127/70 (!) 150/72 (!) 147/70   Pulse: 82 81 85 85  Resp: 18 18 19 18   Temp: 98 F (36.7 C) (!) 97.4 F (36.3 C) 98 F (36.7 C)   TempSrc:      SpO2: 97% 97% 98%   Weight:  119.2 kg    Height:        Intake/Output Summary (Last 24 hours) at 05/25/2024 1000 Last data filed at 05/25/2024 0435 Gross per 24 hour  Intake 177.1 ml  Output 520 ml  Net -342.9 ml   Filed Weights   05/23/24 0500  05/24/24 0500 05/25/24 0416  Weight: 117.4 kg 119 kg 119.2 kg    Scheduled Meds:  aspirin  EC  81 mg Oral Daily   atorvastatin   40 mg Oral Daily   enoxaparin  (LOVENOX ) injection  55 mg Subcutaneous Q24H   gabapentin   300 mg Oral TID   insulin  aspart  0-15 Units Subcutaneous TID WC   insulin  aspart  0-5 Units Subcutaneous QHS   insulin  aspart  6 Units Subcutaneous TID WC   insulin  glargine-yfgn  37 Units Subcutaneous BID   linezolid   600 mg Oral Q12H   metoprolol  succinate  12.5 mg Oral Daily   pantoprazole   40 mg Oral BID   umeclidinium bromide   1 puff Inhalation Daily   Continuous Infusions:  Nutritional status     Body mass index is 40.07 kg/m.  Data Reviewed:   CBC: Recent Labs  Lab 05/21/24 1226 05/22/24 0549 05/23/24 0640 05/24/24 0301 05/25/24 0321  WBC 7.6 7.1 7.4 10.4 8.4  NEUTROABS  --  4.2  --   --   --   HGB 12.6* 12.0* 11.9* 12.3* 11.9*  HCT 38.9* 36.9* 36.1* 37.4* 36.7*  MCV 87.4 86.8 87.6 87.4 88.0  PLT 235 209 201 228 205   Basic Metabolic Panel: Recent Labs  Lab 05/21/24 1226 05/21/24 1515 05/22/24 0549 05/23/24 0640 05/23/24 1248 05/24/24 0301 05/25/24 0321  NA 137  --  135 135  --  135 132*  K 4.6  --  3.9 4.1  --  4.4 4.2  CL 106  --  105 106  --  104 106  CO2 21*  --  22 21*  --  22 21*  GLUCOSE 240*  --  199* 261* 377* 198* 241*  BUN 22  --  22 22  --  23 23  CREATININE 1.56*  --  1.68* 2.03*  --  2.18* 2.25*  CALCIUM  8.6*  --  8.5* 8.5*  --  8.4* 7.6*  MG  --  1.7 1.7 1.9  --  2.0 1.9  PHOS  --   --  4.6 4.6  --   --   --    GFR: Estimated Creatinine Clearance: 42.7 mL/min (A) (by C-G formula based on SCr of 2.25 mg/dL (H)). Liver Function Tests: Recent Labs  Lab 05/22/24 0549  AST 8*  ALT 10  ALKPHOS 60  BILITOT 0.4  PROT 5.4*  ALBUMIN 2.1*   No results for input(s): "LIPASE", "AMYLASE" in the last 168 hours. No results for input(s): "AMMONIA" in the last 168 hours. Coagulation Profile: No results for input(s):  "INR", "PROTIME" in the last 168 hours. Cardiac Enzymes: No results for input(s): "CKTOTAL", "CKMB", "CKMBINDEX", "TROPONINI" in the last 168 hours. BNP (last 3 results) No results for input(s): "PROBNP" in the last 8760 hours. HbA1C: No results for input(s): "HGBA1C" in the last 72 hours. CBG: Recent Labs  Lab  05/24/24 0842 05/24/24 1155 05/24/24 1619 05/24/24 2038 05/25/24 0820  GLUCAP 271* 316* 170* 191* 292*   Lipid Profile: No results for input(s): "CHOL", "HDL", "LDLCALC", "TRIG", "CHOLHDL", "LDLDIRECT" in the last 72 hours. Thyroid Function Tests: No results for input(s): "TSH", "T4TOTAL", "FREET4", "T3FREE", "THYROIDAB" in the last 72 hours. Anemia Panel: No results for input(s): "VITAMINB12", "FOLATE", "FERRITIN", "TIBC", "IRON", "RETICCTPCT" in the last 72 hours. Sepsis Labs: Recent Labs  Lab 05/21/24 1515  PROCALCITON <0.10    Recent Results (from the past 240 hours)  Resp panel by RT-PCR (RSV, Flu A&B, Covid) Anterior Nasal Swab     Status: None   Collection Time: 05/21/24  2:50 PM   Specimen: Anterior Nasal Swab  Result Value Ref Range Status   SARS Coronavirus 2 by RT PCR NEGATIVE NEGATIVE Final   Influenza A by PCR NEGATIVE NEGATIVE Final   Influenza B by PCR NEGATIVE NEGATIVE Final    Comment: (NOTE) The Xpert Xpress SARS-CoV-2/FLU/RSV plus assay is intended as an aid in the diagnosis of influenza from Nasopharyngeal swab specimens and should not be used as a sole basis for treatment. Nasal washings and aspirates are unacceptable for Xpert Xpress SARS-CoV-2/FLU/RSV testing.  Fact Sheet for Patients: BloggerCourse.com  Fact Sheet for Healthcare Providers: SeriousBroker.it  This test is not yet approved or cleared by the [place]  FDA and has been authorized for detection and/or diagnosis of SARS-CoV-2 by FDA under an Emergency Use Authorization (EUA). This EUA will remain in effect (meaning this  test can be used) for the duration of the COVID-19 declaration under Section 564(b)(1) of the Act, 21 U.S.C. section 360bbb-3(b)(1), unless the authorization is terminated or revoked.     Resp Syncytial Virus by PCR NEGATIVE NEGATIVE Final    Comment: (NOTE) Fact Sheet for Patients: BloggerCourse.com  Fact Sheet for Healthcare Providers: SeriousBroker.it  This test is not yet approved or cleared by the [place]  FDA and has been authorized for detection and/or diagnosis of SARS-CoV-2 by FDA under an Emergency Use Authorization (EUA). This EUA will remain in effect (meaning this test can be used) for the duration of the COVID-19 declaration under Section 564(b)(1) of the Act, 21 U.S.C. section 360bbb-3(b)(1), unless the authorization is terminated or revoked.  Performed at [facility], [address], [place], [place] [address]          Radiology Studies: US  RENAL Result Date: 05/24/2024 CLINICAL DATA:  [id number] AKI (acute kidney injury) (HCC) [id number] EXAM: RENAL / URINARY TRACT ULTRASOUND COMPLETE COMPARISON:  None Available. FINDINGS: Right Kidney: Renal measurements: 11.4 x 5.7 x 7.1 cm = volume: 243 mL. Normal echogenicity. No mass. No hydronephrosis or nephrolithiasis. Left Kidney: Renal measurements: 13.4 x 5.9 x 6 cm = volume: 249 mL. Normal echogenicity. No mass. No hydronephrosis or nephrolithiasis. Bladder: Appears normal for degree of bladder distention. Other: Mild hepatic steatosis. IMPRESSION: 1. No hydronephrosis or nephrolithiasis. 2. Mild hepatic steatosis. Electronically Signed   By: Rance Burrows M.D.   On: 05/24/2024 08:14           LOS: 3 days   Time spent= 35 mins    Maggie Schooner, MD [facility]  If 7PM-7AM, please contact night-coverage  05/25/2024, 10:00 AM

## 2024-05-26 DIAGNOSIS — L03115 Cellulitis of right lower limb: Secondary | ICD-10-CM | POA: Diagnosis not present

## 2024-05-26 LAB — BASIC METABOLIC PANEL WITH GFR
Anion gap: 9 (ref 5–15)
BUN: 26 mg/dL — ABNORMAL HIGH (ref 8–23)
CO2: 22 mmol/L (ref 22–32)
Calcium: 8.2 mg/dL — ABNORMAL LOW (ref 8.9–10.3)
Chloride: 101 mmol/L (ref 98–111)
Creatinine, Ser: 2.22 mg/dL — ABNORMAL HIGH (ref 0.61–1.24)
GFR, Estimated: 33 mL/min — ABNORMAL LOW (ref >60)
Glucose, Bld: 201 mg/dL — ABNORMAL HIGH (ref 70–99)
Potassium: 4.9 mmol/L (ref 3.5–5.1)
Sodium: 132 mmol/L — ABNORMAL LOW (ref 135–145)

## 2024-05-26 LAB — GLUCOSE, CAPILLARY
Glucose-Capillary: 206 mg/dL — ABNORMAL HIGH (ref 70–99)
Glucose-Capillary: 222 mg/dL — ABNORMAL HIGH (ref 70–99)
Glucose-Capillary: 219 mg/dL — ABNORMAL HIGH (ref 70–99)
Glucose-Capillary: 247 mg/dL — ABNORMAL HIGH (ref 70–99)

## 2024-05-26 LAB — MAGNESIUM: Magnesium: 1.7 mg/dL (ref 1.7–2.4)

## 2024-05-26 LAB — CBC
HCT: 38.7 % — ABNORMAL LOW (ref 39.0–52.0)
Hemoglobin: 12.9 g/dL — ABNORMAL LOW (ref 13.0–17.0)
MCH: 28.9 pg (ref 26.0–34.0)
MCHC: 33.3 g/dL (ref 30.0–36.0)
MCV: 86.6 fL (ref 80.0–100.0)
Platelets: 241 10*3/uL (ref 150–400)
RBC: 4.47 MIL/uL (ref 4.22–5.81)
RDW: 13.4 % (ref 11.5–15.5)
WBC: 8.4 10*3/uL (ref 4.0–10.5)
nRBC: 0 % (ref 0.0–0.2)

## 2024-05-26 MED ORDER — FUROSEMIDE 10 MG/ML IJ SOLN
120.0000 mg | Freq: Two times a day (BID) | INTRAVENOUS | Status: DC
  Administered 2024-05-26 – 2024-05-28 (×4): 120 mg via INTRAVENOUS
  Filled 2024-05-26 (×2): qty 12
  Filled 2024-05-26: qty 10
  Filled 2024-05-26: qty 120
  Filled 2024-05-26: qty 12
  Filled 2024-05-26: qty 10

## 2024-05-26 MED ORDER — INSULIN GLARGINE-YFGN 100 UNIT/ML ~~LOC~~ SOLN
45.0000 [IU] | Freq: Two times a day (BID) | SUBCUTANEOUS | Status: DC
  Administered 2024-05-26 – 2024-05-27 (×2): 45 [IU] via SUBCUTANEOUS
  Filled 2024-05-26 (×3): qty 0.45

## 2024-05-26 NOTE — Plan of Care (Signed)
  Problem: Education: Goal: Ability to describe self-care measures that may prevent or decrease complications (Diabetes Survival Skills Education) will improve Outcome: Progressing Goal: Individualized Educational Video(s) Outcome: Progressing   Problem: Coping: Goal: Ability to adjust to condition or change in health will improve Outcome: Progressing   Problem: Fluid Volume: Goal: Ability to maintain a balanced intake and output will improve Outcome: Progressing   Problem: Health Behavior/Discharge Planning: Goal: Ability to identify and utilize available resources and services will improve Outcome: Progressing Goal: Ability to manage health-related needs will improve Outcome: Progressing   Problem: Metabolic: Goal: Ability to maintain appropriate glucose levels will improve Outcome: Progressing   Problem: Nutritional: Goal: Maintenance of adequate nutrition will improve Outcome: Progressing Goal: Progress toward achieving an optimal weight will improve Outcome: Progressing   Problem: Skin Integrity: Goal: Risk for impaired skin integrity will decrease Outcome: Progressing   Problem: Tissue Perfusion: Goal: Adequacy of tissue perfusion will improve Outcome: Progressing   Problem: Education: Goal: Knowledge of General Education information will improve Description: Including pain rating scale, medication(s)/side effects and non-pharmacologic comfort measures Outcome: Progressing   Problem: Health Behavior/Discharge Planning: Goal: Ability to manage health-related needs will improve Outcome: Progressing   Problem: Clinical Measurements: Goal: Ability to maintain clinical measurements within normal limits will improve Outcome: Progressing Goal: Will remain free from infection Outcome: Progressing Goal: Diagnostic test results will improve Outcome: Progressing Goal: Respiratory complications will improve Outcome: Progressing Goal: Cardiovascular complication will  be avoided Outcome: Progressing   Problem: Coping: Goal: Level of anxiety will decrease Outcome: Progressing   Problem: Nutrition: Goal: Adequate nutrition will be maintained Outcome: Progressing   Problem: Elimination: Goal: Will not experience complications related to bowel motility Outcome: Progressing Goal: Will not experience complications related to urinary retention Outcome: Progressing   Problem: Pain Managment: Goal: General experience of comfort will improve and/or be controlled Outcome: Progressing   Problem: Safety: Goal: Ability to remain free from injury will improve Outcome: Progressing   Problem: Skin Integrity: Goal: Risk for impaired skin integrity will decrease Outcome: Progressing

## 2024-05-26 NOTE — Progress Notes (Signed)
 PROGRESS NOTE    Samuel Bailey  ZOX:[id number] DOB: 14-Sep-1961 DOA: 05/21/2024 PCP: Alonso Jan., MD    Brief Narrative:   63 year old with history of DM2, COPD, HTN, HLD, CKD 3A, admitted to [facility] for infected right foot wound with cellulitis in the setting of diabetes.  This foot wound occurred about 2 weeks ago and has been worsening since then.  X-ray of the foot was suggestive of cellulitis, MRI also suggestive of cellulitis and no evidence of osteomyelitis.  Initially started on broad-spectrum antibiotics eventually transition to p.o. linezolid  to complete total 7-day course.  Case was discussed with ID about this as well.  Due to rising creatinine, nephrology consulted  Assessment & Plan:  Principal Problem:   Cellulitis of right lower extremity Active Problems:   DM2 (diabetes mellitus, type 2) (HCC)   Essential hypertension   HLD (hyperlipidemia)   Elevated troponin   Thoracic aortic aneurysm (TAA) (HCC)   History of COPD   CKD stage 3a, GFR 45-59 ml/min (HCC)   Right foot infected diabetic foot wound with surrounding cellulitis - Started about 2 weeks ago, x-ray shows cellulitic changes. Which was confirmed on MRI foot, no evidece of osteo.  Wound cultures are growing MSSA. - Broad-spectrum antibiotics: Vancomycin  Rocephin  and Flagyl  > to Linezolid  (discussed with ID).  EOT 6/9 (Photo under media section)    AKI on CKD stage IIIa - Creatinine at baseline 1.5, creatinine trending up, renal ultrasound negative.  Creatinine pending today.  Contrast-induced versus volume overload?  Received Lasix  80 mg IV once  Peripheral interstitial/groundglass opacity in the lungs - Seen on the CTA chest.  No obvious evidence of pulmonary infection.  As needed bronchodilators.  Currently on room air.  Status post Lasix  80 mg once, will give Lasix  120 mg twice daily   Elevated troponin - No chest pain, EKG is normal.  Really insignificant elevation at this time. -Echo shows EF of  45%, grade 1 DD.  Diabetes mellitus type 2, insulin -dependent Peripheral neuropathy - A1c 12.6.  Adjust insulin  due to uncontrolled blood sugars - Continue gabapentin   History of COPD - Bronchodilators  Hyperlipidemia - Statin  Essential hypertension - On aspirin  and statin - Toprol -XL.  IV as needed   Thoracic aortic aneurysm, 4 cm - Known history of this.  Follow-up outpatient PCP   DVT prophylaxis: SCDs Start: 05/21/24 1950    Code Status: Full Code Family Communication:   Continue hospital stay until renal function improves.      Subjective: Urinated more than usual yesterday after getting Lasix . No new complaints.  DOE little better Examination:  General exam: Appears calm and comfortable  Respiratory system: Bilateral rhonchi, improved Cardiovascular system: S1 & S2 heard, RRR. No JVD, murmurs, rubs, gallops or clicks. No pedal edema. Gastrointestinal system: Abdomen is nondistended, soft and nontender. No organomegaly or masses felt. Normal bowel sounds heard. Central nervous system: Alert and oriented. No focal neurological deficits. Extremities: Left-sided AKA.  Right foot puncture wound on plantar surface.  Surrounding erythema and warmth Skin: No rashes, lesions or ulcers Psychiatry: Judgement and insight appear normal. Mood & affect appropriate.                Diet Orders (From admission, onward)     Start     Ordered   05/21/24 1950  Diet regular Room service appropriate? Yes; Fluid consistency: Thin  Diet effective now       Question Answer Comment  Room service appropriate? Yes  Fluid consistency: Thin      05/21/24 1950            Objective: Vitals:   05/25/24 2017 05/26/24 0417 05/26/24 0734 05/26/24 0755  BP: 130/63 (!) 149/86  (!) 143/125  Pulse: 86 87 81 88  Resp: 20 19 20 20   Temp: 97.9 F (36.6 C) 98.4 F (36.9 C)  98.2 F (36.8 C)  TempSrc:  Oral    SpO2: 97% 99% 99% 100%  Weight:  118.5 kg    Height:         Intake/Output Summary (Last 24 hours) at 05/26/2024 1018 Last data filed at 05/26/2024 1000 Gross per 24 hour  Intake 500 ml  Output 1640 ml  Net -1140 ml   Filed Weights   05/24/24 0500 05/25/24 0416 05/26/24 0417  Weight: 119 kg 119.2 kg 118.5 kg    Scheduled Meds:  aspirin  EC  81 mg Oral Daily   atorvastatin   40 mg Oral Daily   enoxaparin  (LOVENOX ) injection  55 mg Subcutaneous Q24H   gabapentin   300 mg Oral TID   insulin  aspart  0-15 Units Subcutaneous TID WC   insulin  aspart  0-5 Units Subcutaneous QHS   insulin  aspart  6 Units Subcutaneous TID WC   insulin  glargine-yfgn  37 Units Subcutaneous BID   linezolid   600 mg Oral Q12H   metoprolol  succinate  12.5 mg Oral Daily   pantoprazole   40 mg Oral BID   umeclidinium bromide   1 puff Inhalation Daily   Continuous Infusions:  furosemide       Nutritional status     Body mass index is 39.84 kg/m.  Data Reviewed:   CBC: Recent Labs  Lab 05/22/24 0549 05/23/24 0640 05/24/24 0301 05/25/24 0321 05/26/24 0818  WBC 7.1 7.4 10.4 8.4 8.4  NEUTROABS 4.2  --   --   --   --   HGB 12.0* 11.9* 12.3* 11.9* 12.9*  HCT 36.9* 36.1* 37.4* 36.7* 38.7*  MCV 86.8 87.6 87.4 88.0 86.6  PLT 209 201 228 205 241   Basic Metabolic Panel: Recent Labs  Lab 05/22/24 0549 05/23/24 0640 05/23/24 1248 05/24/24 0301 05/25/24 0321 05/26/24 0818  NA 135 135  --  135 132* 132*  K 3.9 4.1  --  4.4 4.2 4.9  CL 105 106  --  104 106 101  CO2 22 21*  --  22 21* 22  GLUCOSE 199* 261* 377* 198* 241* 201*  BUN 22 22  --  23 23 26*  CREATININE 1.68* 2.03*  --  2.18* 2.25* 2.22*  CALCIUM  8.5* 8.5*  --  8.4* 7.6* 8.2*  MG 1.7 1.9  --  2.0 1.9 1.7  PHOS 4.6 4.6  --   --   --   --    GFR: Estimated Creatinine Clearance: 43.1 mL/min (A) (by C-G formula based on SCr of 2.22 mg/dL (H)). Liver Function Tests: Recent Labs  Lab 05/22/24 0549  AST 8*  ALT 10  ALKPHOS 60  BILITOT 0.4  PROT 5.4*  ALBUMIN 2.1*   No results for input(s):  "LIPASE", "AMYLASE" in the last 168 hours. No results for input(s): "AMMONIA" in the last 168 hours. Coagulation Profile: No results for input(s): "INR", "PROTIME" in the last 168 hours. Cardiac Enzymes: No results for input(s): "CKTOTAL", "CKMB", "CKMBINDEX", "TROPONINI" in the last 168 hours. BNP (last 3 results) No results for input(s): "PROBNP" in the last 8760 hours. HbA1C: No results for input(s): "HGBA1C" in the last 72 hours. CBG:  Recent Labs  Lab 05/25/24 0820 05/25/24 1259 05/25/24 1625 05/25/24 2018 05/26/24 0742  GLUCAP 292* 304* 276* 294* 206*   Lipid Profile: No results for input(s): "CHOL", "HDL", "LDLCALC", "TRIG", "CHOLHDL", "LDLDIRECT" in the last 72 hours. Thyroid Function Tests: No results for input(s): "TSH", "T4TOTAL", "FREET4", "T3FREE", "THYROIDAB" in the last 72 hours. Anemia Panel: No results for input(s): "VITAMINB12", "FOLATE", "FERRITIN", "TIBC", "IRON", "RETICCTPCT" in the last 72 hours. Sepsis Labs: Recent Labs  Lab 05/21/24 1515  PROCALCITON <0.10    Recent Results (from the past 240 hours)  Resp panel by RT-PCR (RSV, Flu A&B, Covid) Anterior Nasal Swab     Status: None   Collection Time: 05/21/24  2:50 PM   Specimen: Anterior Nasal Swab  Result Value Ref Range Status   SARS Coronavirus 2 by RT PCR NEGATIVE NEGATIVE Final   Influenza A by PCR NEGATIVE NEGATIVE Final   Influenza B by PCR NEGATIVE NEGATIVE Final    Comment: (NOTE) The Xpert Xpress SARS-CoV-2/FLU/RSV plus assay is intended as an aid in the diagnosis of influenza from Nasopharyngeal swab specimens and should not be used as a sole basis for treatment. Nasal washings and aspirates are unacceptable for Xpert Xpress SARS-CoV-2/FLU/RSV testing.  Fact Sheet for Patients: BloggerCourse.com  Fact Sheet for Healthcare Providers: SeriousBroker.it  This test is not yet approved or cleared by the [place]  FDA and has been  authorized for detection and/or diagnosis of SARS-CoV-2 by FDA under an Emergency Use Authorization (EUA). This EUA will remain in effect (meaning this test can be used) for the duration of the COVID-19 declaration under Section 564(b)(1) of the Act, 21 U.S.C. section 360bbb-3(b)(1), unless the authorization is terminated or revoked.     Resp Syncytial Virus by PCR NEGATIVE NEGATIVE Final    Comment: (NOTE) Fact Sheet for Patients: BloggerCourse.com  Fact Sheet for Healthcare Providers: SeriousBroker.it  This test is not yet approved or cleared by the [place]  FDA and has been authorized for detection and/or diagnosis of SARS-CoV-2 by FDA under an Emergency Use Authorization (EUA). This EUA will remain in effect (meaning this test can be used) for the duration of the COVID-19 declaration under Section 564(b)(1) of the Act, 21 U.S.C. section 360bbb-3(b)(1), unless the authorization is terminated or revoked.  Performed at [facility], [address] [place], [place] [address]          Radiology Studies: No results found.         LOS: 4 days   Time spent= 35 mins    Maggie Schooner, MD [facility]  If 7PM-7AM, please contact night-coverage  05/26/2024, 10:18 AM

## 2024-05-26 NOTE — Progress Notes (Signed)
 [facility] Progress Note  Subjective:  Gave 80 mg IV lasix  x 1 yesterday Says he didn't pee "any more than usual" Creat stable low 2's  Vitals:   05/25/24 2017 05/26/24 0417 05/26/24 0734 05/26/24 0755  BP: 130/63 (!) 149/86  (!) 143/125  Pulse: 86 87 81 88  Resp: 20 19 20 20   Temp: 97.9 F (36.6 C) 98.4 F (36.9 C)  98.2 F (36.8 C)  TempSrc:  Oral    SpO2: 97% 99% 99% 100%  Weight:  118.5 kg    Height:        Exam: Gen alert, no distress, on RA No rash, cyanosis or gangrene Sclera anicteric, throat clear  No jvd or bruits Chest clear bilat to bases, no rales/ wheezing RRR no MRG Abd soft ntnd no mass or ascites +bs MS L AKA Ext 2+ diffuse RLE edema Neuro is alert, Ox 3 , nf        Renal-related home meds: Toprol  xl 12.5 daily Others: zofran , Spiriva , PPI, insulin  x 2, gabapentin , statin, asa   Date                             Creat               eGFR (ml/min) 2020- 2022                  0.64- 0.85 2023                            1.02                 > 60  5/31- 05/25/23               1.68 >> 1.35    46- 60 ml/min   05/21/24                        1.56                 50 ml/min         6/04                             1.68 6/05                             2.03 6/06                             2.18                              05/25/24                        2.25     UA 05/21/24 ->  glu > 500, small Hb, prot > 300, 0-5 rbc/wbc/epi UA 05/19/23 -> glu > 500, ket 5, prot > 300, 0-5 epi/wbc/rbc Renal US  05/24/24 --> 11.4/ 13.4 cm kidneys w/ no hydro and normal echotexture CXR 6/03 - no active disease CTA chest 6/03: IMPRESSION: Peripheral interstitial and ground-glass opacities in the bilateral lower lobes, nonspecific. Findings may be infectious/inflammatory BP here 164/92 on admission, no hypotension episodes <  120 while here     Assessment/ Plan: AKI on CKD 3a: b/l creatinine 1.3- 1.5 from mid 2024 and 05/21/24, eGFR 50- 60 ml/min. Creat here was  1.5 on admission, and has been rising daily since up to low 2's. Pt rec'd IV contrast w/ CTA chest on 6/03, no other nephrotoxins, no hypotension. Renal US  w/o obstruction. UA +protein, no rbcs/ wbcs. Pt has RLE edema, and c/o orthopnea, can't lie down. CXR w/o edema. CKD likely due to DKD, and AKI likely is contrast injury +/- vol overload. Gave IV lasix  80mg  x 1 yesterday, not responding. D/w pmd, will ^ IV lasix . Will follow.  R foot infection/ cellulitis: no osteo per MRI, per pmd DM type 2: long-standing            Rob Zana Hesselbach MD  [facility] 05/26/2024, 10:25 AM  Recent Labs  Lab 05/22/24 0549 05/23/24 0640 05/24/24 0301 05/25/24 0321 05/26/24 0818  HGB 12.0* 11.9*   < > 11.9* 12.9*  ALBUMIN 2.1*  --   --   --   --   CALCIUM  8.5* 8.5*   < > 7.6* 8.2*  PHOS 4.6 4.6  --   --   --   CREATININE 1.68* 2.03*   < > 2.25* 2.22*  K 3.9 4.1   < > 4.2 4.9   < > = values in this interval not displayed.   No results for input(s): "IRON", "TIBC", "FERRITIN" in the last 168 hours. Inpatient medications:  aspirin  EC  81 mg Oral Daily   atorvastatin   40 mg Oral Daily   enoxaparin  (LOVENOX ) injection  55 mg Subcutaneous Q24H   gabapentin   300 mg Oral TID   insulin  aspart  0-15 Units Subcutaneous TID WC   insulin  aspart  0-5 Units Subcutaneous QHS   insulin  aspart  6 Units Subcutaneous TID WC   insulin  glargine-yfgn  37 Units Subcutaneous BID   linezolid   600 mg Oral Q12H   metoprolol  succinate  12.5 mg Oral Daily   pantoprazole   40 mg Oral BID   umeclidinium bromide   1 puff Inhalation Daily    furosemide      acetaminophen  **OR** acetaminophen , fentaNYL  (SUBLIMAZE ) injection, glucagon  (human recombinant), guaiFENesin , hydrALAZINE , ipratropium-albuterol , labetalol , melatonin, metoprolol  tartrate, naLOXone  (NARCAN )  injection, ondansetron  (ZOFRAN ) IV, senna-docusate, traZODone 

## 2024-05-27 DIAGNOSIS — L03115 Cellulitis of right lower limb: Secondary | ICD-10-CM | POA: Diagnosis not present

## 2024-05-27 LAB — BASIC METABOLIC PANEL WITH GFR
Anion gap: 12 (ref 5–15)
BUN: 28 mg/dL — ABNORMAL HIGH (ref 8–23)
CO2: 23 mmol/L (ref 22–32)
Calcium: 8.6 mg/dL — ABNORMAL LOW (ref 8.9–10.3)
Chloride: 98 mmol/L (ref 98–111)
Creatinine, Ser: 2.27 mg/dL — ABNORMAL HIGH (ref 0.61–1.24)
GFR, Estimated: 32 mL/min — ABNORMAL LOW (ref >60)
Glucose, Bld: 215 mg/dL — ABNORMAL HIGH (ref 70–99)
Potassium: 4.2 mmol/L (ref 3.5–5.1)
Sodium: 133 mmol/L — ABNORMAL LOW (ref 135–145)

## 2024-05-27 LAB — GLUCOSE, CAPILLARY
Glucose-Capillary: 226 mg/dL — ABNORMAL HIGH (ref 70–99)
Glucose-Capillary: 307 mg/dL — ABNORMAL HIGH (ref 70–99)
Glucose-Capillary: 228 mg/dL — ABNORMAL HIGH (ref 70–99)
Glucose-Capillary: 306 mg/dL — ABNORMAL HIGH (ref 70–99)

## 2024-05-27 LAB — CBC
HCT: 38.2 % — ABNORMAL LOW (ref 39.0–52.0)
Hemoglobin: 12.5 g/dL — ABNORMAL LOW (ref 13.0–17.0)
MCH: 28.5 pg (ref 26.0–34.0)
MCHC: 32.7 g/dL (ref 30.0–36.0)
MCV: 87.2 fL (ref 80.0–100.0)
Platelets: 235 10*3/uL (ref 150–400)
RBC: 4.38 MIL/uL (ref 4.22–5.81)
RDW: 13.3 % (ref 11.5–15.5)
WBC: 8.9 10*3/uL (ref 4.0–10.5)
nRBC: 0 % (ref 0.0–0.2)

## 2024-05-27 LAB — MAGNESIUM: Magnesium: 1.6 mg/dL — ABNORMAL LOW (ref 1.7–2.4)

## 2024-05-27 MED ORDER — MAGNESIUM OXIDE -MG SUPPLEMENT 400 (240 MG) MG PO TABS
800.0000 mg | ORAL_TABLET | Freq: Two times a day (BID) | ORAL | Status: AC
  Administered 2024-05-27 (×2): 800 mg via ORAL
  Filled 2024-05-27 (×2): qty 2

## 2024-05-27 MED ORDER — MAGNESIUM SULFATE 2 GM/50ML IV SOLN
2.0000 g | Freq: Once | INTRAVENOUS | Status: AC
  Administered 2024-05-27: 2 g via INTRAVENOUS
  Filled 2024-05-27: qty 50

## 2024-05-27 MED ORDER — INSULIN GLARGINE-YFGN 100 UNIT/ML ~~LOC~~ SOLN
50.0000 [IU] | Freq: Two times a day (BID) | SUBCUTANEOUS | Status: DC
  Administered 2024-05-27: 50 [IU] via SUBCUTANEOUS
  Filled 2024-05-27 (×3): qty 0.5

## 2024-05-27 MED ORDER — GABAPENTIN 300 MG PO CAPS
300.0000 mg | ORAL_CAPSULE | Freq: Two times a day (BID) | ORAL | Status: DC
  Administered 2024-05-27 – 2024-05-30 (×6): 300 mg via ORAL
  Filled 2024-05-27 (×6): qty 1

## 2024-05-27 NOTE — Progress Notes (Signed)
 PROGRESS NOTE    Samuel Bailey  WNU:[id number] DOB: November 06, 1961 DOA: 05/21/2024 PCP: Alonso Jan., MD    Brief Narrative:   63 year old with history of DM2, COPD, HTN, HLD, CKD 3A, admitted to [facility] for infected right foot wound with cellulitis in the setting of diabetes.  This foot wound occurred about 2 weeks ago and has been worsening since then.  X-ray of the foot was suggestive of cellulitis, MRI also suggestive of cellulitis and no evidence of osteomyelitis.  Initially started on broad-spectrum antibiotics eventually transition to p.o. linezolid  to complete total 7-day course.  Case was discussed with ID about this as well.  Due to rising creatinine, nephrology consulted  Assessment & Plan:  Principal Problem:   Cellulitis of right lower extremity Active Problems:   DM2 (diabetes mellitus, type 2) (HCC)   Essential hypertension   HLD (hyperlipidemia)   Elevated troponin   Thoracic aortic aneurysm (TAA) (HCC)   History of COPD   CKD stage 3a, GFR 45-59 ml/min (HCC)   Right foot infected diabetic foot wound with surrounding cellulitis - Started about 2 weeks ago, x-ray shows cellulitic changes. Which was confirmed on MRI foot, no evidece of osteo.  Wound cultures are growing MSSA. - Broad-spectrum antibiotics: Vancomycin  Rocephin  and Flagyl  > to Linezolid  (discussed with ID).  EOT 6/9 (Photo under media section)    AKI on CKD stage IIIa - Creatinine at baseline 1.5, creatinine trending up, renal ultrasound negative.  Creatinine pending today.  Contrast-induced versus volume overload?  Received Lasix  80 mg IV once  Peripheral interstitial/groundglass opacity in the lungs - Seen on the CTA chest.  No obvious evidence of pulmonary infection.  As needed bronchodilators.  Currently on room air.  Status post Lasix  80 mg once, will give Lasix  120 mg twice daily   Elevated troponin - No chest pain, EKG is normal.  Really insignificant elevation at this time. -Echo shows EF of  45%, grade 1 DD.  Diabetes mellitus type 2, insulin -dependent Peripheral neuropathy - A1c 12.6.  Adjust insulin  due to uncontrolled blood sugars - Continue gabapentin , adjust dosing  History of COPD - Bronchodilators  Hyperlipidemia - Statin  Essential hypertension - On aspirin  and statin - Toprol -XL.  IV as needed   Thoracic aortic aneurysm, 4 cm - Known history of this.  Follow-up outpatient PCP   DVT prophylaxis: SCDs Start: 05/21/24 1950    Code Status: Full Code Family Communication:   Continue hospital stay until renal function improves.      Subjective: Tells me he is coughing better this morning.  No exertional dyspnea. Examination:  General exam: Appears calm and comfortable  Respiratory system: Bilateral rhonchi, improved Cardiovascular system: S1 & S2 heard, RRR. No JVD, murmurs, rubs, gallops or clicks. No pedal edema. Gastrointestinal system: Abdomen is nondistended, soft and nontender. No organomegaly or masses felt. Normal bowel sounds heard. Central nervous system: Alert and oriented. No focal neurological deficits. Extremities: Left-sided AKA.  Right foot puncture wound on plantar surface.  Surrounding erythema and warmth Skin: No rashes, lesions or ulcers Psychiatry: Judgement and insight appear normal. Mood & affect appropriate.                Diet Orders (From admission, onward)     Start     Ordered   05/21/24 1950  Diet regular Room service appropriate? Yes; Fluid consistency: Thin  Diet effective now       Question Answer Comment  Room service appropriate? Yes   Fluid  consistency: Thin      05/21/24 1950            Objective: Vitals:   05/27/24 0500 05/27/24 0757 05/27/24 1032 05/27/24 1036  BP:  119/87 (!) 132/100 (!) 130/100  Pulse:  95 95 94  Resp:  19  20  Temp:  98.4 F (36.9 C)    TempSrc:      SpO2:  98%  96%  Weight: 116.6 kg     Height:        Intake/Output Summary (Last 24 hours) at 05/27/2024  1153 Last data filed at 05/27/2024 1148 Gross per 24 hour  Intake 240 ml  Output 5675 ml  Net -5435 ml   Filed Weights   05/25/24 0416 05/26/24 0417 05/27/24 0500  Weight: 119.2 kg 118.5 kg 116.6 kg    Scheduled Meds:  aspirin  EC  81 mg Oral Daily   atorvastatin   40 mg Oral Daily   enoxaparin  (LOVENOX ) injection  55 mg Subcutaneous Q24H   gabapentin   300 mg Oral BID   insulin  aspart  0-15 Units Subcutaneous TID WC   insulin  aspart  0-5 Units Subcutaneous QHS   insulin  aspart  6 Units Subcutaneous TID WC   insulin  glargine-yfgn  50 Units Subcutaneous BID   linezolid   600 mg Oral Q12H   magnesium oxide  800 mg Oral BID   metoprolol  succinate  12.5 mg Oral Daily   pantoprazole   40 mg Oral BID   umeclidinium bromide   1 puff Inhalation Daily   Continuous Infusions:  furosemide  120 mg (05/27/24 1145)   magnesium sulfate bolus IVPB      Nutritional status     Body mass index is 39.2 kg/m.  Data Reviewed:   CBC: Recent Labs  Lab 05/22/24 0549 05/23/24 0640 05/24/24 0301 05/25/24 0321 05/26/24 0818 05/27/24 0440  WBC 7.1 7.4 10.4 8.4 8.4 8.9  NEUTROABS 4.2  --   --   --   --   --   HGB 12.0* 11.9* 12.3* 11.9* 12.9* 12.5*  HCT 36.9* 36.1* 37.4* 36.7* 38.7* 38.2*  MCV 86.8 87.6 87.4 88.0 86.6 87.2  PLT 209 201 228 205 241 235   Basic Metabolic Panel: Recent Labs  Lab 05/22/24 0549 05/23/24 0640 05/23/24 1248 05/24/24 0301 05/25/24 0321 05/26/24 0818 05/27/24 0440  NA 135 135  --  135 132* 132* 133*  K 3.9 4.1  --  4.4 4.2 4.9 4.2  CL 105 106  --  104 106 101 98  CO2 22 21*  --  22 21* 22 23  GLUCOSE 199* 261* 377* 198* 241* 201* 215*  BUN 22 22  --  23 23 26* 28*  CREATININE 1.68* 2.03*  --  2.18* 2.25* 2.22* 2.27*  CALCIUM  8.5* 8.5*  --  8.4* 7.6* 8.2* 8.6*  MG 1.7 1.9  --  2.0 1.9 1.7 1.6*  PHOS 4.6 4.6  --   --   --   --   --    GFR: Estimated Creatinine Clearance: 41.8 mL/min (A) (by C-G formula based on SCr of 2.27 mg/dL (H)). Liver Function  Tests: Recent Labs  Lab 05/22/24 0549  AST 8*  ALT 10  ALKPHOS 60  BILITOT 0.4  PROT 5.4*  ALBUMIN 2.1*   No results for input(s): "LIPASE", "AMYLASE" in the last 168 hours. No results for input(s): "AMMONIA" in the last 168 hours. Coagulation Profile: No results for input(s): "INR", "PROTIME" in the last 168 hours. Cardiac Enzymes: No results for  input(s): "CKTOTAL", "CKMB", "CKMBINDEX", "TROPONINI" in the last 168 hours. BNP (last 3 results) No results for input(s): "PROBNP" in the last 8760 hours. HbA1C: No results for input(s): "HGBA1C" in the last 72 hours. CBG: Recent Labs  Lab 05/26/24 1234 05/26/24 1637 05/26/24 2039 05/27/24 0756 05/27/24 1142  GLUCAP 222* 219* 247* 226* 307*   Lipid Profile: No results for input(s): "CHOL", "HDL", "LDLCALC", "TRIG", "CHOLHDL", "LDLDIRECT" in the last 72 hours. Thyroid Function Tests: No results for input(s): "TSH", "T4TOTAL", "FREET4", "T3FREE", "THYROIDAB" in the last 72 hours. Anemia Panel: No results for input(s): "VITAMINB12", "FOLATE", "FERRITIN", "TIBC", "IRON", "RETICCTPCT" in the last 72 hours. Sepsis Labs: Recent Labs  Lab 05/21/24 1515  PROCALCITON <0.10    Recent Results (from the past 240 hours)  Resp panel by RT-PCR (RSV, Flu A&B, Covid) Anterior Nasal Swab     Status: None   Collection Time: 05/21/24  2:50 PM   Specimen: Anterior Nasal Swab  Result Value Ref Range Status   SARS Coronavirus 2 by RT PCR NEGATIVE NEGATIVE Final   Influenza A by PCR NEGATIVE NEGATIVE Final   Influenza B by PCR NEGATIVE NEGATIVE Final    Comment: (NOTE) The Xpert Xpress SARS-CoV-2/FLU/RSV plus assay is intended as an aid in the diagnosis of influenza from Nasopharyngeal swab specimens and should not be used as a sole basis for treatment. Nasal washings and aspirates are unacceptable for Xpert Xpress SARS-CoV-2/FLU/RSV testing.  Fact Sheet for Patients: BloggerCourse.com  Fact Sheet for Healthcare  Providers: SeriousBroker.it  This test is not yet approved or cleared by the [place]  FDA and has been authorized for detection and/or diagnosis of SARS-CoV-2 by FDA under an Emergency Use Authorization (EUA). This EUA will remain in effect (meaning this test can be used) for the duration of the COVID-19 declaration under Section 564(b)(1) of the Act, 21 U.S.C. section 360bbb-3(b)(1), unless the authorization is terminated or revoked.     Resp Syncytial Virus by PCR NEGATIVE NEGATIVE Final    Comment: (NOTE) Fact Sheet for Patients: BloggerCourse.com  Fact Sheet for Healthcare Providers: SeriousBroker.it  This test is not yet approved or cleared by the [place]  FDA and has been authorized for detection and/or diagnosis of SARS-CoV-2 by FDA under an Emergency Use Authorization (EUA). This EUA will remain in effect (meaning this test can be used) for the duration of the COVID-19 declaration under Section 564(b)(1) of the Act, 21 U.S.C. section 360bbb-3(b)(1), unless the authorization is terminated or revoked.  Performed at [facility], [address] [place], [place] [address]          Radiology Studies: No results found.         LOS: 5 days   Time spent= 35 mins    Maggie Schooner, MD [facility]  If 7PM-7AM, please contact night-coverage  05/27/2024, 11:53 AM

## 2024-05-27 NOTE — Progress Notes (Signed)
 [facility] Progress Note  Subjective:  He has been diuresed for orthopnea.  He had 5.4 liters UOP over 6/8 charted.  He has been on lasix  120 mg IV BID.    Review of systems:  Reports shortness of breath - breathing today is about the same as yesterday Denies n/v States has had a headache and dizziness for several months.   Vitals:   05/26/24 2037 05/27/24 0451 05/27/24 0500 05/27/24 0757  BP: (!) 149/106 139/75  119/87  Pulse: 97 92  95  Resp: 19 19  19   Temp: 98.2 F (36.8 C) 97.7 F (36.5 C)  98.4 F (36.9 C)  TempSrc:      SpO2: 98% 95%  98%  Weight:   116.6 kg   Height:        Physical Exam:   General adult male in bed in no acute distress HEENT normocephalic atraumatic extraocular movements intact sclera anicteric Neck supple trachea midline Lungs clear to auscultation bilaterally normal work of breathing at rest on room air Heart S1S2 no rub Abdomen soft nontender nondistended Extremities left AKA; RLE with 2-3+ edema   Psych normal mood and affect Neuro alert and oriented x 3 provides hx and follows commands         Renal-related home meds: Toprol  xl 12.5 daily Others: zofran , Spiriva , PPI, insulin  x 2, gabapentin , statin, asa   Date                             Creat               eGFR (ml/min) 2020- 2022                  0.64- 0.85 2023                            1.02                 > 60  5/31- 05/25/23               1.68 >> 1.35    46- 60 ml/min   05/21/24                        1.56                 50 ml/min         6/04                             1.68 6/05                             2.03 6/06                             2.18                              05/25/24                        2.25     UA 05/21/24 ->  glu > 500, small Hb, prot > 300, 0-5 rbc/wbc/epi UA 05/19/23 -> glu > 500,  ket 5, prot > 300, 0-5 epi/wbc/rbc Renal US  05/24/24 --> 11.4/ 13.4 cm kidneys w/ no hydro and normal echotexture CXR 6/03 - no active disease CTA chest  6/03: IMPRESSION: Peripheral interstitial and ground-glass opacities in the bilateral lower lobes, nonspecific. Findings may be infectious/inflammatory BP here 164/92 on admission, no hypotension episodes < 120 while here     Assessment/ Plan: AKI on CKD 3a: AKI secondary to contrast and diabetic CKD stage 3a.  Baseline Creatinine 1.3- 1.5 from mid 2024 and 05/21/24, eGFR 50- 60 ml/min. Creat here was 1.5 on admission, and has been rising daily since up to low 2's. Pt rec'd IV contrast w/ CTA chest on 6/03, no other nephrotoxins, no hypotension. Renal US  w/o obstruction. UA +protein, no rbcs/ wbcs. C/o orthopnea, can't lie down. CXR w/o edema.    Continue lasix  at current dosing.  Short of breath and this is unchanged with aggressive diuresis per pt Ordered strict ins/outs  Daily weights are ordered  Would recommend reducing gabapentin  to 300 mg BID - this may help with dizziness R foot infection/ cellulitis: no osteo per MRI, per pmd CKD stage 3a - BL Cr 1.3 -1.5.  due to diabetic kidney disease DM type 2: long-standing.  Per primary team  Hypomag - repleted.   Disposition - continue inpatient monitoring       Recent Labs  Lab 05/22/24 0549 05/23/24 0640 05/24/24 0301 05/26/24 0818 05/27/24 0440  HGB 12.0* 11.9*   < > 12.9* 12.5*  ALBUMIN 2.1*  --   --   --   --   CALCIUM  8.5* 8.5*   < > 8.2* 8.6*  PHOS 4.6 4.6  --   --   --   CREATININE 1.68* 2.03*   < > 2.22* 2.27*  K 3.9 4.1   < > 4.9 4.2   < > = values in this interval not displayed.   No results for input(s): "IRON", "TIBC", "FERRITIN" in the last 168 hours. Inpatient medications:  aspirin  EC  81 mg Oral Daily   atorvastatin   40 mg Oral Daily   enoxaparin  (LOVENOX ) injection  55 mg Subcutaneous Q24H   gabapentin   300 mg Oral TID   insulin  aspart  0-15 Units Subcutaneous TID WC   insulin  aspart  0-5 Units Subcutaneous QHS   insulin  aspart  6 Units Subcutaneous TID WC   insulin  glargine-yfgn  45 Units Subcutaneous BID    linezolid   600 mg Oral Q12H   metoprolol  succinate  12.5 mg Oral Daily   pantoprazole   40 mg Oral BID   umeclidinium bromide   1 puff Inhalation Daily    furosemide  120 mg (05/26/24 1726)   acetaminophen  **OR** acetaminophen , fentaNYL  (SUBLIMAZE ) injection, glucagon  (human recombinant), guaiFENesin , hydrALAZINE , ipratropium-albuterol , labetalol , melatonin, metoprolol  tartrate, naLOXone  (NARCAN )  injection, ondansetron  (ZOFRAN ) IV, senna-docusate, traZODone      Nan Aver. MD 10:37 AM 05/27/2024

## 2024-05-27 NOTE — Plan of Care (Signed)
 Assumed care at 1900. Pt has been resting comfortably in bed overnight. Pt has expressed complaints of pain overnight. See MAR. No significant events overnight.    Problem: Education: Goal: Ability to describe self-care measures that may prevent or decrease complications (Diabetes Survival Skills Education) will improve Outcome: Progressing Goal: Individualized Educational Video(s) Outcome: Progressing   Problem: Coping: Goal: Ability to adjust to condition or change in health will improve Outcome: Progressing   Problem: Health Behavior/Discharge Planning: Goal: Ability to identify and utilize available resources and services will improve Outcome: Progressing Goal: Ability to manage health-related needs will improve Outcome: Progressing   Problem: Nutritional: Goal: Maintenance of adequate nutrition will improve Outcome: Progressing Goal: Progress toward achieving an optimal weight will improve Outcome: Progressing   Problem: Skin Integrity: Goal: Risk for impaired skin integrity will decrease Outcome: Progressing   Problem: Tissue Perfusion: Goal: Adequacy of tissue perfusion will improve Outcome: Progressing

## 2024-05-28 ENCOUNTER — Inpatient Hospital Stay (HOSPITAL_COMMUNITY)

## 2024-05-28 DIAGNOSIS — L03115 Cellulitis of right lower limb: Secondary | ICD-10-CM | POA: Diagnosis not present

## 2024-05-28 LAB — CBC
HCT: 37.9 % — ABNORMAL LOW (ref 39.0–52.0)
Hemoglobin: 12.6 g/dL — ABNORMAL LOW (ref 13.0–17.0)
MCH: 28.6 pg (ref 26.0–34.0)
MCHC: 33.2 g/dL (ref 30.0–36.0)
MCV: 85.9 fL (ref 80.0–100.0)
Platelets: 228 10*3/uL (ref 150–400)
RBC: 4.41 MIL/uL (ref 4.22–5.81)
RDW: 13.3 % (ref 11.5–15.5)
WBC: 8.3 10*3/uL (ref 4.0–10.5)
nRBC: 0 % (ref 0.0–0.2)

## 2024-05-28 LAB — BASIC METABOLIC PANEL WITH GFR
Anion gap: 14 (ref 5–15)
BUN: 35 mg/dL — ABNORMAL HIGH (ref 8–23)
CO2: 22 mmol/L (ref 22–32)
Calcium: 8.4 mg/dL — ABNORMAL LOW (ref 8.9–10.3)
Chloride: 97 mmol/L — ABNORMAL LOW (ref 98–111)
Creatinine, Ser: 2.5 mg/dL — ABNORMAL HIGH (ref 0.61–1.24)
GFR, Estimated: 28 mL/min — ABNORMAL LOW (ref >60)
Glucose, Bld: 215 mg/dL — ABNORMAL HIGH (ref 70–99)
Potassium: 4.2 mmol/L (ref 3.5–5.1)
Sodium: 133 mmol/L — ABNORMAL LOW (ref 135–145)

## 2024-05-28 LAB — PHOSPHORUS: Phosphorus: 5.5 mg/dL — ABNORMAL HIGH (ref 2.5–4.6)

## 2024-05-28 LAB — GLUCOSE, CAPILLARY
Glucose-Capillary: 190 mg/dL — ABNORMAL HIGH (ref 70–99)
Glucose-Capillary: 295 mg/dL — ABNORMAL HIGH (ref 70–99)
Glucose-Capillary: 197 mg/dL — ABNORMAL HIGH (ref 70–99)
Glucose-Capillary: 248 mg/dL — ABNORMAL HIGH (ref 70–99)

## 2024-05-28 LAB — MAGNESIUM: Magnesium: 1.9 mg/dL (ref 1.7–2.4)

## 2024-05-28 MED ORDER — FUROSEMIDE 10 MG/ML IJ SOLN
80.0000 mg | Freq: Two times a day (BID) | INTRAMUSCULAR | Status: DC
  Administered 2024-05-29 – 2024-05-30 (×3): 80 mg via INTRAVENOUS
  Filled 2024-05-28 (×3): qty 8

## 2024-05-28 MED ORDER — INSULIN GLARGINE-YFGN 100 UNIT/ML ~~LOC~~ SOLN
56.0000 [IU] | Freq: Two times a day (BID) | SUBCUTANEOUS | Status: DC
  Administered 2024-05-28 – 2024-05-30 (×5): 56 [IU] via SUBCUTANEOUS
  Filled 2024-05-28 (×6): qty 0.56

## 2024-05-28 NOTE — Plan of Care (Signed)

## 2024-05-28 NOTE — Plan of Care (Addendum)
 Assumed care at 1900. Pt has been resting comfortably in bed overnight. Pt has expressed pain, see MAR. No significant events overnight. Pt expresses desire to get discharged soon.  Pt complained of a sore throat and this RN educated him on the importance of oral care especially with antibiotic use, whether or not he has teeth.  Problem: Education: Goal: Ability to describe self-care measures that may prevent or decrease complications (Diabetes Survival Skills Education) will improve Outcome: Progressing Goal: Individualized Educational Video(s) Outcome: Progressing   Problem: Coping: Goal: Ability to adjust to condition or change in health will improve Outcome: Progressing   Problem: Fluid Volume: Goal: Ability to maintain a balanced intake and output will improve Outcome: Progressing   Problem: Health Behavior/Discharge Planning: Goal: Ability to identify and utilize available resources and services will improve Outcome: Progressing Goal: Ability to manage health-related needs will improve Outcome: Progressing   Problem: Tissue Perfusion: Goal: Adequacy of tissue perfusion will improve Outcome: Progressing   Problem: Education: Goal: Knowledge of General Education information will improve Description: Including pain rating scale, medication(s)/side effects and non-pharmacologic comfort measures Outcome: Progressing

## 2024-05-28 NOTE — Progress Notes (Signed)
 PROGRESS NOTE    Samuel Bailey  ZOX:[id number] DOB: 1960-12-21 DOA: 05/21/2024 PCP: Alonso Jan., MD    Brief Narrative:   63 year old with history of DM2, COPD, HTN, HLD, CKD 3A, admitted to [facility] for infected right foot wound with cellulitis in the setting of diabetes.  This foot wound occurred about 2 weeks ago and has been worsening since then.  X-ray of the foot was suggestive of cellulitis, MRI also suggestive of cellulitis and no evidence of osteomyelitis.  Initially started on broad-spectrum antibiotics later transitioned to linezolid .  Completed 7-day course on 6/9. Now hospital course was complicated by some volume overload, and AKI.  AKI suspected from possible volume overload and contrast-induced nephropathy.  Nephrology is following.  Assessment & Plan:  Principal Problem:   Cellulitis of right lower extremity Active Problems:   DM2 (diabetes mellitus, type 2) (HCC)   Essential hypertension   HLD (hyperlipidemia)   Elevated troponin   Thoracic aortic aneurysm (TAA) (HCC)   History of COPD   CKD stage 3a, GFR 45-59 ml/min (HCC)    AKI on CKD stage IIIa - Baseline creatinine 1.5 now continues to trend up.  Today 2.5.  Wonder if this is contrast-induced nephropathy but due to some signs of volume overload he is also getting IV Lasix .  Nephrology team is following.  Renal ultrasound is negative  Right foot infected diabetic foot wound with surrounding cellulitis - Started about 2 weeks ago, x-ray shows cellulitic changes. Which was confirmed on MRI foot, no evidece of osteo.  Wound cultures are growing MSSA. - Initially on broad-spectrum antibiotics later transitioned to linezolid  and completed 7-day course on 6/9 (Photo under media section)   Diabetes mellitus type 2, insulin -dependent, uncontrolled due to hyperglycemia Peripheral neuropathy - A1c 12.6.  Adjust insulin  due to uncontrolled blood sugars - Continue gabapentin , adjust dosing  Peripheral  interstitial/groundglass opacity in the lungs - Seen on the CTA chest.  No obvious evidence of pulmonary infection.  As needed bronchodilators.  Currently on room air.  Diuretics per nephrology  Elevated troponin - No chest pain, EKG is normal.  Really insignificant elevation at this time. -Echo shows EF of 45%, grade 1 DD.  History of COPD - Bronchodilators  Hyperlipidemia - Statin  Essential hypertension - On aspirin  and statin - Toprol -XL.  IV as needed   Thoracic aortic aneurysm, 4 cm - Known history of this.  Follow-up outpatient PCP   DVT prophylaxis: SCDs Start: 05/21/24 1950    Code Status: Full Code Family Communication:   Continue hospital stay until renal function improves.      Subjective: Resting comfortably, does not have any complaints today  Examination:  General exam: Appears calm and comfortable  Respiratory system: Bilateral rhonchi, improved Cardiovascular system: S1 & S2 heard, RRR. No JVD, murmurs, rubs, gallops or clicks. No pedal edema. Gastrointestinal system: Abdomen is nondistended, soft and nontender. No organomegaly or masses felt. Normal bowel sounds heard. Central nervous system: Alert and oriented. No focal neurological deficits. Extremities: Left-sided AKA.  Right foot puncture wound on plantar surface.  Surrounding erythema and warmth Skin: No rashes, lesions or ulcers Psychiatry: Judgement and insight appear normal. Mood & affect appropriate.                Diet Orders (From admission, onward)     Start     Ordered   05/21/24 1950  Diet regular Room service appropriate? Yes; Fluid consistency: Thin  Diet effective now  Question Answer Comment  Room service appropriate? Yes   Fluid consistency: Thin      05/21/24 1950            Objective: Vitals:   05/28/24 0500 05/28/24 0738 05/28/24 0852 05/28/24 0859  BP:  (!) 126/44 115/77   Pulse:  95 100   Resp:      Temp:  98 F (36.7 C)    TempSrc:       SpO2:  97%  95%  Weight: 115.2 kg     Height:        Intake/Output Summary (Last 24 hours) at 05/28/2024 1048 Last data filed at 05/28/2024 0600 Gross per 24 hour  Intake 956 ml  Output 2950 ml  Net -1994 ml   Filed Weights   05/26/24 0417 05/27/24 0500 05/28/24 0500  Weight: 118.5 kg 116.6 kg 115.2 kg    Scheduled Meds:  aspirin  EC  81 mg Oral Daily   atorvastatin   40 mg Oral Daily   enoxaparin  (LOVENOX ) injection  55 mg Subcutaneous Q24H   gabapentin   300 mg Oral BID   insulin  aspart  0-15 Units Subcutaneous TID WC   insulin  aspart  0-5 Units Subcutaneous QHS   insulin  aspart  6 Units Subcutaneous TID WC   insulin  glargine-yfgn  56 Units Subcutaneous BID   metoprolol  succinate  12.5 mg Oral Daily   pantoprazole   40 mg Oral BID   umeclidinium bromide   1 puff Inhalation Daily   Continuous Infusions:  furosemide  120 mg (05/28/24 0934)    Nutritional status     Body mass index is 38.73 kg/m.  Data Reviewed:   CBC: Recent Labs  Lab 05/22/24 0549 05/23/24 0640 05/24/24 0301 05/25/24 0321 05/26/24 0818 05/27/24 0440 05/28/24 0422  WBC 7.1   < > 10.4 8.4 8.4 8.9 8.3  NEUTROABS 4.2  --   --   --   --   --   --   HGB 12.0*   < > 12.3* 11.9* 12.9* 12.5* 12.6*  HCT 36.9*   < > 37.4* 36.7* 38.7* 38.2* 37.9*  MCV 86.8   < > 87.4 88.0 86.6 87.2 85.9  PLT 209   < > 228 205 241 235 228   < > = values in this interval not displayed.   Basic Metabolic Panel: Recent Labs  Lab 05/22/24 0549 05/23/24 0640 05/23/24 1248 05/24/24 0301 05/25/24 0321 05/26/24 0818 05/27/24 0440 05/28/24 0422  NA 135 135  --  135 132* 132* 133* 133*  K 3.9 4.1  --  4.4 4.2 4.9 4.2 4.2  CL 105 106  --  104 106 101 98 97*  CO2 22 21*  --  22 21* 22 23 22   GLUCOSE 199* 261*   < > 198* 241* 201* 215* 215*  BUN 22 22  --  23 23 26* 28* 35*  CREATININE 1.68* 2.03*  --  2.18* 2.25* 2.22* 2.27* 2.50*  CALCIUM  8.5* 8.5*  --  8.4* 7.6* 8.2* 8.6* 8.4*  MG 1.7 1.9  --  2.0 1.9 1.7 1.6* 1.9   PHOS 4.6 4.6  --   --   --   --   --  5.5*   < > = values in this interval not displayed.   GFR: Estimated Creatinine Clearance: 37.7 mL/min (A) (by C-G formula based on SCr of 2.5 mg/dL (H)). Liver Function Tests: Recent Labs  Lab 05/22/24 0549  AST 8*  ALT 10  ALKPHOS 60  BILITOT 0.4  PROT 5.4*  ALBUMIN 2.1*   No results for input(s): "LIPASE", "AMYLASE" in the last 168 hours. No results for input(s): "AMMONIA" in the last 168 hours. Coagulation Profile: No results for input(s): "INR", "PROTIME" in the last 168 hours. Cardiac Enzymes: No results for input(s): "CKTOTAL", "CKMB", "CKMBINDEX", "TROPONINI" in the last 168 hours. BNP (last 3 results) No results for input(s): "PROBNP" in the last 8760 hours. HbA1C: No results for input(s): "HGBA1C" in the last 72 hours. CBG: Recent Labs  Lab 05/27/24 0756 05/27/24 1142 05/27/24 1711 05/27/24 1943 05/28/24 0746  GLUCAP 226* 307* 228* 306* 190*   Lipid Profile: No results for input(s): "CHOL", "HDL", "LDLCALC", "TRIG", "CHOLHDL", "LDLDIRECT" in the last 72 hours. Thyroid Function Tests: No results for input(s): "TSH", "T4TOTAL", "FREET4", "T3FREE", "THYROIDAB" in the last 72 hours. Anemia Panel: No results for input(s): "VITAMINB12", "FOLATE", "FERRITIN", "TIBC", "IRON", "RETICCTPCT" in the last 72 hours. Sepsis Labs: Recent Labs  Lab 05/21/24 1515  PROCALCITON <0.10    Recent Results (from the past 240 hours)  Resp panel by RT-PCR (RSV, Flu A&B, Covid) Anterior Nasal Swab     Status: None   Collection Time: 05/21/24  2:50 PM   Specimen: Anterior Nasal Swab  Result Value Ref Range Status   SARS Coronavirus 2 by RT PCR NEGATIVE NEGATIVE Final   Influenza A by PCR NEGATIVE NEGATIVE Final   Influenza B by PCR NEGATIVE NEGATIVE Final    Comment: (NOTE) The Xpert Xpress SARS-CoV-2/FLU/RSV plus assay is intended as an aid in the diagnosis of influenza from Nasopharyngeal swab specimens and should not be used as a  sole basis for treatment. Nasal washings and aspirates are unacceptable for Xpert Xpress SARS-CoV-2/FLU/RSV testing.  Fact Sheet for Patients: BloggerCourse.com  Fact Sheet for Healthcare Providers: SeriousBroker.it  This test is not yet approved or cleared by the [place]  FDA and has been authorized for detection and/or diagnosis of SARS-CoV-2 by FDA under an Emergency Use Authorization (EUA). This EUA will remain in effect (meaning this test can be used) for the duration of the COVID-19 declaration under Section 564(b)(1) of the Act, 21 U.S.C. section 360bbb-3(b)(1), unless the authorization is terminated or revoked.     Resp Syncytial Virus by PCR NEGATIVE NEGATIVE Final    Comment: (NOTE) Fact Sheet for Patients: BloggerCourse.com  Fact Sheet for Healthcare Providers: SeriousBroker.it  This test is not yet approved or cleared by the [place]  FDA and has been authorized for detection and/or diagnosis of SARS-CoV-2 by FDA under an Emergency Use Authorization (EUA). This EUA will remain in effect (meaning this test can be used) for the duration of the COVID-19 declaration under Section 564(b)(1) of the Act, 21 U.S.C. section 360bbb-3(b)(1), unless the authorization is terminated or revoked.  Performed at [facility], [address], [place], [place] [address]          Radiology Studies: No results found.         LOS: 6 days   Time spent= 35 mins    Maggie Schooner, MD [facility]  If 7PM-7AM, please contact night-coverage  05/28/2024, 10:48 AM

## 2024-05-28 NOTE — Progress Notes (Signed)
 [facility] Progress Note  Subjective:  He has been diuresed for orthopnea.  He had 3.2 liters UOP over 6/9 charted.  He has been on lasix  120 mg IV BID.  His grandson comes home later this week (Friday) from camp and his grandson stays with him.  He is hoping to get home on Thursday.    Review of systems:  Reports shortness of breath - breathing today is about the same as yesterday He reports nausea without vomiting  Dizziness   Vitals:   05/28/24 0500 05/28/24 0738 05/28/24 0852 05/28/24 0859  BP:  (!) 126/44 115/77   Pulse:  95 100   Resp:      Temp:  98 F (36.7 C)    TempSrc:      SpO2:  97%  95%  Weight: 115.2 kg     Height:        Physical Exam:   General adult male in bed in no acute distress HEENT normocephalic atraumatic extraocular movements intact sclera anicteric Neck supple trachea midline Lungs clear to auscultation bilaterally normal work of breathing at rest on room air Heart S1S2 no rub Abdomen soft nontender nondistended Extremities left AKA; RLE with 2+ edema, softer Psych normal mood and affect Neuro alert and oriented x 3 provides hx and follows commands         Renal-related home meds: Toprol  xl 12.5 daily Others: zofran , Spiriva , PPI, insulin  x 2, gabapentin , statin, asa   Date                             Creat               eGFR (ml/min) 2020- 2022                  0.64- 0.85 2023                            1.02                 > 60  5/31- 05/25/23               1.68 >> 1.35    46- 60 ml/min   05/21/24                        1.56                 50 ml/min         6/04                             1.68 6/05                             2.03 6/06                             2.18                              05/25/24                        2.25     UA 05/21/24 ->  glu >  500, small Hb, prot > 300, 0-5 rbc/wbc/epi UA 05/19/23 -> glu > 500, ket 5, prot > 300, 0-5 epi/wbc/rbc Renal US  05/24/24 --> 11.4/ 13.4 cm kidneys w/ no hydro and  normal echotexture CXR 6/03 - no active disease CTA chest 6/03: IMPRESSION: Peripheral interstitial and ground-glass opacities in the bilateral lower lobes, nonspecific. Findings may be infectious/inflammatory BP here 164/92 on admission, no hypotension episodes < 120 while here     Assessment/ Plan: AKI on CKD 3a: AKI secondary to contrast and diabetic CKD stage 3a.  Baseline Creatinine 1.3- 1.5 from mid 2024 and 05/21/24, eGFR 50- 60 ml/min. Creat here was 1.5 on admission, and has been rising daily since up to low 2's. Pt rec'd IV contrast w/ CTA chest on 6/03, no other nephrotoxins, no hypotension. Renal US  w/o obstruction. UA +protein, no rbcs/ wbcs. C/o orthopnea, can't lie down. CXR w/o edema.    Pause lasix  today - just got a dose of 120 mg IV once this AM.  Resume lasix  80 mg IV BID tomorrow for now.  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  May be in part related to habitus.  Edema much softer  R foot infection/ cellulitis: no osteo per MRI, per primary team  CKD stage 3a - BL Cr 1.3 -1.5.  Due to diabetic kidney disease DM type 2: long-standing.  Per primary team  Hypomag - improved with repletion   Disposition - continue inpatient monitoring.  If possible the patient is hoping for discharge on Thursday.  Reached out to primary team        Recent Labs  Lab 05/22/24 0549 05/23/24 0640 05/24/24 0301 05/27/24 0440 05/28/24 0422  HGB 12.0* 11.9*   < > 12.5* 12.6*  ALBUMIN 2.1*  --   --   --   --   CALCIUM  8.5* 8.5*   < > 8.6* 8.4*  PHOS 4.6 4.6  --   --  5.5*  CREATININE 1.68* 2.03*   < > 2.27* 2.50*  K 3.9 4.1   < > 4.2 4.2   < > = values in this interval not displayed.   No results for input(s): "IRON", "TIBC", "FERRITIN" in  the last 168 hours. Inpatient medications:  aspirin  EC  81 mg Oral Daily   atorvastatin   40 mg Oral Daily   enoxaparin  (LOVENOX ) injection  55 mg Subcutaneous Q24H   gabapentin   300 mg Oral BID   insulin  aspart  0-15 Units Subcutaneous TID WC   insulin  aspart  0-5 Units Subcutaneous QHS   insulin  aspart  6 Units Subcutaneous TID WC   insulin  glargine-yfgn  56 Units Subcutaneous BID   metoprolol  succinate  12.5 mg Oral Daily   pantoprazole   40 mg Oral BID   umeclidinium bromide   1 puff Inhalation Daily    furosemide  120 mg (05/28/24 0934)   acetaminophen  **OR** acetaminophen , fentaNYL  (SUBLIMAZE ) injection, glucagon  (human recombinant), guaiFENesin , hydrALAZINE , ipratropium-albuterol , labetalol , melatonin, metoprolol  tartrate, naLOXone  (NARCAN )  injection, ondansetron  (ZOFRAN ) IV, senna-docusate, traZODone      Nan Aver. MD 11:30 AM 05/28/2024

## 2024-05-29 DIAGNOSIS — L03115 Cellulitis of right lower limb: Secondary | ICD-10-CM | POA: Diagnosis not present

## 2024-05-29 LAB — CBC
HCT: 37 % — ABNORMAL LOW (ref 39.0–52.0)
Hemoglobin: 12.4 g/dL — ABNORMAL LOW (ref 13.0–17.0)
MCH: 29 pg (ref 26.0–34.0)
MCHC: 33.5 g/dL (ref 30.0–36.0)
MCV: 86.4 fL (ref 80.0–100.0)
Platelets: 216 10*3/uL (ref 150–400)
RBC: 4.28 MIL/uL (ref 4.22–5.81)
RDW: 13.3 % (ref 11.5–15.5)
WBC: 7.1 10*3/uL (ref 4.0–10.5)
nRBC: 0 % (ref 0.0–0.2)

## 2024-05-29 LAB — BASIC METABOLIC PANEL WITH GFR
Anion gap: 11 (ref 5–15)
BUN: 40 mg/dL — ABNORMAL HIGH (ref 8–23)
CO2: 22 mmol/L (ref 22–32)
Calcium: 8.1 mg/dL — ABNORMAL LOW (ref 8.9–10.3)
Chloride: 99 mmol/L (ref 98–111)
Creatinine, Ser: 2.6 mg/dL — ABNORMAL HIGH (ref 0.61–1.24)
GFR, Estimated: 27 mL/min — ABNORMAL LOW (ref >60)
Glucose, Bld: 193 mg/dL — ABNORMAL HIGH (ref 70–99)
Potassium: 4.5 mmol/L (ref 3.5–5.1)
Sodium: 132 mmol/L — ABNORMAL LOW (ref 135–145)

## 2024-05-29 LAB — GLUCOSE, CAPILLARY
Glucose-Capillary: 198 mg/dL — ABNORMAL HIGH (ref 70–99)
Glucose-Capillary: 243 mg/dL — ABNORMAL HIGH (ref 70–99)
Glucose-Capillary: 285 mg/dL — ABNORMAL HIGH (ref 70–99)
Glucose-Capillary: 282 mg/dL — ABNORMAL HIGH (ref 70–99)

## 2024-05-29 LAB — MAGNESIUM: Magnesium: 2 mg/dL (ref 1.7–2.4)

## 2024-05-29 LAB — IRON AND TIBC
Iron: 76 ug/dL (ref 45–182)
Saturation Ratios: 26 % (ref 17.9–39.5)
TIBC: 294 ug/dL (ref 250–450)
UIBC: 218 ug/dL

## 2024-05-29 LAB — FOLATE: Folate: 10.5 ng/mL (ref >5.9)

## 2024-05-29 LAB — VITAMIN D 25 HYDROXY (VIT D DEFICIENCY, FRACTURES): Vit D, 25-Hydroxy: 15.86 ng/mL — ABNORMAL LOW (ref 30–100)

## 2024-05-29 LAB — VITAMIN B12: Vitamin B-12: 185 pg/mL (ref 180–914)

## 2024-05-29 MED ORDER — BENZOCAINE 10 % MT GEL
Freq: Three times a day (TID) | OROMUCOSAL | Status: DC | PRN
  Administered 2024-05-30: 1 via OROMUCOSAL

## 2024-05-29 MED ORDER — BENZOCAINE 10 % MT GEL
Freq: Once | OROMUCOSAL | Status: AC
  Administered 2024-05-29: 1 via OROMUCOSAL
  Filled 2024-05-29 (×2): qty 9

## 2024-05-29 NOTE — Progress Notes (Signed)
 [facility] Progress Note  Subjective:  He had 900 mL UOP over 6/10 as well as 375 mL of other output not clarified.  He states that he wasn't nauseated or dizzy this morning - this is the best he's felt while here.    Review of systems:    He states shortness of breath is improved Denies n/v No chest pain    Vitals:   05/28/24 0859 05/28/24 2021 05/29/24 0417 05/29/24 0841  BP:  (!) 142/77 129/75 (!) 144/58  Pulse:  94 87 91  Resp:  20 20 18   Temp:  (!) 97.3 F (36.3 C) 97.8 F (36.6 C) 97.9 F (36.6 C)  TempSrc:      SpO2: 95% 98% 95% 95%  Weight:   115.2 kg   Height:        Physical Exam:     General adult male in bed in no acute distress HEENT normocephalic atraumatic extraocular movements intact sclera anicteric Neck supple trachea midline Lungs clear to auscultation bilaterally normal work of breathing at rest on room air Heart S1S2 no rub Abdomen soft nontender nondistended Extremities left AKA; RLE with 2+ edema, softer Psych normal mood and affect Neuro alert and oriented x 3 provides hx and follows commands         Renal-related home meds: Toprol  xl 12.5 daily Others: zofran , Spiriva , PPI, insulin  x 2, gabapentin , statin, asa   Date                             Creat               eGFR (ml/min) 2020- 2022                  0.64- 0.85 2023                            1.02                 > 60  5/31- 05/25/23               1.68 >> 1.35    46- 60 ml/min   05/21/24                        1.56                 50 ml/min         6/04                             1.68 6/05                             2.03 6/06                             2.18                              05/25/24                        2.25     UA 05/21/24 ->  glu > 500, small Hb, prot > 300, 0-5 rbc/wbc/epi UA  05/19/23 -> glu > 500, ket 5, prot > 300, 0-5 epi/wbc/rbc Renal US  05/24/24 --> 11.4/ 13.4 cm kidneys w/ no hydro and normal echotexture CXR 6/03 - no active disease CTA chest  6/03: IMPRESSION: Peripheral interstitial and ground-glass opacities in the bilateral lower lobes, nonspecific. Findings may be infectious/inflammatory BP here 164/92 on admission, no hypotension episodes < 120 while here     Assessment/ Plan: AKI on CKD 3a: AKI secondary to contrast and diabetic CKD stage 3a.  Baseline Creatinine 1.3- 1.5 from mid 2024 and 05/21/24, eGFR 50- 60 ml/min. Creat here was 1.5 on admission, and has been rising daily since up to low 2's. Pt rec'd IV contrast w/ CTA chest on 6/03, no other nephrotoxins, no hypotension. Renal US  w/o obstruction. UA +protein, no rbcs/ wbcs. C/o orthopnea, can't lie down. CXR w/o edema.    Lasix  80 mg IV BID today.  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  Not on a diuretic outpatient.  Would start lasix  80 mg PO BID on discharge Ordered strict ins/outs  Added a fluid restriction - he's not thrilled Daily weights are ordered Shortness of breath may be in part related to body habitus.  Edema much softer thankfully   R foot infection/ cellulitis: no osteo per MRI, per primary team  CKD stage 3a - BL Cr 1.3 -1.5.  Due to diabetic kidney disease DM type 2: long-standing.  Per primary team  Hypomag - improved with repletion   Disposition - continue inpatient monitoring.  If possible the patient is hoping for discharge on Thursday.  I have requested follow-up with [facility] with me or  an extender in one week and I have asked schedulers to reach out to let me know if any difficulty in scheduling this      Recent Labs  Lab 05/23/24 0640 05/24/24 0301 05/28/24 0422 05/29/24 0421  HGB 11.9*   < > 12.6* 12.4*  CALCIUM  8.5*   < > 8.4* 8.1*  PHOS 4.6  --  5.5*  --   CREATININE 2.03*   < > 2.50* 2.60*  K 4.1   < > 4.2 4.5   < > = values in this interval not displayed.   Recent Labs  Lab 05/29/24 0421  IRON 76  TIBC 294   Inpatient medications:  aspirin  EC  81 mg Oral Daily   atorvastatin   40 mg  Oral Daily   benzocaine   Mouth/Throat Once   enoxaparin  (LOVENOX ) injection  55 mg Subcutaneous Q24H   furosemide   80 mg Intravenous BID   gabapentin   300 mg Oral BID   insulin  aspart  0-15 Units Subcutaneous TID WC   insulin  aspart  0-5 Units Subcutaneous QHS   insulin  aspart  6 Units Subcutaneous TID WC   insulin  glargine-yfgn  56 Units Subcutaneous BID   metoprolol  succinate  12.5 mg Oral Daily   pantoprazole   40 mg Oral BID   umeclidinium bromide   1 puff Inhalation Daily     acetaminophen  **OR** acetaminophen , benzocaine, fentaNYL  (SUBLIMAZE ) injection, glucagon  (human recombinant), guaiFENesin , hydrALAZINE , ipratropium-albuterol , labetalol , melatonin, metoprolol  tartrate, naLOXone  (NARCAN )  injection, ondansetron  (ZOFRAN ) IV, senna-docusate, traZODone      Nan Aver. MD 2:11 PM 05/29/2024

## 2024-05-29 NOTE — Plan of Care (Signed)

## 2024-05-29 NOTE — Plan of Care (Signed)
 Assumed care at 1900. Pt has been resting comfortably in bed overnight. Pt has c/o pain overnight, see MAR. Pt ambulates using wheelchair independently. No significant events.    Problem: Fluid Volume: Goal: Ability to maintain a balanced intake and output will improve Outcome: Progressing   Problem: Skin Integrity: Goal: Risk for impaired skin integrity will decrease Outcome: Progressing   Problem: Tissue Perfusion: Goal: Adequacy of tissue perfusion will improve Outcome: Progressing   Problem: Clinical Measurements: Goal: Ability to maintain clinical measurements within normal limits will improve Outcome: Progressing Goal: Will remain free from infection Outcome: Progressing Goal: Diagnostic test results will improve Outcome: Progressing Goal: Respiratory complications will improve Outcome: Progressing Goal: Cardiovascular complication will be avoided Outcome: Progressing   Problem: Elimination: Goal: Will not experience complications related to bowel motility Outcome: Progressing Goal: Will not experience complications related to urinary retention Outcome: Progressing   Problem: Pain Managment: Goal: General experience of comfort will improve and/or be controlled Outcome: Progressing   Problem: Safety: Goal: Ability to remain free from injury will improve Outcome: Progressing

## 2024-05-29 NOTE — Progress Notes (Signed)
 PROGRESS NOTE    Samuel Bailey  WUJ:[id number] DOB: 06-Jan-1961 DOA: 05/21/2024 PCP: Alonso Jan., MD    Brief Narrative:   63 year old with history of DM2, COPD, HTN, HLD, CKD 3A, admitted to [facility] for infected right foot wound with cellulitis in the setting of diabetes.  This foot wound occurred about 2 weeks ago and has been worsening since then.  X-ray of the foot was suggestive of cellulitis, MRI also suggestive of cellulitis and no evidence of osteomyelitis.  Initially started on broad-spectrum antibiotics later transitioned to linezolid .  Completed 7-day course on 6/9. Now hospital course was complicated by some volume overload, and AKI.  AKI suspected from possible volume overload and contrast-induced nephropathy.  Nephrology is following.  Assessment & Plan:  Principal Problem:   Cellulitis of right lower extremity Active Problems:   DM2 (diabetes mellitus, type 2) (HCC)   Essential hypertension   HLD (hyperlipidemia)   Elevated troponin   Thoracic aortic aneurysm (TAA) (HCC)   History of COPD   CKD stage 3a, GFR 45-59 ml/min (HCC)    AKI on CKD stage IIIa - Baseline creatinine 1.5 now continues to trend up.  Today 2.5.  Wonder if this is contrast-induced nephropathy but due to some signs of volume overload he is also getting IV Lasix .  Nephrology team is following.  Renal ultrasound is negative  Right foot infected diabetic foot wound with surrounding cellulitis - Started about 2 weeks ago, x-ray shows cellulitic changes. Which was confirmed on MRI foot, no evidece of osteo.  Wound cultures are growing MSSA. - Initially on broad-spectrum antibiotics later transitioned to linezolid  and completed 7-day course on 6/9 (Photo under media section)   Diabetes mellitus type 2, insulin -dependent, uncontrolled due to hyperglycemia Peripheral neuropathy - A1c 12.6.  Adjust insulin  due to uncontrolled blood sugars - Continue gabapentin , adjust dosing  Peripheral  interstitial/groundglass opacity in the lungs - Seen on the CTA chest.  No obvious evidence of pulmonary infection.  As needed bronchodilators.  Currently on room air.  Diuretics per nephrology  Elevated troponin - No chest pain, EKG is normal.  Really insignificant elevation at this time. -Echo shows EF of 45%, grade 1 DD.  History of COPD - Bronchodilators  Hyperlipidemia - Statin  Essential hypertension - On aspirin  and statin - Toprol -XL.  IV as needed   Thoracic aortic aneurysm, 4 cm - Known history of this.  Follow-up outpatient PCP   DVT prophylaxis: SCDs Start: 05/21/24 1950    Code Status: Full Code Family Communication:   Continue hospital stay until renal function improves.      Subjective: Patient was seen and examined at bedside today.  Patient was sitting comfortably on the bed.  Denied any complaints, no pain.  Making enough urine.  Right foot dressing was changed. Patient was complaining of tongue soreness for past 2 days.  Agreed to use the Orajel. Patient would like to go home tomorrow a.m. as he has something to do on Friday.   Examination:  General exam: Appears calm and comfortable  Respiratory system: Bilateral rhonchi, improved Cardiovascular system: S1 & S2 heard, RRR. No JVD, murmurs, rubs, gallops or clicks. No pedal edema. Gastrointestinal system: Abdomen is nondistended, soft and nontender. No organomegaly or masses felt. Normal bowel sounds heard. Central nervous system: Alert and oriented. No focal neurological deficits. Extremities: Left-sided AKA.  Right foot puncture wound on plantar surface.  Surrounding erythema and warmth.  Dressing intact Skin: No rashes, lesions or ulcers Psychiatry: Judgement  and insight appear normal. Mood & affect appropriate.        Diet Orders (From admission, onward)     Start     Ordered   05/28/24 1113  Diet regular Room service appropriate? Yes; Fluid consistency: Thin; Fluid restriction: 1200 mL  Fluid  Diet effective now       Question Answer Comment  Room service appropriate? Yes   Fluid consistency: Thin   Fluid restriction: 1200 mL Fluid      05/28/24 1113            Objective: Vitals:   05/28/24 0859 05/28/24 2021 05/29/24 0417 05/29/24 0841  BP:  (!) 142/77 129/75 (!) 144/58  Pulse:  94 87 91  Resp:  20 20 18   Temp:  (!) 97.3 F (36.3 C) 97.8 F (36.6 C) 97.9 F (36.6 C)  TempSrc:      SpO2: 95% 98% 95% 95%  Weight:   115.2 kg   Height:        Intake/Output Summary (Last 24 hours) at 05/29/2024 1420 Last data filed at 05/29/2024 0900 Gross per 24 hour  Intake 592 ml  Output 300 ml  Net 292 ml   Filed Weights   05/27/24 0500 05/28/24 0500 05/29/24 0417  Weight: 116.6 kg 115.2 kg 115.2 kg    Scheduled Meds:  aspirin  EC  81 mg Oral Daily   atorvastatin   40 mg Oral Daily   benzocaine   Mouth/Throat Once   enoxaparin  (LOVENOX ) injection  55 mg Subcutaneous Q24H   furosemide   80 mg Intravenous BID   gabapentin   300 mg Oral BID   insulin  aspart  0-15 Units Subcutaneous TID WC   insulin  aspart  0-5 Units Subcutaneous QHS   insulin  aspart  6 Units Subcutaneous TID WC   insulin  glargine-yfgn  56 Units Subcutaneous BID   metoprolol  succinate  12.5 mg Oral Daily   pantoprazole   40 mg Oral BID   umeclidinium bromide   1 puff Inhalation Daily   Continuous Infusions:    Nutritional status     Body mass index is 38.73 kg/m.  Data Reviewed:   CBC: Recent Labs  Lab 05/25/24 0321 05/26/24 0818 05/27/24 0440 05/28/24 0422 05/29/24 0421  WBC 8.4 8.4 8.9 8.3 7.1  HGB 11.9* 12.9* 12.5* 12.6* 12.4*  HCT 36.7* 38.7* 38.2* 37.9* 37.0*  MCV 88.0 86.6 87.2 85.9 86.4  PLT 205 241 235 228 216   Basic Metabolic Panel: Recent Labs  Lab 05/23/24 0640 05/23/24 1248 05/25/24 0321 05/26/24 0818 05/27/24 0440 05/28/24 0422 05/29/24 0421  NA 135   < > 132* 132* 133* 133* 132*  K 4.1   < > 4.2 4.9 4.2 4.2 4.5  CL 106   < > 106 101 98 97* 99  CO2 21*    < > 21* 22 23 22 22   GLUCOSE 261*   < > 241* 201* 215* 215* 193*  BUN 22   < > 23 26* 28* 35* 40*  CREATININE 2.03*   < > 2.25* 2.22* 2.27* 2.50* 2.60*  CALCIUM  8.5*   < > 7.6* 8.2* 8.6* 8.4* 8.1*  MG 1.9   < > 1.9 1.7 1.6* 1.9 2.0  PHOS 4.6  --   --   --   --  5.5*  --    < > = values in this interval not displayed.   GFR: Estimated Creatinine Clearance: 36.3 mL/min (A) (by C-G formula based on SCr of 2.6 mg/dL (H)). Liver Function Tests:  No results for input(s): AST, ALT, ALKPHOS, BILITOT, PROT, ALBUMIN in the last 168 hours.  No results for input(s): LIPASE, AMYLASE in the last 168 hours. No results for input(s): AMMONIA in the last 168 hours. Coagulation Profile: No results for input(s): INR, PROTIME in the last 168 hours. Cardiac Enzymes: No results for input(s): CKTOTAL, CKMB, CKMBINDEX, TROPONINI in the last 168 hours. BNP (last 3 results) No results for input(s): PROBNP in the last 8760 hours. HbA1C: No results for input(s): HGBA1C in the last 72 hours. CBG: Recent Labs  Lab 05/28/24 1124 05/28/24 1700 05/28/24 2020 05/29/24 0847 05/29/24 1211  GLUCAP 295* 197* 248* 198* 243*   Lipid Profile: No results for input(s): CHOL, HDL, LDLCALC, TRIG, CHOLHDL, LDLDIRECT in the last 72 hours. Thyroid Function Tests: No results for input(s): TSH, T4TOTAL, FREET4, T3FREE, THYROIDAB in the last 72 hours. Anemia Panel: Recent Labs    05/29/24 0420 05/29/24 0421  VITAMINB12  --  185  FOLATE 10.5  --   TIBC  --  294  IRON  --  76   Sepsis Labs: No results for input(s): PROCALCITON, LATICACIDVEN in the last 168 hours.   Recent Results (from the past 240 hours)  Resp panel by RT-PCR (RSV, Flu A&B, Covid) Anterior Nasal Swab     Status: None   Collection Time: 05/21/24  2:50 PM   Specimen: Anterior Nasal Swab  Result Value Ref Range Status   SARS Coronavirus 2 by RT PCR NEGATIVE NEGATIVE Final   Influenza A  by PCR NEGATIVE NEGATIVE Final   Influenza B by PCR NEGATIVE NEGATIVE Final    Comment: (NOTE) The Xpert Xpress SARS-CoV-2/FLU/RSV plus assay is intended as an aid in the diagnosis of influenza from Nasopharyngeal swab specimens and should not be used as a sole basis for treatment. Nasal washings and aspirates are unacceptable for Xpert Xpress SARS-CoV-2/FLU/RSV testing.  Fact Sheet for Patients: BloggerCourse.com  Fact Sheet for Healthcare Providers: SeriousBroker.it  This test is not yet approved or cleared by the [place]  FDA and has been authorized for detection and/or diagnosis of SARS-CoV-2 by FDA under an Emergency Use Authorization (EUA). This EUA will remain in effect (meaning this test can be used) for the duration of the COVID-19 declaration under Section 564(b)(1) of the Act, 21 U.S.C. section 360bbb-3(b)(1), unless the authorization is terminated or revoked.     Resp Syncytial Virus by PCR NEGATIVE NEGATIVE Final    Comment: (NOTE) Fact Sheet for Patients: BloggerCourse.com  Fact Sheet for Healthcare Providers: SeriousBroker.it  This test is not yet approved or cleared by the [place]  FDA and has been authorized for detection and/or diagnosis of SARS-CoV-2 by FDA under an Emergency Use Authorization (EUA). This EUA will remain in effect (meaning this test can be used) for the duration of the COVID-19 declaration under Section 564(b)(1) of the Act, 21 U.S.C. section 360bbb-3(b)(1), unless the authorization is terminated or revoked.  Performed at [facility], [address], [place], [place] [address]          Radiology Studies: DG CHEST PORT 1 VIEW Result Date: 05/28/2024 CLINICAL DATA:  Shortness of breath. EXAM: PORTABLE CHEST 1 VIEW COMPARISON:  May 21, 2024. FINDINGS: The heart size and mediastinal contours are within normal limits. Both  lungs are clear. The visualized skeletal structures are unremarkable. IMPRESSION: No active disease. Electronically Signed   By: Rosalene Colon M.D.   On: 05/28/2024 15:23      LOS: 7 days  Time spent= 35 mins    Althia Atlas, MD [facility]  If 7PM-7AM, please contact night-coverage  05/29/2024, 2:20 PM

## 2024-05-30 ENCOUNTER — Other Ambulatory Visit (HOSPITAL_COMMUNITY): Payer: Self-pay

## 2024-05-30 DIAGNOSIS — L03115 Cellulitis of right lower limb: Secondary | ICD-10-CM | POA: Diagnosis not present

## 2024-05-30 LAB — CBC
HCT: 37.9 % — ABNORMAL LOW (ref 39.0–52.0)
Hemoglobin: 12.2 g/dL — ABNORMAL LOW (ref 13.0–17.0)
MCH: 28.1 pg (ref 26.0–34.0)
MCHC: 32.2 g/dL (ref 30.0–36.0)
MCV: 87.3 fL (ref 80.0–100.0)
Platelets: 210 10*3/uL (ref 150–400)
RBC: 4.34 MIL/uL (ref 4.22–5.81)
RDW: 13.2 % (ref 11.5–15.5)
WBC: 7.3 10*3/uL (ref 4.0–10.5)
nRBC: 0 % (ref 0.0–0.2)

## 2024-05-30 LAB — BASIC METABOLIC PANEL WITH GFR
Anion gap: 10 (ref 5–15)
BUN: 43 mg/dL — ABNORMAL HIGH (ref 8–23)
CO2: 26 mmol/L (ref 22–32)
Calcium: 8.3 mg/dL — ABNORMAL LOW (ref 8.9–10.3)
Chloride: 98 mmol/L (ref 98–111)
Creatinine, Ser: 2.6 mg/dL — ABNORMAL HIGH (ref 0.61–1.24)
GFR, Estimated: 27 mL/min — ABNORMAL LOW (ref >60)
Glucose, Bld: 163 mg/dL — ABNORMAL HIGH (ref 70–99)
Potassium: 4.5 mmol/L (ref 3.5–5.1)
Sodium: 134 mmol/L — ABNORMAL LOW (ref 135–145)

## 2024-05-30 LAB — PHOSPHORUS: Phosphorus: 4.9 mg/dL — ABNORMAL HIGH (ref 2.5–4.6)

## 2024-05-30 LAB — GLUCOSE, CAPILLARY
Glucose-Capillary: 179 mg/dL — ABNORMAL HIGH (ref 70–99)
Glucose-Capillary: 302 mg/dL — ABNORMAL HIGH (ref 70–99)

## 2024-05-30 LAB — MAGNESIUM: Magnesium: 2.1 mg/dL (ref 1.7–2.4)

## 2024-05-30 MED ORDER — CYANOCOBALAMIN 1000 MCG/ML IJ SOLN
1000.0000 ug | Freq: Once | INTRAMUSCULAR | Status: AC
  Administered 2024-05-30: 1000 ug via INTRAMUSCULAR
  Filled 2024-05-30: qty 1

## 2024-05-30 MED ORDER — METOLAZONE 5 MG PO TABS
5.0000 mg | ORAL_TABLET | Freq: Once | ORAL | Status: AC
  Administered 2024-05-30: 5 mg via ORAL
  Filled 2024-05-30 (×2): qty 1

## 2024-05-30 MED ORDER — VITAMIN B-12 1000 MCG PO TABS: 1000.0000 ug | ORAL_TABLET | Freq: Every day | ORAL | Status: DC

## 2024-05-30 MED ORDER — FUROSEMIDE 80 MG PO TABS
80.0000 mg | ORAL_TABLET | Freq: Two times a day (BID) | ORAL | 2 refills | Status: AC
  Filled 2024-05-30: qty 60, 30d supply, fill #0

## 2024-05-30 MED ORDER — FUROSEMIDE 40 MG PO TABS: 80.0000 mg | ORAL_TABLET | Freq: Two times a day (BID) | ORAL | Status: DC

## 2024-05-30 MED ORDER — CYANOCOBALAMIN 1000 MCG PO TABS
1000.0000 ug | ORAL_TABLET | Freq: Every day | ORAL | 2 refills | Status: AC
  Filled 2024-05-30: qty 30, 30d supply, fill #0

## 2024-05-30 MED ORDER — VITAMIN D (ERGOCALCIFEROL) 1.25 MG (50000 UNIT) PO CAPS
50000.0000 [IU] | ORAL_CAPSULE | ORAL | Status: DC
  Administered 2024-05-30: 50000 [IU] via ORAL
  Filled 2024-05-30: qty 1

## 2024-05-30 MED ORDER — CYANOCOBALAMIN 1000 MCG/ML IJ SOLN: 1000.0000 ug | Freq: Once | INTRAMUSCULAR | Status: DC

## 2024-05-30 MED ORDER — VITAMIN D (ERGOCALCIFEROL) 1.25 MG (50000 UNIT) PO CAPS
50000.0000 [IU] | ORAL_CAPSULE | ORAL | 0 refills | Status: AC
Start: 2024-06-06 — End: 2024-09-04
  Filled 2024-05-30: qty 12, 84d supply, fill #0

## 2024-05-30 MED ORDER — FUROSEMIDE 10 MG/ML IJ SOLN
80.0000 mg | Freq: Once | INTRAMUSCULAR | Status: AC
  Administered 2024-05-30: 80 mg via INTRAVENOUS
  Filled 2024-05-30: qty 8

## 2024-05-30 NOTE — Discharge Summary (Signed)
 [facility] Discharge Summary   Patient: Samuel Bailey:[id number]  PCP: Alonso Jan., MD  Date of admission: 05/21/2024   Date of discharge: 05/30/2024     Discharge Diagnoses:  Principal Problem:   Cellulitis of right lower extremity Active Problems:   DM2 (diabetes mellitus, type 2) (HCC)   Essential hypertension   HLD (hyperlipidemia)   Elevated troponin   Thoracic aortic aneurysm (TAA) (HCC)   History of COPD   CKD stage 3a, GFR 45-59 ml/min (HCC)   Infected wound   Admitted From: Home Disposition: Home  Recommendations for Outpatient Follow-up:  Follow-up with PCP in 1 week,  Continue fluid restriction 1.5 L/day Follow with nephrology in 1 week and repeat BMP in 1 week, titrate dose of diuretics accordingly. Follow-up with wound care in 1 week Follow-up with podiatry in 1 week Follow up LABS/TEST:  repeat BMP in 1 week, repeat vitamin D and B12 level after 3 to 6 months.   Follow-up Information     Alonso Jan., MD Follow up in 1 week(s).   Specialty: Internal Medicine Contact information: [address] [place] [address] [phone]         Nan Aver, MD Follow up in 1 week(s).   Specialty: Nephrology Contact information: [address] [place] [address] [phone]                Diet recommendation: Cardiac and Carb modified diet  Activity: The patient is advised to gradually reintroduce usual activities, as tolerated  Discharge Condition: stable  Code Status: Full code   History of present illness: As per the H and P dictated on admission. Hospital Course:  63 year old with history of DM2, COPD, HTN, HLD, CKD 3A, admitted to [facility] for infected right foot wound with cellulitis in the setting of diabetes.  This foot wound occurred about 2 weeks ago and has been worsening since then.  X-ray of the foot was suggestive of cellulitis, MRI also suggestive of cellulitis and no evidence of  osteomyelitis.  Initially started on broad-spectrum antibiotics later transitioned to linezolid .  Completed 7-day course on 6/9. Now hospital course was complicated by some volume overload, and AKI.  AKI suspected from possible volume overload and contrast-induced nephropathy.  Nephrology is following.    Assessment & Plan:    # AKI on CKD stage IIIa Baseline creatinine 1.5 which trended up to 2.6, peaked, stable.  Wonder if this is contrast-induced nephropathy but due to some signs of volume overload. S/p IV Lasix  80 mg every 12 hourly given as per nephro.  Today patient also received 1 dose of metolazone.    Renal ultrasound is negative.  Nephrology was following.  Patient was cleared to discharge on Lasix  80 mg p.o. twice daily.  Follow with the nephro as an outpatient and repeat BMP after 1 week.   # Right foot infected diabetic foot wound with surrounding cellulitis. It started about 2 weeks ago, x-ray shows cellulitic changes. Which was confirmed on MRI foot, no evidece of osteo.  Wound cultures are growing MSSA. Initially patient was on broad-spectrum antibiotics later transitioned to linezolid  and completed 7-day course on 6/9. (Photo under media section)   # Diabetes mellitus type 2, insulin -dependent, uncontrolled with hyperglycemia and Peripheral neuropathy A1c 12.6.  Adjust insulin  due to uncontrolled blood sugars Continue gabapentin , adjust dosing.  Continue diabetic diet, monitor CBG at home and follow with the PCP to titrate medication accordingly.   # Peripheral interstitial/groundglass  opacity in the lungs Seen on the CTA chest.  No obvious evidence of pulmonary infection.  As needed bronchodilators.  Currently on room air.  Diuretics per nephrology   # Elevated troponin No chest pain, EKG is normal.  Really insignificant elevation at this time. Echo shows EF of 45%, grade 1 DD.   # History of COPD: Bronchodilators # Hyperlipidemia:  Statin # Essential hypertension: On  aspirin , statin and Toprol -XL.  # Thoracic aortic aneurysm, 4 cm Known history of this.  Follow-up outpatient PCP  # Vitamin B12 deficiency: S/p vitamin B12 1000 mcg IM injection x 1 dose given before discharge, followed by oral supplement.  Follow-up PCP to repeat vitamin B12 level after 3 to 6 months.  # Vitamin D deficiency: started vitamin D 50,000 units p.o. weekly, follow with PCP to repeat vitamin D level after 3 to 6 months.   Body mass index is 38.83 kg/m.  Nutrition Interventions: - Patient was instructed, not to drive, operate heavy machinery, perform activities at heights, swimming or participation in water activities or provide baby sitting services while on Pain, Sleep and Anxiety Medications; until his outpatient Physician has advised to do so again.  - Also recommended to not to take more than prescribed Pain, Sleep and Anxiety Medications.  Patient was ambulatory without any assistance. On the day of the discharge the patient's vitals were stable, and no other acute medical condition were reported by patient. the patient was felt safe to be discharge at Home.  Consultants: Nephrology Procedures: None  Discharge Exam: General: Appear in no distress, no Rash; Oral Mucosa Clear, moist. Cardiovascular: S1 and S2 Present, no Murmur, Respiratory: normal respiratory effort, Bilateral Air entry present and no Crackles, no wheezes Abdomen: Bowel Sound present, Soft and no tenderness, no hernia Extremities: s/p Left AKA, R foot cellulitis, dressing CDI, 3-4+ edema, chronic venous stasis changes.   Neurology: alert and oriented to time, place, and person affect appropriate.  Filed Weights   05/28/24 0500 05/29/24 0417 05/30/24 0501  Weight: 115.2 kg 115.2 kg 115.5 kg   Vitals:   05/30/24 0845 05/30/24 0853  BP:    Pulse:  95  Resp:  16  Temp:    SpO2: 97% 97%    DISCHARGE MEDICATION: Allergies as of 05/30/2024       Reactions   Lovastatin    diarrhea         Medication List     TAKE these medications    acetaminophen  325 MG tablet Commonly known as: TYLENOL  Take 2 tablets (650 mg total) by mouth every 6 (six) hours as needed for mild pain, fever or headache.   aspirin  EC 81 MG tablet Take 1 tablet (81 mg total) by mouth daily.   atorvastatin  40 MG tablet Commonly known as: LIPITOR Take 1 tablet (40 mg total) by mouth daily.   B-D ULTRAFINE III SHORT PEN 31G X 8 MM Misc Generic drug: Insulin  Pen Needle Inject into the skin daily.   cyanocobalamin 1000 MCG tablet Take 1 tablet (1,000 mcg total) by mouth daily. Start taking on: May 31, 2024   furosemide  80 MG tablet Commonly known as: LASIX  Take 1 tablet (80 mg total) by mouth 2 (two) times daily. Start taking on: May 31, 2024   gabapentin  300 MG capsule Commonly known as: NEURONTIN  Take 1 capsule (300 mg total) by mouth 2 (two) times daily. What changed: when to take this   insulin  aspart 100 UNIT/ML injection Commonly known as: novoLOG  Inject 10  Units into the skin 3 (three) times daily with meals. What changed: how much to take   insulin  glargine 100 unit/mL Sopn Commonly known as: LANTUS  Inject 75 Units into the skin 2 (two) times daily. What changed: how much to take   metoprolol  succinate 25 MG 24 hr tablet Commonly known as: TOPROL -XL TAKE 1/2 TABLET (12.5 MG TOTAL) BY MOUTH DAILY.   omeprazole 40 MG capsule Commonly known as: PRILOSEC Take 40 mg by mouth in the morning and at bedtime.   ondansetron  4 MG tablet Commonly known as: Zofran  Take 1 tablet (4 mg total) by mouth every 8 (eight) hours as needed for nausea or vomiting.   senna-docusate 8.6-50 MG tablet Commonly known as: Senokot-S Take 1 tablet by mouth daily.   tiotropium 18 MCG inhalation capsule Commonly known as: SPIRIVA  Place 18 mcg into inhaler and inhale daily.   Vitamin D (Ergocalciferol) 1.25 MG (50000 UNIT) Caps capsule Commonly known as: DRISDOL Take 1 capsule (50,000 Units  total) by mouth every 7 (seven) days. Start taking on: June 06, 2024               Discharge Care Instructions  (From admission, onward)           Start     Ordered   05/30/24 0000  Discharge wound care:       Comments: As above   05/30/24 1414           Allergies  Allergen Reactions   Lovastatin     diarrhea   Discharge Instructions     Call MD for:  difficulty breathing, headache or visual disturbances   Complete by: As directed    Call MD for:  extreme fatigue   Complete by: As directed    Call MD for:  persistant dizziness or light-headedness   Complete by: As directed    Call MD for:  persistant nausea and vomiting   Complete by: As directed    Call MD for:  redness, tenderness, or signs of infection (pain, swelling, redness, odor or green/yellow discharge around incision site)   Complete by: As directed    Call MD for:  severe uncontrolled pain   Complete by: As directed    Call MD for:  temperature >100.4   Complete by: As directed    Diet - low sodium heart healthy   Complete by: As directed    Fluid restriction 1.5 L/day   Diet Carb Modified   Complete by: As directed    Discharge instructions   Complete by: As directed    Follow-up with PCP in 1 week, repeat BMP in 1 week, repeat vitamin D and B12 level after 3 to 6 months. Continue fluid restriction 1.5 L/day Follow with nephrology in 1 week and repeat BMP in 1 week, titrate dose of diuretics accordingly. Follow-up with wound care in 1 week Follow-up with podiatry in 1 week   Discharge wound care:   Complete by: As directed    As above   Increase activity slowly   Complete by: As directed        The results of significant diagnostics from this hospitalization (including imaging, microbiology, ancillary and laboratory) are listed below for reference.    Significant Diagnostic Studies: DG CHEST PORT 1 VIEW Result Date: 05/28/2024 CLINICAL DATA:  Shortness of breath. EXAM: PORTABLE  CHEST 1 VIEW COMPARISON:  May 21, 2024. FINDINGS: The heart size and mediastinal contours are within normal limits. Both lungs are clear. The  visualized skeletal structures are unremarkable. IMPRESSION: No active disease. Electronically Signed   By: Rosalene Colon M.D.   On: 05/28/2024 15:23   US  RENAL Result Date: 05/24/2024 CLINICAL DATA:  [id number] AKI (acute kidney injury) (HCC) [id number] EXAM: RENAL / URINARY TRACT ULTRASOUND COMPLETE COMPARISON:  None Available. FINDINGS: Right Kidney: Renal measurements: 11.4 x 5.7 x 7.1 cm = volume: 243 mL. Normal echogenicity. No mass. No hydronephrosis or nephrolithiasis. Left Kidney: Renal measurements: 13.4 x 5.9 x 6 cm = volume: 249 mL. Normal echogenicity. No mass. No hydronephrosis or nephrolithiasis. Bladder: Appears normal for degree of bladder distention. Other: Mild hepatic steatosis. IMPRESSION: 1. No hydronephrosis or nephrolithiasis. 2. Mild hepatic steatosis. Electronically Signed   By: Rance Burrows M.D.   On: 05/24/2024 08:14   VAS US  LOWER EXTREMITY VENOUS (DVT) (ONLY [facility] & [facility]) Result Date: 05/22/2024  Lower Venous DVT Study Patient Name:  JAKAREE PICKARD  Date of Exam:   05/22/2024 Medical Rec #: [id number]      Accession #:    [id number] Date of Birth: [birth year]      Patient Gender: M Patient Age:   89 years Exam Location:  [facility] Procedure:      VAS US  LOWER EXTREMITY VENOUS (DVT) Referring Phys: Paris Bolds --------------------------------------------------------------------------------  Indications: Swelling.  Risk Factors: None identified. Limitations: Body habitus and poor ultrasound/tissue interface. Comparison Study: No prior studies. Performing Technologist: Lerry Ransom RVT  Examination Guidelines: A complete evaluation includes B-mode imaging, spectral Doppler, color Doppler, and power Doppler as needed of all accessible portions of each vessel. Bilateral testing is considered an integral part of a complete examination. Limited  examinations for reoccurring indications may be performed as noted. The reflux portion of the exam is performed with the patient in reverse Trendelenburg.  +---------+---------------+---------+-----------+----------+-------------------+ RIGHT    CompressibilityPhasicitySpontaneityPropertiesThrombus Aging      +---------+---------------+---------+-----------+----------+-------------------+ CFV      Full           Yes      Yes                                      +---------+---------------+---------+-----------+----------+-------------------+ SFJ      Full                                                             +---------+---------------+---------+-----------+----------+-------------------+ FV Prox  Full                                                             +---------+---------------+---------+-----------+----------+-------------------+ FV Mid   Full                                                             +---------+---------------+---------+-----------+----------+-------------------+ FV DistalFull                                                             +---------+---------------+---------+-----------+----------+-------------------+  PFV      Full                                                             +---------+---------------+---------+-----------+----------+-------------------+ POP      Full           Yes      Yes                                      +---------+---------------+---------+-----------+----------+-------------------+ PTV      Full                                                             +---------+---------------+---------+-----------+----------+-------------------+ PERO                                                  Not well visualized +---------+---------------+---------+-----------+----------+-------------------+   +----+---------------+---------+-----------+----------+--------------+  LEFTCompressibilityPhasicitySpontaneityPropertiesThrombus Aging +----+---------------+---------+-----------+----------+--------------+ CFV Full           Yes      Yes                                 +----+---------------+---------+-----------+----------+--------------+    Summary: RIGHT: - There is no evidence of deep vein thrombosis in the lower extremity. However, portions of this examination were limited- see technologist comments above.  - No cystic structure found in the popliteal fossa.  LEFT: - No evidence of common femoral vein obstruction.   *See table(s) above for measurements and observations. Electronically signed by Jimmye Moulds MD on 05/22/2024 at 2:18:48 PM.    Final    MR FOOT RIGHT W WO CONTRAST Result Date: 05/22/2024 CLINICAL DATA:  Right foot wound.  Concern for osteomyelitis. EXAM: MRI OF THE RIGHT FOREFOOT WITHOUT AND WITH CONTRAST TECHNIQUE: Multiplanar, multisequence MR imaging of the right foot was performed before and after the administration of intravenous contrast. CONTRAST:  10mL GADAVIST  GADOBUTROL  1 MMOL/ML IV SOLN COMPARISON:  Right foot radiograph dated 05/21/2024. MRI of the right foot dated 10/26/2019. FINDINGS: Bones/Joint/Cartilage No marrow signal abnormality identified to suggest acute osteomyelitis. No acute fracture. No joint effusion. 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 Calcaneal enthesopathy. Ligaments Medial and lateral ankle ligamentous complex is grossly intact. Lisfranc ligament is not clearly identified and likely chronically torn. Muscles and Tendons Flexor, peroneal and extensor compartment tendons are intact. Posterior tibialis tendinosis. Generalized T2 hyperintense signal throughout the visualized  plantar musculature is nonspecific and likely relates to chronic denervation changes. Soft tissue Focal soft tissue ulceration at the plantar midfoot, overlying the level of the lateral cuneiform and cuboid, with mild surrounding edema and enhancement, which could reflect cellulitis. No loculated fluid collection. Nonspecific  subcutaneous edema extending along the dorsal foot without significant associated enhancement. IMPRESSION: 1. Focal soft tissue ulceration at the plantar midfoot with mild surrounding edema and enhancement, which could reflect cellulitis. No abscess. 2. No evidence of acute osteomyelitis identified. 3. 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 4. Posterior tibialis tendinosis. Electronically Signed   By: Mannie Seek M.D.   On: 05/22/2024 12:25   ECHOCARDIOGRAM COMPLETE Result Date: 05/22/2024    ECHOCARDIOGRAM REPORT   Patient Name:   CHALES PELISSIER Date of Exam: 05/22/2024 Medical Rec #:  [id number]     Height:       67.9 in Accession #:    [id number]    Weight:       245.7 lb Date of Birth:  [birth year]     BSA:          2.229 m Patient Age:    63 years      BP:           157/78 mmHg Patient Gender: M             HR:           89 bpm. Exam Location:  Inpatient Procedure: 2D Echo, Color Doppler, Cardiac Doppler and Intracardiac            Opacification Agent (Both Spectral and Color Flow Doppler were            utilized during procedure). Indications:    Elevated Trop  History:        Patient has prior history of Echocardiogram examinations, most                 recent 05/22/2023.  Sonographer:    Hersey Lorenzo RDCS Referring Phys: [id number] JUSTIN B HOWERTER IMPRESSIONS  1. Left ventricular ejection fraction, by estimation, is 40 to 45%. The left ventricle has mildly decreased function. The left ventricle demonstrates global hypokinesis. There is moderate concentric left ventricular  hypertrophy. Left ventricular diastolic parameters are consistent with Grade I diastolic dysfunction (impaired relaxation).  2. Right ventricular systolic function is normal. The right ventricular size is normal.  3. The mitral valve is normal in structure. Trivial mitral valve regurgitation. No evidence of mitral stenosis.  4. The aortic valve is normal in structure. Aortic valve regurgitation is not visualized. No aortic stenosis is present.  5. There is borderline dilatation of the aortic root, measuring 38 mm. There is borderline dilatation of the ascending aorta, measuring 38 mm.  6. The inferior vena cava is normal in size with greater than 50% respiratory variability, suggesting right atrial pressure of 3 mmHg. FINDINGS  Left Ventricle: Left ventricular ejection fraction, by estimation, is 40 to 45%. The left ventricle has mildly decreased function. The left ventricle demonstrates global hypokinesis. Definity  contrast agent was given IV to delineate the left ventricular  endocardial borders. The left ventricular internal cavity size was normal in size. There is moderate concentric left ventricular hypertrophy. Left ventricular diastolic parameters are consistent with Grade I diastolic dysfunction (impaired relaxation). Right Ventricle: The right ventricular size is normal. No increase in right ventricular wall thickness. Right ventricular systolic function is normal. Left Atrium: Left atrial size was normal in size. Right Atrium: Right atrial size was normal in size. Pericardium: There is no evidence of pericardial effusion. Mitral Valve: The mitral valve is normal in structure. Trivial mitral valve regurgitation. No evidence of mitral valve stenosis. Tricuspid  Valve: The tricuspid valve is normal in structure. Tricuspid valve regurgitation is trivial. No evidence of tricuspid stenosis. Aortic Valve: The aortic valve is normal in structure. Aortic valve regurgitation is not visualized. No aortic stenosis is  present. Pulmonic Valve: The pulmonic valve was normal in structure. Pulmonic valve regurgitation is trivial. No evidence of pulmonic stenosis. Aorta: The aortic root is normal in size and structure. There is borderline dilatation of the aortic root, measuring 38 mm. There is borderline dilatation of the ascending aorta, measuring 38 mm. Venous: The inferior vena cava is normal in size with greater than 50% respiratory variability, suggesting right atrial pressure of 3 mmHg. IAS/Shunts: No atrial level shunt detected by color flow Doppler.  LEFT VENTRICLE PLAX 2D LVIDd:         5.10 cm      Diastology LVIDs:         4.80 cm      LV e' medial:    6.09 cm/s LV PW:         1.30 cm      LV E/e' medial:  16.7 LV IVS:        1.30 cm      LV e' lateral:   11.60 cm/s LVOT diam:     2.40 cm      LV E/e' lateral: 8.8 LV SV:         74 LV SV Index:   33 LVOT Area:     4.52 cm  LV Volumes (MOD) LV vol d, MOD A2C: 150.0 ml LV vol d, MOD A4C: 202.0 ml LV vol s, MOD A2C: 82.9 ml LV vol s, MOD A4C: 78.9 ml LV SV MOD A2C:     67.1 ml LV SV MOD A4C:     202.0 ml LV SV MOD BP:      96.7 ml RIGHT VENTRICLE         IVC TAPSE (M-mode): 2.0 cm  IVC diam: 2.20 cm LEFT ATRIUM             Index        RIGHT ATRIUM           Index LA diam:        3.70 cm 1.66 cm/m   RA Area:     24.70 cm LA Vol (A2C):   48.2 ml 21.63 ml/m  RA Volume:   85.10 ml  38.18 ml/m LA Vol (A4C):   50.0 ml 22.43 ml/m LA Biplane Vol: 49.4 ml 22.17 ml/m  AORTIC VALVE LVOT Vmax:   87.60 cm/s LVOT Vmean:  62.800 cm/s LVOT VTI:    0.163 m  AORTA Ao Root diam: 3.80 cm Ao Asc diam:  3.80 cm MITRAL VALVE MV Area (PHT): 4.99 cm     SHUNTS MV Decel Time: 152 msec     Systemic VTI:  0.16 m MV E velocity: 102.00 cm/s  Systemic Diam: 2.40 cm MV A velocity: 92.80 cm/s MV E/A ratio:  1.10 Jules Oar MD Electronically signed by Jules Oar MD Signature Date/Time: 05/22/2024/11:29:31 AM    Final    CT Angio Chest PE W/Cm &/Or Wo Cm Result Date: 05/21/2024 CLINICAL  DATA:  High probability for PE.  Shortness of breath. EXAM: CT ANGIOGRAPHY CHEST WITH CONTRAST TECHNIQUE: Multidetector CT imaging of the chest was performed using the standard protocol during bolus administration of intravenous contrast. Multiplanar CT image reconstructions and MIPs were obtained to evaluate the vascular anatomy. RADIATION DOSE REDUCTION: This exam was performed according  to the departmental dose-optimization program which includes automated exposure control, adjustment of the mA and/or kV according to patient size and/or use of iterative reconstruction technique. CONTRAST:  75mL OMNIPAQUE  IOHEXOL  350 MG/ML SOLN COMPARISON:  CT PE for call 10/29/2019 FINDINGS: Cardiovascular: The ascending aorta is dilated measuring 4 cm, unchanged. There is adequate opacification of the pulmonary arteries to the segmental level. There is no evidence for pulmonary embolism. Mediastinum/Nodes: No enlarged mediastinal, hilar, or axillary lymph nodes. Thyroid gland, trachea, and esophagus demonstrate no significant findings. Lungs/Pleura: There is some peripheral interstitial and ground-glass opacities in the bilateral lower lobes, nonspecific. There are minimal emphysematous changes per fluid in the left lower lobe. There is no pleural effusion or pneumothorax. There is a 2 mm right lower lobe pulmonary nodule image 6/61. Upper Abdomen: No acute abnormality. Musculoskeletal: No chest wall abnormality. No acute osseous findings. Review of the MIP images confirms the above findings. IMPRESSION: 1. No evidence for pulmonary embolism. 2. Stable dilatation of the ascending aorta measuring 4 cm. Recommend annual imaging followup by CTA or MRA. This recommendation follows 2010 ACCF/AHA/AATS/ACR/ASA/SCA/SCAI/SIR/STS/SVM Guidelines for the Diagnosis and Management of Patients with Thoracic Aortic Disease. Circulation. 2010; 121: Z610-R604. Aortic aneurysm NOS (ICD10-I71.9) 3. Peripheral interstitial and ground-glass opacities  in the bilateral lower lobes, nonspecific. Findings may be infectious/inflammatory. 4. 2 mm right solid pulmonary nodule. No follow-up needed if patient is low-risk.This recommendation follows the consensus statement: Guidelines for Management of Incidental Pulmonary Nodules Detected on CT Images: From the Fleischner Society 2017; Radiology 2017; 284:228-243. Electronically Signed   By: Tyron Gallon M.D.   On: 05/21/2024 17:08   DG Foot 2 Views Right Result Date: 05/21/2024 CLINICAL DATA:  Right foot wound. EXAM: RIGHT FOOT - 2 VIEW COMPARISON:  12/30/2021. FINDINGS: Soft tissue ulceration at the plantar midfoot. No definite evidence of acute osteolysis or erosive changes identified. 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 Moderate degenerative changes of the first and second MTP joints. Diffuse interphalangeal joint space narrowing. Degenerative changes of the tibiotalar and posterior subtalar joints. Plantar calcaneal spur. Subcutaneous edema of the foot, most pronounced at the plantar aspect. IMPRESSION: 1. Soft tissue ulceration at the plantar midfoot with surrounding subcutaneous edema. No definite evidence of acute osteolysis or erosive changes identified. 2. 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 3. Moderate degenerative changes of the hindfoot and forefoot. Electronically Signed   By: Mannie Seek M.D.   On: 05/21/2024 14:53   DG Chest 2 View Result Date: 05/21/2024 CLINICAL DATA:  Shortness of breath. EXAM: CHEST - 2 VIEW COMPARISON:  05/19/2023. FINDINGS: The heart size and mediastinal contours  are within normal limits. No focal consolidation, pleural effusion, or pneumothorax. Postsurgical changes of the left proximal humerus. No acute osseous abnormality. IMPRESSION: No acute cardiopulmonary findings. Electronically Signed   By: Mannie Seek M.D.   On: 05/21/2024 14:44    Microbiology: Recent Results (from the past 240 hours)  Resp panel by RT-PCR (RSV, Flu A&B, Covid) Anterior Nasal Swab     Status: None   Collection Time: 05/21/24  2:50 PM   Specimen: Anterior Nasal Swab  Result Value Ref Range Status   SARS Coronavirus 2 by RT PCR NEGATIVE NEGATIVE  Final   Influenza A by PCR NEGATIVE NEGATIVE Final   Influenza B by PCR NEGATIVE NEGATIVE Final    Comment: (NOTE) The Xpert Xpress SARS-CoV-2/FLU/RSV plus assay is intended as an aid in the diagnosis of influenza from Nasopharyngeal swab specimens and should not be used as a sole basis for treatment. Nasal washings and aspirates are unacceptable for Xpert Xpress SARS-CoV-2/FLU/RSV testing.  Fact Sheet for Patients: BloggerCourse.com  Fact Sheet for Healthcare Providers: SeriousBroker.it  This test is not yet approved or cleared by the [place]  FDA and has been authorized for detection and/or diagnosis of SARS-CoV-2 by FDA under an Emergency Use Authorization (EUA). This EUA will remain in effect (meaning this test can be used) for the duration of the COVID-19 declaration under Section 564(b)(1) of the Act, 21 U.S.C. section 360bbb-3(b)(1), unless the authorization is terminated or revoked.     Resp Syncytial Virus by PCR NEGATIVE NEGATIVE Final    Comment: (NOTE) Fact Sheet for Patients: BloggerCourse.com  Fact Sheet for Healthcare Providers: SeriousBroker.it  This test is not yet approved or cleared by the [place]  FDA and has been authorized for detection and/or diagnosis of SARS-CoV-2 by FDA under an  Emergency Use Authorization (EUA). This EUA will remain in effect (meaning this test can be used) for the duration of the COVID-19 declaration under Section 564(b)(1) of the Act, 21 U.S.C. section 360bbb-3(b)(1), unless the authorization is terminated or revoked.  Performed at [facility], [address], [place], [place] [address]      Labs: CBC: Recent Labs  Lab 05/26/24 0818 05/27/24 0440 05/28/24 0422 05/29/24 0421 05/30/24 0444  WBC 8.4 8.9 8.3 7.1 7.3  HGB 12.9* 12.5* 12.6* 12.4* 12.2*  HCT 38.7* 38.2* 37.9* 37.0* 37.9*  MCV 86.6 87.2 85.9 86.4 87.3  PLT 241 235 228 216 210   Basic Metabolic Panel: Recent Labs  Lab 05/26/24 0818 05/27/24 0440 05/28/24 0422 05/29/24 0421 05/30/24 0444  NA 132* 133* 133* 132* 134*  K 4.9 4.2 4.2 4.5 4.5  CL 101 98 97* 99 98  CO2 22 23 22 22 26   GLUCOSE 201* 215* 215* 193* 163*  BUN 26* 28* 35* 40* 43*  CREATININE 2.22* 2.27* 2.50* 2.60* 2.60*  CALCIUM  8.2* 8.6* 8.4* 8.1* 8.3*  MG 1.7 1.6* 1.9 2.0 2.1  PHOS  --   --  5.5*  --  4.9*   Liver Function Tests: No results for input(s): AST, ALT, ALKPHOS, BILITOT, PROT, ALBUMIN in the last 168 hours. No results for input(s): LIPASE, AMYLASE in the last 168 hours. No results for input(s): AMMONIA in the last 168 hours. Cardiac Enzymes: No results for input(s): CKTOTAL, CKMB, CKMBINDEX, TROPONINI in the last 168 hours. BNP (last 3 results) Recent Labs    05/21/24 1226 05/22/24 0549 05/25/24 0321  BNP 100.3* 93.6 68.1   CBG: Recent Labs  Lab 05/29/24 1211 05/29/24 1555 05/29/24 1945 05/30/24 0738 05/30/24 1201  GLUCAP 243* 285* 282* 179* 302*    Time spent: 35 minutes  Signed:  Althia Atlas  [facility] 05/30/2024 2:14 PM

## 2024-05-30 NOTE — Plan of Care (Signed)

## 2024-05-30 NOTE — Progress Notes (Signed)
 [facility] Progress Note  Subjective:  He had 2.4 liters UOP over 6/11 as well as one unmeasured urine void.   He would really like to go home.  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    Review of systems:     He states shortness of breath is improved Denies n/v No chest pain    Vitals:   05/30/24 0501 05/30/24 0741 05/30/24 0845 05/30/24 0853  BP: 111/89 (!) 140/74    Pulse: 90 88  95  Resp: 20 18  16   Temp: (!) 97.5 F (36.4 C) 98.1 F (36.7 C)    TempSrc:      SpO2: 99% 97% 97% 97%  Weight: 115.5 kg     Height:        Physical Exam:      General adult male in bed in no acute distress HEENT normocephalic atraumatic extraocular movements intact sclera anicteric Neck supple trachea midline Lungs clear to auscultation bilaterally normal work of breathing at rest on room air Heart S1S2 no rub Abdomen soft nontender nondistended Extremities left AKA; RLE with 2+ edema, softer Psych normal mood and affect Neuro alert and oriented x 3 provides hx and follows commands         Renal-related home meds: Toprol  xl 12.5 daily Others: zofran , Spiriva , PPI, insulin  x 2, gabapentin , statin, asa   Date                             Creat               eGFR (ml/min) 2020- 2022                  0.64- 0.85 2023                            1.02                 > 60  5/31- 05/25/23               1.68 >> 1.35    46- 60 ml/min   05/21/24                        1.56                 50 ml/min         6/04                             1.68 6/05                             2.03 6/06                             2.18                              05/25/24                        2.25     UA 05/21/24 ->  glu > 500, small Hb, prot > 300, 0-5 rbc/wbc/epi UA 05/19/23 -> glu >  500, ket 5, prot > 300, 0-5 epi/wbc/rbc Renal US  05/24/24 --> 11.4/ 13.4 cm kidneys w/ no hydro and normal echotexture CXR 6/03 - no active disease CTA chest 6/03: IMPRESSION: Peripheral  interstitial and ground-glass opacities in the bilateral lower lobes, nonspecific. Findings may be infectious/inflammatory BP here 164/92 on admission, no hypotension episodes < 120 while here     Assessment/ Plan: AKI on CKD 3a: AKI secondary to contrast and diabetic CKD stage 3a.  Baseline Creatinine 1.3- 1.5 from mid 2024 and 05/21/24, eGFR 50- 60 ml/min. Creat here was 1.5 on admission, and has been rising daily since up to low 2's. Pt rec'd IV contrast w/ CTA chest on 6/03, no other nephrotoxins, no hypotension. Renal US  w/o obstruction. UA +protein, no rbcs/ wbcs. C/o orthopnea.  CXR w/o edema.    Lasix  80 mg IV BID today while here.  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  Not on a diuretic outpatient.   Would start lasix  80 mg PO BID on discharge.  Discussed with primary team  To optimize him I will also give metolazone 5 mg PO once today Ordered strict ins/outs  I discussed his fluid restriction of 1.2 liters a day with him Daily weights are ordered Shortness of breath may be in part related to body habitus.  Edema much softer thankfully   R foot infection/ cellulitis: no osteo per MRI, per primary team  CKD stage 3a - BL Cr 1.3 -1.5.  Due to diabetic kidney disease DM type 2: long-standing.  Per primary team  Hypomag - improved with repletion   Disposition - patient is hoping for discharge today which is reasonable from a strictly renal standpoint.  I have requested follow-up with [facility] with me or  an extender in one week from discharge and I have asked schedulers to reach out to let me know if any difficulty in scheduling this      Recent Labs  Lab 05/28/24 0422 05/29/24 0421 05/30/24 0444  HGB 12.6* 12.4* 12.2*  CALCIUM  8.4* 8.1* 8.3*  PHOS 5.5*  --  4.9*  CREATININE 2.50* 2.60* 2.60*  K 4.2 4.5 4.5   Recent Labs  Lab 05/29/24 0421  IRON 76  TIBC 294   Inpatient medications:  aspirin  EC  81 mg Oral Daily   atorvastatin   40 mg  Oral Daily   [START ON 05/31/2024] vitamin B-12  1,000 mcg Oral Daily   enoxaparin  (LOVENOX ) injection  55 mg Subcutaneous Q24H   furosemide   80 mg Intravenous BID   gabapentin   300 mg Oral BID   insulin  aspart  0-15 Units Subcutaneous TID WC   insulin  aspart  0-5 Units Subcutaneous QHS   insulin  aspart  6 Units Subcutaneous TID WC   insulin  glargine-yfgn  56 Units Subcutaneous BID   metoprolol  succinate  12.5 mg Oral Daily   pantoprazole   40 mg Oral BID   umeclidinium bromide   1 puff Inhalation Daily   Vitamin D (Ergocalciferol)  50,000 Units Oral Q7 days     acetaminophen  **OR** acetaminophen , benzocaine, fentaNYL  (SUBLIMAZE ) injection, glucagon  (human recombinant), guaiFENesin , hydrALAZINE , ipratropium-albuterol , labetalol , melatonin, metoprolol  tartrate, naLOXone  (NARCAN )  injection, ondansetron  (ZOFRAN ) IV, senna-docusate, traZODone      Nan Aver. MD 1:10 PM 05/30/2024

## 2024-05-30 NOTE — Care Management (Deleted)
  Transition of Care [facility]) Screening Note   Patient Details  Name: VALLIE TETERS Date of Birth: [birth year]   Transition of Care [facility]) CM/SW Contact:    Ronni Colace, RN Phone Number: 05/30/2024, 4:58 PM    Transition of Care Department [facility]) has reviewed patient and no TOC needs have been identified at this time. We will continue to monitor patient advancement through interdisciplinary progression rounds. If new patient transition needs arise, please place a TOC consult.

## 2024-05-30 NOTE — Inpatient Diabetes Management (Signed)
 Inpatient Diabetes Program Recommendations  AACE/ADA: New Consensus Statement on Inpatient Glycemic Control   Target Ranges:  Prepandial:   less than 140 mg/dL      Peak postprandial:   less than 180 mg/dL (1-2 hours)      Critically ill patients:  140 - 180 mg/dL    Latest Reference Range & Units 05/29/24 08:47 05/29/24 12:11 05/29/24 15:55 05/29/24 19:45 05/30/24 07:38  Glucose-Capillary 70 - 99 mg/dL 829 (H) 562 (H) 130 (H) 282 (H) 179 (H)   Review of Glycemic Control  Diabetes history: DM2 Outpatient Diabetes medications: Lantus  60 units BID, Novolog  6-10 units TID with meals Current orders for Inpatient glycemic control: Semglee  56 units BID, Novolog  6 units TID with meals, Novolog  0-15 units TID with meals, Novolog  0-5 units QHS  Inpatient Diabetes Program Recommendations:    Insulin : Please consider increasing meal coverage to Novolog  10 units TID with meals.  Thanks, Beacher Limerick, RN, MSN, CDCES Diabetes Coordinator Inpatient Diabetes Program [phone] (Team Pager from 8am to 5pm)

## 2024-05-30 NOTE — Progress Notes (Signed)
 AVS completed for discharge packet and placed with chart.

## 2024-05-30 NOTE — Plan of Care (Signed)

## 2024-05-31 ENCOUNTER — Other Ambulatory Visit (HOSPITAL_COMMUNITY): Payer: Self-pay

## 2024-08-23 ENCOUNTER — Other Ambulatory Visit: Payer: Self-pay

## 2024-08-23 ENCOUNTER — Emergency Department (HOSPITAL_COMMUNITY)

## 2024-08-23 ENCOUNTER — Emergency Department (HOSPITAL_COMMUNITY)
Admission: EM | Admit: 2024-08-23 | Discharge: 2024-08-24 | Disposition: A | Discharge status: Home / Self Care | Attending: Emergency Medicine | Admitting: Emergency Medicine

## 2024-08-23 DIAGNOSIS — E871 Hypo-osmolality and hyponatremia: Secondary | ICD-10-CM | POA: Insufficient documentation

## 2024-08-23 DIAGNOSIS — I2510 Atherosclerotic heart disease of native coronary artery without angina pectoris: Secondary | ICD-10-CM | POA: Insufficient documentation

## 2024-08-23 DIAGNOSIS — N289 Disorder of kidney and ureter, unspecified: Secondary | ICD-10-CM | POA: Insufficient documentation

## 2024-08-23 DIAGNOSIS — Z7982 Long term (current) use of aspirin: Secondary | ICD-10-CM | POA: Insufficient documentation

## 2024-08-23 DIAGNOSIS — E1165 Type 2 diabetes mellitus with hyperglycemia: Secondary | ICD-10-CM | POA: Diagnosis not present

## 2024-08-23 DIAGNOSIS — E11621 Type 2 diabetes mellitus with foot ulcer: Secondary | ICD-10-CM | POA: Diagnosis not present

## 2024-08-23 DIAGNOSIS — I10 Essential (primary) hypertension: Secondary | ICD-10-CM | POA: Diagnosis not present

## 2024-08-23 DIAGNOSIS — R739 Hyperglycemia, unspecified: Secondary | ICD-10-CM

## 2024-08-23 DIAGNOSIS — L97512 Non-pressure chronic ulcer of other part of right foot with fat layer exposed: Secondary | ICD-10-CM | POA: Insufficient documentation

## 2024-08-23 DIAGNOSIS — E08621 Diabetes mellitus due to underlying condition with foot ulcer: Secondary | ICD-10-CM

## 2024-08-23 DIAGNOSIS — R519 Headache, unspecified: Secondary | ICD-10-CM

## 2024-08-23 DIAGNOSIS — Z79899 Other long term (current) drug therapy: Secondary | ICD-10-CM | POA: Insufficient documentation

## 2024-08-23 DIAGNOSIS — R42 Dizziness and giddiness: Secondary | ICD-10-CM | POA: Diagnosis present

## 2024-08-23 DIAGNOSIS — Z794 Long term (current) use of insulin: Secondary | ICD-10-CM | POA: Insufficient documentation

## 2024-08-23 LAB — CBC WITH DIFFERENTIAL/PLATELET
Abs Immature Granulocytes: 0.11 K/uL — ABNORMAL HIGH (ref 0.00–0.07)
Basophils Absolute: 0.1 K/uL (ref 0.0–0.1)
Basophils Relative: 1 %
Eosinophils Absolute: 0.3 K/uL (ref 0.0–0.5)
Eosinophils Relative: 3 %
HCT: 38.8 % — ABNORMAL LOW (ref 39.0–52.0)
Hemoglobin: 13.2 g/dL (ref 13.0–17.0)
Immature Granulocytes: 1 %
Lymphocytes Relative: 29 %
Lymphs Abs: 2.4 K/uL (ref 0.7–4.0)
MCH: 28 pg (ref 26.0–34.0)
MCHC: 34 g/dL (ref 30.0–36.0)
MCV: 82.4 fL (ref 80.0–100.0)
Monocytes Absolute: 0.6 K/uL (ref 0.1–1.0)
Monocytes Relative: 7 %
Neutro Abs: 4.9 K/uL (ref 1.7–7.7)
Neutrophils Relative %: 59 %
Platelets: 227 K/uL (ref 150–400)
RBC: 4.71 MIL/uL (ref 4.22–5.81)
RDW: 13.3 % (ref 11.5–15.5)
WBC: 8.3 K/uL (ref 4.0–10.5)
nRBC: 0 % (ref 0.0–0.2)

## 2024-08-23 LAB — C-REACTIVE PROTEIN: CRP: 6.2 mg/dL — ABNORMAL HIGH (ref <1.0)

## 2024-08-23 LAB — CBG MONITORING, ED: Glucose-Capillary: 410 mg/dL — ABNORMAL HIGH (ref 70–99)

## 2024-08-23 MED ORDER — ASPIRIN 81 MG PO CHEW
324.0000 mg | CHEWABLE_TABLET | Freq: Once | ORAL | Status: AC
  Administered 2024-08-23: 324 mg via ORAL
  Filled 2024-08-23: qty 4

## 2024-08-23 MED ORDER — MECLIZINE HCL 25 MG PO TABS
25.0000 mg | ORAL_TABLET | Freq: Once | ORAL | Status: AC
  Administered 2024-08-23: 25 mg via ORAL
  Filled 2024-08-23: qty 1

## 2024-08-23 NOTE — ED Provider Notes (Signed)
 [place] EMERGENCY DEPARTMENT AT [facility] Provider Note   CSN: [id number] Arrival date & time: 08/23/24  2254     Patient presents with: No chief complaint on file.   Samuel Bailey is a 63 y.o. male.   The history is provided by the patient.   He has a history of hypertension, diabetes, hyperlipidemia, coronary artery disease and comes in complaining of 1 week history of dizziness, tendency to fall to the left, intermittent chest pain, shortness of breath, nausea and vomiting, facial numbness.  Symptoms have been stable over the last week.  He denies fever or chills.  He denies diaphoresis.  Of note, he does have a chronic ulcer on his right foot which is being treated by his primary care provider.    Prior to Admission medications   Medication Sig Start Date End Date Taking? Authorizing Provider  acetaminophen  (TYLENOL ) 325 MG tablet Take 2 tablets (650 mg total) by mouth every 6 (six) hours as needed for mild pain, fever or headache. 05/25/23   Danton Reyes DASEN, MD  aspirin  81 MG EC tablet Take 1 tablet (81 mg total) by mouth daily. 03/29/21   Tobb, Kardie, DO  atorvastatin  (LIPITOR) 40 MG tablet Take 1 tablet (40 mg total) by mouth daily. 03/29/21   Tobb, Kardie, DO  B-D ULTRAFINE III SHORT PEN 31G X 8 MM MISC Inject into the skin daily. 05/31/21   [provider]  cyanocobalamin  1000 MCG tablet Take 1 tablet (1,000 mcg total) by mouth daily. 05/31/24 08/29/24  Von Bellis, MD  furosemide  (LASIX ) 80 MG tablet Take 1 tablet (80 mg total) by mouth 2 (two) times daily. 05/31/24 08/29/24  Von Bellis, MD  gabapentin  (NEURONTIN ) 300 MG capsule Take 1 capsule (300 mg total) by mouth 2 (two) times daily. 05/30/24   Von Bellis, MD  insulin  aspart (NOVOLOG ) 100 UNIT/ML injection Inject 10 Units into the skin 3 (three) times daily with meals. Patient taking differently: Inject 6-10 Units into the skin 3 (three) times daily with meals. 05/25/23   Danton Reyes DASEN, MD   insulin  glargine (LANTUS ) 100 unit/mL SOPN Inject 75 Units into the skin 2 (two) times daily. Patient taking differently: Inject 60 Units into the skin 2 (two) times daily. 05/25/23   Danton Reyes DASEN, MD  metoprolol  succinate (TOPROL -XL) 25 MG 24 hr tablet TAKE 1/2 TABLET (12.5 MG TOTAL) BY MOUTH DAILY. 05/25/23 05/24/24  Danton Reyes DASEN, MD  omeprazole (PRILOSEC) 40 MG capsule Take 40 mg by mouth in the morning and at bedtime.     [provider]  ondansetron  (ZOFRAN ) 4 MG tablet Take 1 tablet (4 mg total) by mouth every 8 (eight) hours as needed for nausea or vomiting. 06/12/23   Luiz Channel, MD  senna-docusate (SENOKOT-S) 8.6-50 MG tablet Take 1 tablet by mouth daily. 05/26/23   Danton Reyes DASEN, MD  tiotropium (SPIRIVA ) 18 MCG inhalation capsule Place 18 mcg into inhaler and inhale daily.    [provider]  Vitamin D , Ergocalciferol , (DRISDOL ) 1.25 MG (50000 UNIT) CAPS capsule Take 1 capsule (50,000 Units total) by mouth every 7 (seven) days. 06/06/24 09/04/24  Von Bellis, MD    Allergies: Lovastatin    Review of Systems  All other systems reviewed and are negative.   Updated Vital Signs There were no vitals taken for this visit.  Physical Exam Vitals and nursing note reviewed.   63 year old male, resting comfortably and in no acute distress. Vital signs are significant  for elevated blood pressure. Oxygen saturation is 100%, which is normal. Head is normocephalic and atraumatic. PERRLA, EOMI. Oropharynx is clear. Neck is nontender and supple without adenopathy or JVD.  There are no carotid bruits. Lungs are clear without rales, wheezes, or rhonchi. Chest is nontender. Heart has regular rate and rhythm without murmur. Abdomen is soft, flat, nontender. Extremities: Left above-the-knee amputation.  Clean appearing ulcer on the plantar surface of the right foot.  Deformity of right foot consistent with Charcot joint.  There is 1+ pretibial edema with some mild  erythema. Skin is warm and dry without other rash. Neurologic: Awake and alert, mild left central facial droop, strength 5/5 in all 4 extremities, no pronator drift, sensory exam symmetric throughout including all 3 divisions of the trigeminal nerve, no extinction on double simultaneous stimulation.  Dizziness is reproduced by passive head movement.   (all labs ordered are listed, but only abnormal results are displayed) Labs Reviewed  CBC WITH DIFFERENTIAL/PLATELET - Abnormal; Notable for the following components:      Result Value   HCT 38.8 (*)    Abs Immature Granulocytes 0.11 (*)    All other components within normal limits  COMPREHENSIVE METABOLIC PANEL WITH GFR - Abnormal; Notable for the following components:   Sodium 129 (*)    CO2 17 (*)    Glucose, Bld 431 (*)    Creatinine, Ser 1.81 (*)    Calcium  8.8 (*)    Total Protein 6.1 (*)    Albumin 2.3 (*)    GFR, Estimated 42 (*)    All other components within normal limits  SEDIMENTATION RATE - Abnormal; Notable for the following components:   Sed Rate 53 (*)    All other components within normal limits  C-REACTIVE PROTEIN - Abnormal; Notable for the following components:   CRP 6.2 (*)    All other components within normal limits  URINALYSIS, W/ REFLEX TO CULTURE (INFECTION SUSPECTED) - Abnormal; Notable for the following components:   Color, Urine STRAW (*)    Glucose, UA >=500 (*)    Protein, ur >=300 (*)    All other components within normal limits  CBG MONITORING, ED - Abnormal; Notable for the following components:   Glucose-Capillary 410 (*)    All other components within normal limits  I-STAT VENOUS BLOOD GAS, ED - Abnormal; Notable for the following components:   pCO2, Ven 32.6 (*)    pO2, Ven 71 (*)    Bicarbonate 19.5 (*)    TCO2 20 (*)    Acid-base deficit 5.0 (*)    Sodium 129 (*)    Calcium , Ion 1.12 (*)    HCT 35.0 (*)    Hemoglobin 11.9 (*)    All other components within normal limits  TROPONIN I  (HIGH SENSITIVITY) - Abnormal; Notable for the following components:   Troponin I (High Sensitivity) 21 (*)    All other components within normal limits  BETA-HYDROXYBUTYRIC ACID    EKG: EKG Interpretation Date/Time:  Friday August 23 2024 23:13:09 EDT Ventricular Rate:  96 PR Interval:  184 QRS Duration:  92 QT Interval:  361 QTC Calculation: 457 R Axis:   14  Text Interpretation: Sinus rhythm Consider left atrial enlargement Abnormal R-wave progression, early transition Baseline wander in lead(s) V1 Nonspecific ST elevation Anterior leads When compared with ECG of 05/22/2024, Nonspecific ST abnormality is now present Confirmed by Raford Lenis ([id number]) on 08/23/2024 11:26:50 PM  Radiology: MR BRAIN WO CONTRAST Result Date: 08/24/2024 CLINICAL DATA:  Neuro deficit, acute, stroke suspected EXAM: MRI HEAD WITHOUT CONTRAST TECHNIQUE: Multiplanar, multiecho pulse sequences of the brain and surrounding structures were obtained without intravenous contrast. COMPARISON:  CT head 08/23/2024. FINDINGS: Brain: No acute infarction, hemorrhage, hydrocephalus, extra-axial collection or mass lesion. Small remote left basal ganglia lacunar infarct. Vascular: Normal flow voids. Skull and upper cervical spine: Normal marrow signal. Sinuses/Orbits: Clear sinuses.  No acute orbital findings. Other: No mastoid effusions. IMPRESSION: No evidence of acute intracranial abnormality. Electronically Signed   By: Gilmore GORMAN Molt M.D.   On: 08/24/2024 02:34   CT Head Wo Contrast Result Date: 08/23/2024 CLINICAL DATA:  Neuro deficit, acute, stroke suspected EXAM: CT HEAD WITHOUT CONTRAST TECHNIQUE: Contiguous axial images were obtained from the base of the skull through the vertex without intravenous contrast. RADIATION DOSE REDUCTION: This exam was performed according to the departmental dose-optimization program which includes automated exposure control, adjustment of the mA and/or kV according to patient size and/or use  of iterative reconstruction technique. COMPARISON:  CT head 01/08/2008 FINDINGS: Brain: No evidence of acute large vascular territory infarction, hemorrhage, hydrocephalus, extra-axial collection or mass lesion/mass effect. Vascular: No hyperdense vessel. Skull: No acute fracture. Sinuses/Orbits: Clear sinuses.  No acute orbital findings. Other: No mastoid effusions. IMPRESSION: No evidence of acute intracranial abnormality. Electronically Signed   By: Gilmore GORMAN Molt M.D.   On: 08/23/2024 23:48   DG Foot 2 Views Right Result Date: 08/23/2024 CLINICAL DATA:  Foot ulceration, assess for osteomyelitis EXAM: RIGHT FOOT - 2 VIEW COMPARISON:  05/21/2024 FINDINGS: Frontal and lateral views of the right foot are obtained. Continued subcutaneous edema throughout the right foot, greatest within the plantar aspect of the midfoot. No subcutaneous gas or radiopaque foreign body. There are no acute or destructive bony abnormalities. Continued findings of Charcot arthropathy with joint space narrowing and osteophyte formation greatest in the midfoot. Pes planus deformity. Stable inferior calcaneal spur. IMPRESSION: 1. Plantar soft tissue swelling throughout the right foot. 2. No acute or destructive bony abnormalities. No radiographic evidence of osteomyelitis. 3. Stable Charcot arthropathy and pes planus deformity. Electronically Signed   By: Ozell Daring M.D.   On: 08/23/2024 23:35   DG Chest Portable 1 View Result Date: 08/23/2024 CLINICAL DATA:  Foot ulcer EXAM: PORTABLE CHEST 1 VIEW COMPARISON:  05/28/2024 FINDINGS: Heart and mediastinal contours are within normal limits. No focal opacities or effusions. No acute bony abnormality. IMPRESSION: No active disease. Electronically Signed   By: Franky Crease M.D.   On: 08/23/2024 23:33     Procedures  Cardiac monitor shows normal sinus rhythm, per my interpretation. Medications Ordered in the ED - No data to display                                  Medical  Decision Making Amount and/or Complexity of Data Reviewed Labs: ordered. Radiology: ordered.  Risk OTC drugs. Prescription drug management.   Dizziness which may be peripheral vertigo versus central vertigo.  Facial droop but no other signs of stroke, suspect that it is old.  Multiple vague complaints.  I do not see evidence of an acute stroke although it is possible that dizziness is a posterior circulation stroke.  Ulcer on the right foot which could be source for osteomyelitis but no other physical findings suggestive of that.  I have ordered screening labs and CT of head and x-ray of right foot.  May need MRIs to fully evaluate.  I have reviewed his electrocardiogram, and my interpretation is nonspecific ST changes which are new compared with 6//2025.  I have reviewed his laboratory tests, and my interpretation is normal CBC, mildly elevated troponin which is at his baseline, moderate hyperglycemia, mild hyponatremia at a level consistent with degree of hyperglycemia, low CO2 with normal anion gap, stable renal insufficiency, elevated sed rate and CRP.  Because of low CO2, I was concerned about occult ketoacidosis so I ordered beta hydroxy butyrate level and venous blood gases both of which were normal-no evidence of ketoacidosis.  Urinalysis is significant only for glycosuria.  X-ray of the right foot showed no evidence of osteomyelitis, chest x-ray shows no active cardiopulmonary disease.  CT of head shows no acute intracranial abnormality.  I have independently viewed the images, and agree with the radiologist's interpretation.  I have ordered MRI of the brain to rule out occult stroke, MRI of the foot to look for evidence of osteomyelitis.  MRI of the brain shows no acute intracranial abnormality, no evidence of stroke.  MRI of the foot shows no evidence of osteomyelitis.  I have independently viewed all of those images, and agree with the radiologist's interpretation.  Dizziness is slightly  better following meclizine .  He is continue to complain of left facial pain.  I am concerned about possible early herpes zoster.  I am discharging with prescription for meclizine , told him to take over-the-counter acetaminophen  as needed for pain.  I have recommended follow-up with PCP in the next several days.  I have advised him to seek medical care immediately if he develops a rash on his face.     Final diagnoses:  Dizziness  Left facial pain  Diabetic ulcer of right foot associated with diabetes mellitus due to underlying condition, with fat layer exposed, unspecified part of foot (HCC)  Hyperglycemia  Renal insufficiency  Hyponatremia    ED Discharge Orders          Ordered    meclizine  (ANTIVERT ) 25 MG tablet  3 times daily PRN        08/24/24 0718               Raford Lenis, MD 08/24/24 0725

## 2024-08-23 NOTE — ED Triage Notes (Signed)
 PT BIB [employer], from home. Called for family for possible stroke--negative stroke screening by EMS. EMS reports pt has had L sided pain in jaw, shoulder. 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 Diabetic ulcer present on R plantar of foot. Pt has not been following up with wound care. Patient also has diagnosis of cellulitis on R shin, but has not taken abx as prescribed.  VS: 198/102 BP 98 HR 20RR 98% RA  CBG 460  Patient A&Ox4 at arrival.

## 2024-08-23 NOTE — ED Notes (Addendum)
 Pt onset of numbness on L side ongoing for 1 week. Headache present x1 week. L sided facial dropp present at arrival.

## 2024-08-23 NOTE — ED Notes (Signed)
With CT 

## 2024-08-24 ENCOUNTER — Emergency Department (HOSPITAL_COMMUNITY)

## 2024-08-24 LAB — SEDIMENTATION RATE: Sed Rate: 53 mm/h — ABNORMAL HIGH (ref 0–16)

## 2024-08-24 LAB — URINALYSIS, W/ REFLEX TO CULTURE (INFECTION SUSPECTED)
Bacteria, UA: NONE SEEN
Bilirubin Urine: NEGATIVE
Glucose, UA: >=500 mg/dL — AB
Hgb urine dipstick: NEGATIVE
Ketones, ur: NEGATIVE mg/dL
Leukocytes,Ua: NEGATIVE
Nitrite: NEGATIVE
Protein, ur: >=300 mg/dL — AB
Specific Gravity, Urine: 1.018 (ref 1.005–1.030)
pH: 5 (ref 5.0–8.0)

## 2024-08-24 LAB — COMPREHENSIVE METABOLIC PANEL WITH GFR
ALT: 16 U/L (ref 0–44)
AST: 21 U/L (ref 15–41)
Albumin: 2.3 g/dL — ABNORMAL LOW (ref 3.5–5.0)
Alkaline Phosphatase: 95 U/L (ref 38–126)
Anion gap: 12 (ref 5–15)
BUN: 22 mg/dL (ref 8–23)
CO2: 17 mmol/L — ABNORMAL LOW (ref 22–32)
Calcium: 8.8 mg/dL — ABNORMAL LOW (ref 8.9–10.3)
Chloride: 100 mmol/L (ref 98–111)
Creatinine, Ser: 1.81 mg/dL — ABNORMAL HIGH (ref 0.61–1.24)
GFR, Estimated: 42 mL/min — ABNORMAL LOW (ref >60)
Glucose, Bld: 431 mg/dL — ABNORMAL HIGH (ref 70–99)
Potassium: 4.3 mmol/L (ref 3.5–5.1)
Sodium: 129 mmol/L — ABNORMAL LOW (ref 135–145)
Total Bilirubin: 1 mg/dL (ref 0.0–1.2)
Total Protein: 6.1 g/dL — ABNORMAL LOW (ref 6.5–8.1)

## 2024-08-24 LAB — TROPONIN I (HIGH SENSITIVITY): Troponin I (High Sensitivity): 21 ng/L — ABNORMAL HIGH (ref <18)

## 2024-08-24 LAB — I-STAT VENOUS BLOOD GAS, ED
Acid-base deficit: 5 mmol/L — ABNORMAL HIGH (ref 0.0–2.0)
Bicarbonate: 19.5 mmol/L — ABNORMAL LOW (ref 20.0–28.0)
Calcium, Ion: 1.12 mmol/L — ABNORMAL LOW (ref 1.15–1.40)
HCT: 35 % — ABNORMAL LOW (ref 39.0–52.0)
Hemoglobin: 11.9 g/dL — ABNORMAL LOW (ref 13.0–17.0)
O2 Saturation: 94 %
Potassium: 4.4 mmol/L (ref 3.5–5.1)
Sodium: 129 mmol/L — ABNORMAL LOW (ref 135–145)
TCO2: 20 mmol/L — ABNORMAL LOW (ref 22–32)
pCO2, Ven: 32.6 mmHg — ABNORMAL LOW (ref 44–60)
pH, Ven: 7.384 (ref 7.25–7.43)
pO2, Ven: 71 mmHg — ABNORMAL HIGH (ref 32–45)

## 2024-08-24 LAB — BETA-HYDROXYBUTYRIC ACID: Beta-Hydroxybutyric Acid: 0.19 mmol/L (ref 0.05–0.27)

## 2024-08-24 MED ORDER — MECLIZINE HCL 25 MG PO TABS: 25.0000 mg | ORAL_TABLET | Freq: Three times a day (TID) | ORAL | 0 refills | Status: AC | PRN

## 2024-08-24 MED ORDER — GADOBUTROL 1 MMOL/ML IV SOLN
10.0000 mL | Freq: Once | INTRAVENOUS | Status: AC | PRN
  Administered 2024-08-24: 10 mL via INTRAVENOUS

## 2024-08-24 NOTE — ED Notes (Signed)
 Patient is resting in bed.

## 2024-08-24 NOTE — Discharge Instructions (Addendum)
 Your evaluation did not show any sign of a stroke and it did not show any infection getting into the bones in your foot.  Take the meclizine  as needed for your dizziness.  Take acetaminophen  as needed for pain.  Please avoid ibuprofen and naproxen because these can make your kidneys worse.  Please follow-up with your primary care provider for reevaluation.  Return to the emergency department if symptoms are worsening.  If you break out in a rash where your face is hurting, seek medical attention immediately.  That could be shingles and there is medication that is very specific for shingles that would need to be initiated as soon as possible.

## 2024-08-24 NOTE — ED Notes (Signed)
 Patients foot was cleaned with normal saline. Nonadherent guaze was placed and then the foot was wrapped with guaze. Patient was given something to eat and drink.

## 2024-08-24 NOTE — ED Notes (Signed)
 Pt wheeled to waiting room at this time. Verbalized understanding of discharge paperwork and follow-up.

## 2024-08-24 NOTE — ED Notes (Signed)
With MRI
